# Patient Record
Sex: Female | Born: 1955
Health system: Southern US, Community
[De-identification: ages and names within clinical notes are randomized; demographics above are authoritative.]

## PROBLEM LIST (undated history)

## (undated) DIAGNOSIS — F411 Generalized anxiety disorder: Secondary | ICD-10-CM

## (undated) DIAGNOSIS — I671 Cerebral aneurysm, nonruptured: Secondary | ICD-10-CM

## (undated) DIAGNOSIS — F17200 Nicotine dependence, unspecified, uncomplicated: Secondary | ICD-10-CM

## (undated) DIAGNOSIS — K743 Primary biliary cirrhosis: Secondary | ICD-10-CM

## (undated) DIAGNOSIS — G935 Compression of brain: Secondary | ICD-10-CM

## (undated) DIAGNOSIS — G43909 Migraine, unspecified, not intractable, without status migrainosus: Secondary | ICD-10-CM

## (undated) DIAGNOSIS — F329 Major depressive disorder, single episode, unspecified: Secondary | ICD-10-CM

## (undated) DIAGNOSIS — D649 Anemia, unspecified: Secondary | ICD-10-CM

## (undated) DIAGNOSIS — T8859XA Other complications of anesthesia, initial encounter: Secondary | ICD-10-CM

## (undated) DIAGNOSIS — F419 Anxiety disorder, unspecified: Secondary | ICD-10-CM

## (undated) DIAGNOSIS — Z86010 Personal history of colonic polyps: Secondary | ICD-10-CM

## (undated) HISTORY — DX: Major depressive disorder, single episode, unspecified: F32.9

## (undated) HISTORY — DX: Cerebral aneurysm, nonruptured: I67.1

## (undated) HISTORY — DX: Personal history of colonic polyps: Z86.010

## (undated) HISTORY — DX: Primary biliary cirrhosis: K74.3

## (undated) HISTORY — DX: Generalized anxiety disorder: F41.1

## (undated) HISTORY — DX: Migraine, unspecified, not intractable, without status migrainosus: G43.909

## (undated) HISTORY — DX: Nicotine dependence, unspecified, uncomplicated: F17.200

## (undated) HISTORY — PX: APPENDECTOMY: SHX54

## (undated) HISTORY — PX: BREAST EXCISIONAL BIOPSY: SUR124

## (undated) HISTORY — DX: Anxiety disorder, unspecified: F41.9

## (undated) HISTORY — DX: Compression of brain: G93.5

## (undated) HISTORY — PX: BRAIN SURGERY: SHX531

## (undated) HISTORY — PX: TONSILLECTOMY AND ADENOIDECTOMY: SUR1326

## (undated) HISTORY — PX: KNEE ARTHROSCOPY: SHX127

---

## 1998-08-08 ENCOUNTER — Other Ambulatory Visit: Admission: RE | Admit: 1998-08-08 | Discharge: 1998-08-08 | Payer: Self-pay | Admitting: Obstetrics and Gynecology

## 2000-07-02 ENCOUNTER — Other Ambulatory Visit: Admission: RE | Admit: 2000-07-02 | Discharge: 2000-07-02 | Payer: Self-pay | Admitting: *Deleted

## 2000-11-18 ENCOUNTER — Ambulatory Visit (HOSPITAL_COMMUNITY): Admission: RE | Admit: 2000-11-18 | Discharge: 2000-11-18 | Payer: Self-pay | Admitting: *Deleted

## 2000-11-18 ENCOUNTER — Encounter: Payer: Self-pay | Admitting: *Deleted

## 2000-11-21 ENCOUNTER — Encounter: Admission: RE | Admit: 2000-11-21 | Discharge: 2000-11-21 | Payer: Self-pay | Admitting: *Deleted

## 2000-11-21 ENCOUNTER — Encounter: Payer: Self-pay | Admitting: *Deleted

## 2001-03-27 ENCOUNTER — Inpatient Hospital Stay (HOSPITAL_COMMUNITY): Admission: EM | Admit: 2001-03-27 | Discharge: 2001-03-31 | Payer: Self-pay | Admitting: Emergency Medicine

## 2001-03-28 ENCOUNTER — Encounter: Payer: Self-pay | Admitting: Neurosurgery

## 2001-03-30 ENCOUNTER — Encounter: Payer: Self-pay | Admitting: Neurosurgery

## 2001-04-15 ENCOUNTER — Inpatient Hospital Stay (HOSPITAL_COMMUNITY): Admission: RE | Admit: 2001-04-15 | Discharge: 2001-04-24 | Payer: Self-pay | Admitting: Neurosurgery

## 2001-04-15 ENCOUNTER — Encounter: Payer: Self-pay | Admitting: Neurosurgery

## 2001-04-16 ENCOUNTER — Encounter: Payer: Self-pay | Admitting: Neurosurgery

## 2001-04-20 ENCOUNTER — Encounter: Payer: Self-pay | Admitting: Neurosurgery

## 2001-04-24 ENCOUNTER — Encounter: Payer: Self-pay | Admitting: Neurosurgery

## 2001-07-07 ENCOUNTER — Encounter: Payer: Self-pay | Admitting: Neurosurgery

## 2001-07-07 ENCOUNTER — Encounter: Admission: RE | Admit: 2001-07-07 | Discharge: 2001-07-07 | Payer: Self-pay | Admitting: Neurosurgery

## 2001-11-18 ENCOUNTER — Encounter: Admission: RE | Admit: 2001-11-18 | Discharge: 2001-11-18 | Payer: Self-pay | Admitting: *Deleted

## 2001-11-18 ENCOUNTER — Encounter: Payer: Self-pay | Admitting: *Deleted

## 2001-12-10 ENCOUNTER — Encounter: Admission: RE | Admit: 2001-12-10 | Discharge: 2001-12-10 | Payer: Self-pay | Admitting: Surgery

## 2001-12-10 ENCOUNTER — Encounter (INDEPENDENT_AMBULATORY_CARE_PROVIDER_SITE_OTHER): Payer: Self-pay | Admitting: *Deleted

## 2001-12-10 ENCOUNTER — Encounter: Payer: Self-pay | Admitting: Surgery

## 2001-12-10 ENCOUNTER — Ambulatory Visit (HOSPITAL_BASED_OUTPATIENT_CLINIC_OR_DEPARTMENT_OTHER): Admission: RE | Admit: 2001-12-10 | Discharge: 2001-12-10 | Payer: Self-pay | Admitting: Surgery

## 2002-07-08 ENCOUNTER — Encounter: Payer: Self-pay | Admitting: Emergency Medicine

## 2002-07-08 ENCOUNTER — Emergency Department (HOSPITAL_COMMUNITY): Admission: EM | Admit: 2002-07-08 | Discharge: 2002-07-08 | Payer: Self-pay | Admitting: Emergency Medicine

## 2002-09-10 ENCOUNTER — Other Ambulatory Visit: Admission: RE | Admit: 2002-09-10 | Discharge: 2002-09-10 | Payer: Self-pay | Admitting: *Deleted

## 2003-07-21 ENCOUNTER — Encounter: Admission: RE | Admit: 2003-07-21 | Discharge: 2003-07-21 | Payer: Self-pay | Admitting: Obstetrics and Gynecology

## 2004-01-11 ENCOUNTER — Ambulatory Visit (HOSPITAL_COMMUNITY): Admission: RE | Admit: 2004-01-11 | Discharge: 2004-01-11 | Payer: Self-pay | Admitting: Obstetrics and Gynecology

## 2004-01-23 ENCOUNTER — Encounter (INDEPENDENT_AMBULATORY_CARE_PROVIDER_SITE_OTHER): Payer: Self-pay | Admitting: Specialist

## 2004-01-23 ENCOUNTER — Encounter (INDEPENDENT_AMBULATORY_CARE_PROVIDER_SITE_OTHER): Payer: Self-pay | Admitting: *Deleted

## 2004-01-23 ENCOUNTER — Ambulatory Visit (HOSPITAL_COMMUNITY): Admission: RE | Admit: 2004-01-23 | Discharge: 2004-01-23 | Payer: Self-pay | Admitting: Obstetrics and Gynecology

## 2004-01-25 ENCOUNTER — Observation Stay (HOSPITAL_COMMUNITY): Admission: AD | Admit: 2004-01-25 | Discharge: 2004-01-26 | Payer: Self-pay | Admitting: Obstetrics and Gynecology

## 2004-06-13 ENCOUNTER — Other Ambulatory Visit: Admission: RE | Admit: 2004-06-13 | Discharge: 2004-06-13 | Payer: Self-pay | Admitting: Obstetrics and Gynecology

## 2005-06-24 HISTORY — PX: COLONOSCOPY: SHX174

## 2006-03-12 ENCOUNTER — Other Ambulatory Visit: Admission: RE | Admit: 2006-03-12 | Discharge: 2006-03-12 | Payer: Self-pay | Admitting: Obstetrics and Gynecology

## 2006-05-09 ENCOUNTER — Encounter (INDEPENDENT_AMBULATORY_CARE_PROVIDER_SITE_OTHER): Payer: Self-pay | Admitting: *Deleted

## 2006-05-09 ENCOUNTER — Ambulatory Visit (HOSPITAL_COMMUNITY): Admission: RE | Admit: 2006-05-09 | Discharge: 2006-05-09 | Payer: Self-pay | Admitting: Gastroenterology

## 2006-05-30 ENCOUNTER — Ambulatory Visit: Payer: Self-pay | Admitting: Family Medicine

## 2006-05-30 ENCOUNTER — Encounter: Admission: RE | Admit: 2006-05-30 | Discharge: 2006-05-30 | Payer: Self-pay | Admitting: Family Medicine

## 2006-12-02 ENCOUNTER — Ambulatory Visit: Payer: Self-pay | Admitting: Family Medicine

## 2007-08-11 ENCOUNTER — Ambulatory Visit: Payer: Self-pay | Admitting: Family Medicine

## 2010-07-15 ENCOUNTER — Encounter: Payer: Self-pay | Admitting: Obstetrics and Gynecology

## 2010-11-09 NOTE — H&P (Signed)
NAME:  Elizabeth Phelps, Elizabeth Phelps                          ACCOUNT NO.:  [id number]   MEDICAL RECORD NO.:  [id number]                   PATIENT TYPE:   LOCATION:                                       FACILITY:   PHYSICIAN:  Naima A. Dillard, M.D.              DATE OF BIRTH:  [birth year]   DATE OF ADMISSION:  DATE OF DISCHARGE:                                HISTORY & PHYSICAL   DIAGNOSIS:  Right complex ovarian cyst, persistent.   HISTORY OF PRESENT ILLNESS:  The patient is a 55 year old white female,  gravida 2, para 2, who presented to me back in January 2005 with  metrorrhagia.  The patient denied the issue of bleeding disorders, history  of fibroids.  She was not on any hormone medication or any medication.  She  had tried Provera for this.  After review, the patient decided to try the  __________ IUD which was later placed. Endometrial biopsy was done which was  found to be negative.  TSH prolactin was normal.  GC and Chlamydia were  found to be within normal limits.  The patient then had an ultrasound during  her IUD placement which showed a right complex ovarian cyst was found to be  1.6 cm.  She did have another ultrasound in March 2005 which showed that the  IUD was still in place and the right ovarian cyst was still present, but 1.4  cm.  Then she had an ultrasound at [facility] on January 11, 2004 which  showed a 1.3 x 1.6 x 1.2 follicle on the right ovary.  Normal uterus and  left ovary.  After review of that ultrasound, it still looked like it might  have some complexity.  I explained to the patient.  The patient also had a  CA125 which was normal measuring 3.7.  I explained to the patient this is  probably just a normal follicle and IUD does not stop you from ovulating.  She still wanted to have a laparoscopy to look at this to rule out any  evidence of borderline ovarian cancer.   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   PAST SURGICAL HISTORY:  1. Attempted operation on the aneurysm.  2. Tonsillectomy and adenoidectomy.  3. Appendectomy.   PAST OB HISTORY:  Significant for vaginal delivery x2 without any  complications.   MEDICATIONS:  Paxil 25 mg every day.   ALLERGIES:  The patient states she has no known drug allergies; however, a  past report says she may be allergic to CODEINE.   FAMILY HISTORY:  Unremarkable for diabetes, hypertension, or breast or  ovarian cancer.   REVIEW OF SYSTEMS:  NEUROLOGIC:  History of fusiform tumor with no sequelae.  ENDOCRINE:  Unremarkable.  GI:  Unremarkable.  CARDIAC:  Unremarkable.  GU:  As above.  PSYCHIATRIC:  The patient has a little bit of  anxiety and is on  Paxil.   SOCIAL HISTORY:  She does smoke one pack a day of tobacco.  Denies any  alcohol or illicit drug use.   PHYSICAL EXAMINATION:  GENERAL:  She weighs 136 pounds.  VITAL SIGNS:  Blood pressure 110/60.  HEENT:  Pupils are equal.  Hearing is normal.  Throat is clear.  Thyroid is  not enlarged.  HEART:  Regular rate and rhythm.  LUNGS:  Clear to auscultation bilaterally.  BREASTS:  No masses, discharge, skin changes, or nipple retraction  bilaterally.  BACK:  No CVA tenderness bilaterally.  ABDOMEN:  Nontender without any masses or organomegaly, palpated.  EXTREMITIES:  No cyanosis, clubbing or edema.  NEUROLOGIC:  Within normal limits.  PELVIC:  Vaginal within normal limits.  Cervical is nontender without any  lesions.  Uterus is normal size and consistency.  Adnexa nontender  bilaterally.   LABORATORY DATA:  On ultrasound her IUD is in the canal.  The measures 8.5 x  3.6 x 4.8 cm. Left ovary is measured 1.9 x 2.1 x 2 cm with a small  physiologic cyst.  Small physiologic follicle __________ in the right ovary  measures 2.5 x 1.8 x 2.2 with follicle versus a complex cyst.   ASSESSMENT:  Right ovarian follicle versus complex cyst.   PLAN:  Diagnostic laparoscopy __________  operative laparoscopy, cystectomy,  and possible oophorectomy.  The patient understands the risks are but not  limited to bleeding, infection, damage to internal organs such as bowel,  bladder, major blood vessels.  I spoke with Dr. Newell Coral in terms of the  risk of the patient having surgery with this inoperable brain aneurysm and  he said that we do not know what the course of this inoperable brain  aneurysm would be.  Surgery probably has little risk as long as her blood  pressure is well controlled by anesthesia.  The patient was given a  preoperative anesthesia consult.  She understands that risk as well.  She  understands that her other options are observation and just monitor the cyst  with ultrasound and surgery.  The patient decides to proceed with surgery  and she understands the risks at hand.                                               Naima A. Normand Sloop, M.D.    NAD/MEDQ  D:  01/22/2004  T:  01/22/2004  Job:  [id number]

## 2010-11-09 NOTE — Discharge Summary (Signed)
[place]. [facility]  Patient:    Elizabeth Phelps, Elizabeth Phelps Visit Number: [id number] MRN: [id number]          Service Type: SUR Location: 3000 3019 01 Attending Physician:  Barton Fanny Dictated by:   Hewitt Shorts, M.D. Admit Date:  03/27/2001 Discharge Date: 03/31/2001   CC:         Milon Dikes, M.D.   Discharge Summary  HISTORY OF PRESENT ILLNESS:  The patient is a 55 year old woman who presented with a two-year history of intermittent headaches which have increased over the past couple of months.  They have typically been located temporally either on one side or the other, or in fact, on both sides at times.  She underwent workup last week with MRI and subsequently MRA which revealed a right middle cerebral artery aneurysm, and neurosurgical consultation was requested.  PAST MEDICAL HISTORY:  Notable for a remote history of peptic ulcer disease but is otherwise unremarkable.  FAMILY HISTORY:  Notable for father having had a subarachnoid hemorrhage documented by LP more than 30 years ago.  However, cerebral arteriography did not demonstrate a vascular lesion.  PHYSICAL EXAMINATION:  Subtle neck.  Stable vital signs.  General examination was unremarkable.  Neurological examination showed a patient who was neurologically intact.  HOSPITAL COURSE:  The patient was admitted, and a four-vessel cerebral arteriogram was performed which revealed a large 14 x 11 mm right M1 aneurysm with a fusiform dilation of the subsequent distal vessel.  The decision was made to continue the patients hydration and to subsequently obtain a CT arteriogram.  This was done yesterday and demonstrated that the __________ aneurysm is not associated with a vascular branch point, and the dome of it overlies the ________ of the sphenoid.  It appears from the CT that the major perforators off the proximal M1 are in fact proximal to the aneurysm.  The findings of the  studies have been discussed with the patient and her family including her husband and parents.  Further, I have had the opportunity to review her studies with my partners as well as with Dr. ______ ______, interventional neural radiologist.  Everyone involved favors surgical repair with clipping of the aneurysm and essentially reconstruction of the he distal M1 segment.  However, no one favors reconstruction of the distal fusiform arterial dilation.  We spoke at length with the patient and her husband about the nature of surgery and its associated risks including risks of infection, bleeding, possibility of a transfusion, and risk of neurologic dysfunction including memory and/or personality changes, paralysis, coma, and death, the risk of requiring further surgery, postoperative hematoma, hydrocephalus, and so on, and anesthetic risk of myocardial infarction, stroke, pneumonia, and death.  We discussed the likelihood of both major and minor complications and also discussed the natural history of cerebral aneurysms, and their associated risks.  After discussing all of this, she does wish to proceed with surgery. We will plan on readmitting her for surgery on April 16, 2001.  The patient was given a prescription for Vicodin one tablet q.i.d. p.r.n. bad headache #40 tablets, no refills.  She is going to be allowed to return to work.  However, she is to avoid stressful situations.  If she has any difficulty, she is to let us know.  She understands that there is a small risk or hemorrhage from this aneurysm over the next couple of weeks but does understand the plans for treatment.  Their questions were answered.  No  office follow-up appointment will be scheduled prior to surgery unless any problems arise.  She does understand that we will be treating her perioperatively and postoperatively with prophylactic anticonvulsants.  DISCHARGE DIAGNOSES: 1. Un-ruptured but probably symptomatic  (headache) right middle cerebral    artery aneurysm. 2. Headache. 3. Remote history of peptic ulcer disease. Dictated by:   Hewitt Shorts, M.D. Attending Physician:  Barton Fanny DD:  03/31/01 TD:  03/31/01 Job: [id number]

## 2010-11-09 NOTE — Discharge Summary (Signed)
[place]. [facility]  Patient:    Elizabeth Phelps, Elizabeth Phelps Visit Number: [id number] MRN: [id number]          Service Type: SUR Location: 3000 3009 01 Attending Physician:  Barton Fanny Dictated by:   Hewitt Shorts, M.D. Admit Date:  04/15/2001 Discharge Date: 04/24/2001                             Discharge Summary  ADMISSION HISTORY AND PHYSICAL EXAMINATION:  The patient is a 55 year old woman who early on the month of admission was found to have a right middle cerebral artery aneurysm.  She had had headaches x 2 years which had been worsening and the lesion was found on MRI and MRA.  She underwent cerebral arteriography and CT arteriography and was admitted for craniotomy and clipping of the aneurysm.  General examination was unremarkable and neurologic examination was intact.  HOSPITAL COURSE:  Patient was admitted.  We had planned to perform an intraoperative arteriogram and, therefore, the patient first went to the angiogram suite and a long neurovascular sheath was placed.  The patient was then brought to the operating room and underwent right ______ craniotomy. Unfortunately, the aneurysm was unclippable.  The lesion was found to, in fact, be a complex fusiform aneurysm of the M1 segment.  The dilation of the M1 segment folded back on itself 180 degrees twice, such that it had an S-shape and could not be reconstructed.  All attempted at clipping obliterated distal flow and this was documented by intraoperative arteriography by Dr. Corliss Skains, further in the mid section of the fusiform aneurysm were three perforating vessels which were left intact.  Postoperatively, the patient was returned to the intensive care unit and subsequently extubated.  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  For the first several days, she did not speak.  She was slow to open her eyes, but gradually she has  improved and has become increasingly awake, alert, and conversant.  She is oriented in following commands and moving all four extremities wall.  She is up and ambulating in the halls.  Her wound has healed nicely.  A followup CT scan has shown typical evolution of her lenticular strike CVA.  It is planned to obtained further consultation as an outpatient to determine whether vascular reconstruction or endovascular stenting may be reasonable approaches for this unclippable aneurysm.  The patient is to follow up in my office in two to three weeks.  At this point, she is using very little for discomfort, typically Tylenol. Advil, or Aleve.  She was on Decadron and it was tapering during the hospitalization. She is being discharged to home with instructions regarding wound care and activities.  Her staples were removed on the day of discharge.  DISCHARGE DIAGNOSES:  Unclippable complex fusiform right M1 aneurysm. Dictated by:   Hewitt Shorts, M.D. Attending Physician:  Barton Fanny DD:  04/24/01 TD:  04/25/01 Job: [id number]

## 2010-11-09 NOTE — H&P (Signed)
[place]. [facility]  Patient:    Elizabeth Phelps, Elizabeth Phelps Visit Number: [id number] MRN: [id number]          Service Type: SUR Location: 3100 3114 01 Attending Physician:  Barton Fanny Dictated by:   Hewitt Shorts, M.D. Admit Date:  03/27/2001   CC:         Ronnald Nian, M.D.   History and Physical  HISTORY OF PRESENT ILLNESS:  The patient is a 55 year old right-handed white female who has had a two-year history of intermittent headaches.  They have been more frequent over the past couple of months, occurring at this point four to five times per week.  They are typically temporal in location, occurring on either temporal region or bitemporally.  She has had no recent sudden severe explosive headaches, but she has occasionally in the past had explosive headaches that she describes as if her head was about to come off.  Because of the increased headaches she consulted with her primary physician, Dr. Susann Givens.  He treated her with amoxicillin for presumed sinus disease. When that did not help, she was treated with amitriptyline, and it was decided to go ahead with an MRI scan.  This was performed two days ago.  She had not heard back yet earlier today about the results, so she contacted Dr. Levonne Spiller office, and she was then recommended to undergo MRA.  That was performed earlier this evening.  The results were called to Dr. Talmadge Coventry, who was covering for Dr. Susann Givens.  She was told that a right middle cerebral artery aneurysm was demonstrated, and neurosurgical consultation was requested, and the patient was referred to the [facility] Emergency Room for neurosurgical consultation.  The patient says that at time she feels with her headache she can feel her heart beating in her temple.  She said occasionally with the headache she will have some nausea, although she currently has no nausea, and she has not had any vomiting.  She  denies any diplopia or blurred vision, any language difficulties, paralysis, weakness, or seizures.  FAMILY HISTORY:  Notable for her father having had a subarachnoid hemorrhage documented by lumbar puncture 30 or more years ago.  He apparently underwent cerebral arteriography, both of the brachial artery and direct carotid artery puncture, and no aneurysm was demonstrated.  He was treated conservatively and has had no further difficulties.  Her mother has a history of hypertension. Her father has a history of prostate cancer as well as the history of subarachnoid hemorrhage.  Notably, when I spoke with the patients father, he explained that he presented with severe back pain and nuchal pain and had the lumbar puncture that demonstrated the subarachnoid hemorrhage but, again, his arteriography, apparently, did not demonstrate an aneurysm.  PAST MEDICAL HISTORY: 1. History of peptic ulcer disease that has previously been evaluated by    Dr. Lina Sar.  She uses Pepcid on a p.r.n. basis and primarily treats it    with diet control. 2. No history of hypertension, heart disease, myocardial infarction, cancer,    diabetes, lung disease, or previous diagnosis of stroke.  PAST SURGICAL HISTORY: 1. Tonsillectomy. 2. Appendectomy. 3. Right knee arthroscopy.  ALLERGIES:  None to medications.  CURRENT MEDICATIONS:  Her only current medication is amitriptyline, which she has only been taking for the past week.  SOCIAL HISTORY:  The patient is married.  She works two jobs.  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,  a Patent attorney.  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  She has children, specifically two sons, ages 23 and 63.  She smokes less than a pack a day.  She has been smoking for 30 years.  She does not drink alcoholic beverages.  She denies history of substance abuse.  REVIEW OF SYSTEMS:  Notable for that described in her history of  present illness and past medical history.  It is otherwise unremarkable.  PHYSICAL EXAMINATION:  GENERAL:  Well-developed, well-nourished white female in no acute distress.  VITAL SIGNS:  Temperature 99.3, pulse 100, blood pressure 133/85, respiratory rate 22.  NECK:  Supple.  LUNGS:  Clear to auscultation.  Symmetrical respiratory excursion.  HEART:  Regular rate and rhythm.  No S1 or S2.  There is no murmur.  ABDOMEN:  Soft, nondistended.  Bowel sounds are present.  EXTREMITIES:  No clubbing, cyanosis, or edema.  NEUROLOGIC:  Mental status:  Patient awake, alert.  She is fully oriented. Her speech is full.  She has good comprehension.  Cranial nerves show her pupils to be equal, round, and reactive to light and about 3 mm bilaterally. Extraocular movements are intact.  Facial sensation is intact.  Facial movement is symmetrical.  Hearing is intact bilaterally.  ______ movement is symmetrical.  Shoulder shrug is symmetrical.  Tongue protrudes in midline ______ to either side.  Motor examination shows 5/5 strength.  She has no drift to the upper extremities.  Sensation is intact to pinprick throughout the extremities.  Reflexes are 2+ and symmetrical in the upper and lower extremities.  Toes are downgoing bilaterally.  LABORATORY DATA:  MRI and MRA from [facility] performed this week were reviewed.  The studies are indicative of right middle cerebral artery aneurysm.  No other obvious lesions are seen.  IMPRESSION:  Unruptured right middle cerebral artery aneurysm in a patient with a two-year history of intermittent headaches which have been worse over the past couple of months; notably, the headaches are temporal in location, and she describes as if she can feel her heartbeat through the temples. However, there is clinically no evidence of subarachnoid hemorrhage.  PLAN:  The patient will be admitted to the neurosurgical intensive care unit for care.  Cerebral  arteriography will be obtained and, most likely, the  patient will require craniotomy for clipping of her aneurysm.  I had the opportunity to speak with the patients husband, the patient herself, her children, and her parents about her condition and our recommendations for evaluation and treatment.  Their questions were answered. I will be able to provide further recommendations once the arteriogram is completed. Dictated by:   Hewitt Shorts, M.D. Attending Physician:  Barton Fanny DD:  04/27/01 TD:  03/28/01 Job: [id number]

## 2010-11-09 NOTE — Op Note (Signed)
NAME:  Elizabeth Phelps, Elizabeth Phelps                        ACCOUNT NO.:  [id number]   MEDICAL RECORD NO.:  [id number]                   PATIENT TYPE:  AMB   LOCATION:  SDC                                  FACILITY:  [facility]   PHYSICIAN:  Naima Phelps. Dillard, M.D.              DATE OF BIRTH:  04/11/1956   DATE OF PROCEDURE:  01/23/2004  DATE OF DISCHARGE:                                 OPERATIVE REPORT   PREOPERATIVE DIAGNOSIS:  Right ovarian follicle versus complex ovarian cyst.   POSTOPERATIVE DIAGNOSIS:  Right ovarian cyst, consistent with Phelps follicular  cyst and some endometriosis on frozen section.   PROCEDURE:  Operative laparoscopy, right ovarian cystectomy with frozen  section and left tube biopsy.   ANESTHESIA:  General endotracheal tube.   ESTIMATED BLOOD LOSS:  Minimal.   INTRAVENOUS FLUIDS:  1200 mL.   URINE OUTPUT:  100 clear urine at the end of procedure.   SURGEON:  Naima Phelps. Normand Sloop, M.D.   FIRST ASSISTANT:  Henreitta Leber, P.Phelps.-C.   FINDINGS:  Normal sized uterus with right ovarian cyst with evidence of  endometriosis on frozen section.  Normal-appearing left ovary, left tube,  with multiple hydatid of Morgagni, clubbed right tube in pelvic cul-de-sac  with stigmata of endometriosis.  It had some white kind of scarring on the  right uterosacral ligament and one small adhesion in the cul-de-sac.   COMPLICATIONS:  None.   PROCEDURE IN DETAIL:  The patient was taken to operating room where she was  given general anesthesia, placed in the dorsal lithotomy position, prepped  and draped in the normal sterile fashion.  Phelps sponge on Phelps stick was placed  into the vagina.  Phelps 10 mm infra-umbilical incision was then made with the  scalpel.  Phelps Veress was placed __________ abdominal wall at Phelps 45 degree angle  until driver placement was confirmed with fluid-filled syringe and decreased  in CO2 pressure.  The abdomen was insufflated with 4 L of CO2 gas.  Phelps 10 mm  trocar was placed.   Intra-abdominal placement was confirmed with  laparoscope.  The findings noted above were seen.  The patient also had  normal-appearing gallbladder.  There was some scarring consistent with maybe  __________ along the liver but the liver overall looked normal.  There was  some scar tissue in the right lower quadrant which would be consistent with  appendectomy, 5 mL of 0.25% Marcaine was placed in 2 cm above the symphysis  pubis and Phelps 2 mm suprapubic incision was then made with the scalpel and Phelps 5  mm trocar was placed under direct visualization of the laparoscope.  The  patient's right utero-ovarian ligament was grasped.  The cyst was then  lanced and removed with forceps and scissors.  The ovarian bed was then  irrigated and made hemostatic with Bovie cautery.  Hemostasis was assured.  One of the cysts on her left fallopian tube  was biopsied.  It had some  bleeding which was made hemostatic with Bovie cautery.  Hemostasis was  assured.  All areas of any gross endometrium in place were removed without  difficulty.  The cyst was sent for frozen section and noted to be some  endometriosis and right ovarian functional cyst.  Pelvic washings were also  obtained.  All instruments were removed from the abdomen under direct  visualization of the laparoscope.  The infraumbilical fascia was closed with  0 Vicryl.  The skin was closed with 3-0 Monocryl in Phelps subcuticular fashion.  The sponge stick was removed from the vagina.  Sponge, lap, and needle  counts were correct x2.  The patient sent to recovery room in stable  condition.                                               Naima Phelps. Normand Sloop, M.D.    NAD/MEDQ  D:  01/23/2004  T:  01/23/2004  Job:  [id number]

## 2010-11-09 NOTE — H&P (Signed)
[place]  Patient:    Elizabeth Phelps, Elizabeth Phelps Visit Number: [id number] MRN: [id number]          Service Type: SUR Location: 3100 3110 01 Attending Physician:  Barton Fanny Dictated by:   Hewitt Shorts, M.D. Admit Date:  04/15/2001   CC:         Hewitt Shorts, M.D.  Ronnald Nian, M.D.   History and Physical  HISTORY OF PRESENT ILLNESS:  This is a readmission of a 55 year old right-handed white female who was admitted earlier this month for a right middle cerebral artery aneurysm.  She had headaches for two years, which have been worsening, and MRI and MRA had been obtained which revealed a right middle cerebral artery aneurysm.  The patient underwent cerebral arteriogram and CT arteriogram and plans were made for readmission and definitive treatment via craniotomy and clipping of the aneurysm with planned postclipping intraoperative cerebral arteriography.  PAST MEDICAL HISTORY:  In March 27, 2001 admission note.  PAST SURGICAL HISTORY:  In March 27, 2001 admission note.  ALLERGIES:  In March 27, 2001 admission note.  MEDICATIONS:  In March 27, 2001 admission note.  SOCIAL HISTORY:  In March 27, 2001 admission note.  REVIEW OF SYSTEMS:   In March 27, 2001 admission note.  PHYSICAL EXAMINATION:  GENERAL:  Well-developed, well-nourished white female in no acute distress.  VITAL SIGNS:  Temperature 98, pulse 78, blood pressure 111/64, respiratory rate 16.  Height 5 foot 3-1/2 inches, weight 135 pounds.  LUNGS:  Clear to auscultation.  She has symmetrical breath sounds.  HEART:  Regular rate and rhythm.  S1 and S2.  There is no murmur.  ABDOMEN:  Soft.  Bowel sounds are present.  EXTREMITIES:  No clubbing, cyanosis, or edema.  NEUROLOGICAL:  The patient is awake, alert, and fully oriented.  Speech is fluent.  She has good comprehension.  Cranial nerves are intact.  Motor examination shows 5/5 strength.   There is no drift.  Sensation is intact. Reflexes are 2+ and symmetrical.  IMPRESSION:  Unruptured right middle cerebral artery aneurysm in a patient with a two-year history of intermittent headaches, which have worsened over the proceeding couple of months.  Arteriogram on CAT demonstrated a 14 by 11 mm right N1 aneurysm with a fusiform dilation of the subsequent distal vessel.  PLAN:  The patient will be admitted for craniotomy and clipping of the aneurysm which will essentially involve reconstruction of the distal N1 segment.  We discussed the nature of the surgical procedure and its associated risk including risk of infection, bleeding, possible need for transfusion, risk of neurologic dysfunction including memory and/or personality changes, paralysis, coma, and death.  The risk of possibly requiring further surgery with postoperative hematoma, hydrocephalus, and anesthetic risk with myocardial infarction, stroke, pneumonia and death.  Having discussed the likelihood of both major and minor complications and having discussed the natural history of cerebral aneurysm and their associated risks, the patient does wish to proceed with surgery and is admitted for such. ictated by:   Hewitt Shorts, M.D. Attending Physician:  [place] Fanny DD:  04/15/01 TD:  04/16/01 Job: [id number]

## 2010-11-09 NOTE — Op Note (Signed)
[place]  Patient:    Elizabeth Phelps, Elizabeth Phelps Visit Number: [id number] MRN: [id number]          Service Type: SUR Location: 3100 3110 01 Attending Physician:  Barton Fanny Dictated by:   Hewitt Shorts, M.D. Proc. Date: 04/15/01 Admit Date:  04/15/2001                             Operative Report  PREOPERATIVE DIAGNOSIS:  Right middle cerebral artery aneurysm.  POSTOPERATIVE DIAGNOSIS:  Right middle cerebral artery aneurysm.  OPERATION PERFORMED:  Right pterional craniotomy and attempted clipping of aneurysm with intraoperative angiography by Julieanne Cotton, M.D.  SURGEON:  Hewitt Shorts, M.D.  ASSISTANT: 1. Mena Goes. Franky Macho, M.D. 2. Payton Doughty, M.D.  ANESTHESIA:  General endotracheal.  INDICATIONS FOR PROCEDURE:  The patient is a 55 year old woman who was found by MRI and MRA to have a right middle cerebral artery aneurysm.  She had presented with increasing headaches over the past two years.  She underwent cerebral arteriography and CT angiography.  She was felt to not be a candidate for endovascular coiling and therefore decision was made to proceed with craniotomy and clipping of the aneurysm.  INTRAOPERATIVE FINDINGS:  The aneurysm and the entering and exiting vessels were identified.  The aneurysm itself was not saccular in nature but rather fusiform taking two nearly 180 degree turns through its fusiform dilation.  In the midportion of the fusiform dilation three perforators exited  from its wall.  A number of attempts were made to clip the aneurysm; however, with each attempt, flow into the distal exiting vessel was blocked.  This was determined by intraoperative Doppler and confirmed by intraoperative arteriography.  In the end after intraoperative review by myself, Dr. Franky Macho and Dr. Channing Mutters it was felt that this aneurysm was not clippable and therefore the procedure was concluded.  DESCRIPTION OF  PROCEDURE:  Prior to arriving in the preoperative area, the patient was seen in the arteriography suite in radiology and a long sheath was placed for planned intraoperative arteriography. This was flushed with the solution specified by Dr. Corliss Skains and the patient was brought to the neurosurgical preoperative area, prepared for surgery and then brought to the operating room and placed under general endotracheal anesthesia.  The head was then placed in a three-pin [place] radiolucent headholder, a roll was placed beneath the right shoulder, the head was gently turned to the left and secured with a radiolucent frame.  The right pterional scalp was shaved and then prepped with Betadine soap and solution and draped in a sterile fashion.  A curvilinear right pterional incision was made and the line of incision was infiltrated with local anesthetic without epinephrine prior to skin incision. The scalp flap was reflected and then an anterior fascial muscular flap was done so as to be able to reflect the temporalis muscle inferoposteriorly yet preserve the frontal branch of the facial nerve.  We then proceeded with the craniotomy using the [facility] Max drill to make two bur holes.  The dura was dissected from the overlying skull and then the craniotome attachment was used to turn the bone flap, the ____________ of the sphenoid was drilled away and the bone flap was then mobilized under loupe magnification.  We further removed the ____________ of the sphenoid so as to improve our intradural exposure and then the dura was opened in a curvilinear fashion hinged towards the  pterion.  The microscope was draped and brought into the field and allowed [place] to have excellent magnification and illumination and the remainder of the procedure was performed using microdissection and microsurgical technique. The Buddy halo self-retaining retractor system was assembled and we began the intradural exposure with  opening of the sylvian fissure.  Dissection was carried out proximally.  The arachnoid adhesions were opened.  We identified the distal vessel that was exiting from the aneurysm.  We dissected further proximally and then we retracted the frontal lobe further, identified the optic nerve, the right internal carotid artery and opened up the carotid cisterns.  We then were able to expose what appeared to be the dome of the aneurysm and we were able to further define the proximal M1 segment, the aneurysm itself and the distal M1 segment.  As we dissected and exposed, it became more apparent that the aneurysm itself was a fusiform dilation of the M1 segments.  The proximal M1 segment which appeared normal in shape and size as it entered into the fusiform aneurysm the vessel made a nearly 180 degree turn and then subsequently made a second 180 degree turn back and gradually narrowed to a mildly fusiform dilation and then more distally to a more normal-appearing vessel.  From the fusiform dilation arose three perforating vessels.  A variety of Sugita titanium aneurysm clips were used for attempted clipping of the aneurysm. These were placed in a variety of fashions.  We used clips including gently curved clips, a right angle clip and a right angle clip with a curved blade as well as a 45 degree angled clip.  After each attempted clipping in which care was taken to leave the perforating vessels patent and to leave the lumen of the middle cerebral artery patent, we found that although there was Doppler flow proximally, there was no distal Doppler flow in the distal M1 segments.  We did further confirm this with intraoperative cerebral arteriography  which showed while one of the aneurysm clips was on and although under direct observation it appeared that the vessel lumen should still be patent, that in fact there was no flow distally and this was confirmed both by Doppler and angiography.  The clip  was removed. The patient was re-angioed and good flow through the middle cerebral artery was noted with filling of the fusiform aneurysm.   Notably the patient was premedicated prior to the aneurysm clipping with 40 gm of mannitol and a large dose of pentothal for cerebral protection.  In addition to trying a variety of aneurysm clip shapes and sizes, we tried a variety of different trajectories of placing the clip so as to maintain the middle cerebral arterys lumen.  However, each attempt was unsuccessful.  In the end it was felt that this aneurysm represented a complex fusiform aneurysm and not a saccular aneurysm.  Further with each attempted aneurysm clipping although there appeared to be sufficient vessel remaining, clearly there was no flow distal to the point of aneurysm clipping and it was felt that the walls of the vessel collapsed closed with the aneurysm clipping.  We therefore feel that this aneurysm is not clippable.  We did discuss with Dr. Corliss Skains the possibility of endovascular stenting and he is going to look into this possibility.  We therefore proceeded with completion of the surgery and closure.  The wound was irrigated extensively with saline solution. Hemostasis intradurally was established and then the dura was closed with interrupted 4-0 Nurolon sutures.  The dura was tacked up around the margins of the craniotomy with multiple 4-0 Nurolon sutures and the bone flap was secured in place with four medium-sized Osteomed plates using 5 mm screws.  The temporalis fascia was closed with interrupted 2-0 undyed Vicryl sutures.  The galea was closed with interrupted inverted 2-0 undyed Vicryl sutures and the skin edges were closed with surgical staples.  The wound was dressed with Adaptic and sterile gauze and the head was wrapped with Kling and Kerlix.  The patient tolerated the procedure well.  Estimated blood loss was 200 cc. Sponge, needle and instrument counts were  correct.  Following surgery, the patient was not reversed from the anesthetic due to an inability to reverse her neuromuscular blockade and a relatively cold temperature of 33 degrees centigrade.  The patient was therefore transferred to the neurosurgical recovery room intubated to be placed on a ventilator for further care. Dictated by:   Hewitt Shorts, M.D. Attending Physician:  [place] Fanny DD:  04/15/01 TD:  04/16/01 Job: [id number]

## 2010-11-09 NOTE — Op Note (Signed)
[name], Elizabeth Phelps              ACCOUNT NO.:  [id number]   MEDICAL RECORD NO.:  [id number]          PATIENT TYPE:  AMB   LOCATION:  ENDO                         FACILITY:  [facility]   PHYSICIAN:  Anselmo Rod, M.D.  DATE OF BIRTH:  1955/10/22   DATE OF PROCEDURE:  05/09/2006  DATE OF DISCHARGE:  05/09/2006                                 OPERATIVE REPORT   PROCEDURE PERFORMED:  Colonoscopy with snare polypectomy x2.   ENDOSCOPIST:  Anselmo Rod, MD   INSTRUMENT USED:  Olympus video colonoscope.   INDICATIONS FOR PROCEDURE:  55 year old female undergoing screening  colonoscopy to rule out colonic polyps, masses, etc..   PROCEDURE:  Preparation informed consent was procured from the patient.  The  patient was fasted for 8 hours prior to the procedure and prepped with a  gallon of Trilyte the night prior to procedure.  Risks and benefits of the  procedure including a 10% miss rate of cancer and polyp were discussed with  the patient as well.   PREPROCEDURE PHYSICAL:  Patient stable vital signs.  Neck supple.  Chest  clear to auscultation.  S1, S2 regular.  Abdomen soft with normal bowel  sounds.   DESCRIPTION OF PROCEDURE:  The patient was placed in the left lateral  decubitus position and sedated with 100 mcg of fentanyl and 10 mg of Versed  in slow incremental doses.  Once the patient was adequately sedated and  maintained on low-flow oxygen and continuous cardiac monitoring the Olympus  video colonoscope was advanced from the rectum to the cecum.  Multiple  washes were done.  The appendiceal orifice and ileocecal valve were clearly  visualized and photographed.  A small sessile polyp was snared from the  rectum (hot snare times one) another 5 to 6 mm polyp was snared from 40 cm.  Another small sessile polyp was biopsied from the mid right colon (cold  biopsies times one) the rest of exam was unremarkable.  There was no  evidence of diverticulosis.  Retroflexion in the  rectum revealed no  abnormalities.  The patient tolerated the procedure well without immediate  complications.   IMPRESSION:  1. Two polyps snared, one from the rectum and one from 40 cm (hot snare      x2).  2. Small sessile polyp biopsied from the mid right colon (cold biopsies).  3. No evidence of diverticulosis.  4. No abnormalities noted on retroflexion in the rectum.   RECOMMENDATIONS:  1. Await pathology results.  2. Repeat colonoscopy depending on pathology results.  3. Avoid all nonsteroidals including aspirin for the next two weeks.  4. Outpatient follow-up as need arises in the future.      Anselmo Rod, M.D.  Electronically Signed     JNM/MEDQ  D:  05/13/2006  T:  05/13/2006  Job:  [id number]   cc:   Naima A. Normand Sloop, M.D.  Sharlot Gowda, M.D.

## 2010-11-09 NOTE — Op Note (Signed)
[place]. [facility]  Patient:    Elizabeth Phelps, Elizabeth Phelps Visit Number: [id number] MRN: [id number]          Service Type: DSU Location: [facility] Attending Physician:  Bonnetta Barry Dictated by:   Velora Heckler, M.D. Proc. Date: 12/10/01 Admit Date:  12/10/2001 Discharge Date: 12/10/2001   CC:         Ronnald Nian, M.D.  Hewitt Shorts, M.D.  [facility]   Operative Report  PREOPERATIVE DIAGNOSIS:  Abnormal mammogram.  POSTOPERATIVE DIAGNOSIS:  Abnormal mammogram.  PROCEDURE:  Right breast excisional biopsy with wire localization.  SURGEON:  Velora Heckler, M.D.  ANESTHESIA:  Intravenous sedation with local.  ESTIMATED BLOOD LOSS:  Less than 25 cc.  PREPARATION:  Betadine.  COMPLICATIONS:  None.  INDICATIONS:  The patient is a 55 year old white female, found to have abnormality on routine screening mammogram.  Six month follow up shows a sclerosing lesion in the upper mid right breast.  Biopsy was recommended.  The patient now comes to surgery for excision with wire localization.  DESCRIPTION OF PROCEDURE:  The procedure was done in OR #4 at the [facility].  The patient was brought to the operating room and placed in the supine position on the operating room table.  Following the administration of intravenous sedation, the patient was prepped and draped in the usual strict aseptic fashion.  The skin around the site of guidewire insertion is anesthetized with local anesthetic.  A curvilinear incision was made with a #15 blade.  Dissection was carried down to the subcutaneous tissues and hemostasis obtained with the electrocautery.  Using the electrocautery and sharp dissection, a core of tissue was excised around the guidewire so that it is completely excised.  The mass is somewhat fibrotic and quite dense.  It is submitted for specimen mammography which confirms that the lesion in question is present within  the specimen.  Good hemostasis was obtained throughout the wound with the electrocautery.  The skin edges were reapproximated with interrupted 4-0 Vicryl subcuticular sutures.  Local field block was placed. The wound was washed and dried, and Benzoin and Steri-Strips were applied. Sterile gauze dressings are applied.  The patient is returned to the recovery room in stable condition.  The patient tolerated the procedure well. Dictated by:   Velora Heckler, M.D. Attending Physician:  Bonnetta Barry DD:  12/10/01 TD:  12/12/01 Job: [id number]

## 2010-12-01 ENCOUNTER — Emergency Department (HOSPITAL_COMMUNITY): Payer: Self-pay

## 2010-12-01 ENCOUNTER — Emergency Department (HOSPITAL_COMMUNITY)
Admission: EM | Admit: 2010-12-01 | Discharge: 2010-12-02 | Disposition: A | Discharge status: Home / Self Care | Payer: Self-pay | Attending: Emergency Medicine | Admitting: Emergency Medicine

## 2010-12-01 DIAGNOSIS — R0602 Shortness of breath: Secondary | ICD-10-CM | POA: Insufficient documentation

## 2010-12-01 DIAGNOSIS — J40 Bronchitis, not specified as acute or chronic: Secondary | ICD-10-CM | POA: Insufficient documentation

## 2010-12-01 DIAGNOSIS — R079 Chest pain, unspecified: Secondary | ICD-10-CM | POA: Insufficient documentation

## 2010-12-01 DIAGNOSIS — R000 Tachycardia, unspecified: Secondary | ICD-10-CM | POA: Insufficient documentation

## 2010-12-01 DIAGNOSIS — R062 Wheezing: Secondary | ICD-10-CM | POA: Insufficient documentation

## 2010-12-01 LAB — GLUCOSE, CAPILLARY: Glucose-Capillary: 102 mg/dL — ABNORMAL HIGH (ref 70–99)

## 2011-01-29 ENCOUNTER — Encounter: Payer: Self-pay | Admitting: Family Medicine

## 2011-01-29 ENCOUNTER — Ambulatory Visit (INDEPENDENT_AMBULATORY_CARE_PROVIDER_SITE_OTHER): Payer: Self-pay | Admitting: Family Medicine

## 2011-01-29 VITALS — BP 126/80 | HR 85 | Wt 147.0 lb

## 2011-01-29 DIAGNOSIS — F32A Depression, unspecified: Secondary | ICD-10-CM

## 2011-01-29 DIAGNOSIS — F329 Major depressive disorder, single episode, unspecified: Secondary | ICD-10-CM

## 2011-01-29 DIAGNOSIS — F3289 Other specified depressive episodes: Secondary | ICD-10-CM

## 2011-01-29 MED ORDER — PAROXETINE HCL 20 MG PO TABS: 20.0000 mg | ORAL_TABLET | ORAL | Status: DC

## 2011-01-29 NOTE — Progress Notes (Signed)
  Subjective:    Patient ID: Elizabeth Phelps, female    DOB: [birth year], 55 y.o.   MRN: [id number]  HPI She is here after a several year hiatus. She was in [place] for several years and is now back in town. At this time she does not have insurance. She continues on Paxil and has been out for 2 days. She is to have some withdrawal symptoms.   Review of Systems     Objective:   Physical Exam Alert and in no distress with appropriate affect       Assessment & Plan:  Depression Continue on Paxil. Discussed setting up for complete exam.

## 2011-08-15 ENCOUNTER — Telehealth: Payer: Self-pay | Admitting: Internal Medicine

## 2011-08-15 MED ORDER — PAROXETINE HCL 20 MG PO TABS: 20.0000 mg | ORAL_TABLET | ORAL | Status: DC

## 2011-08-15 NOTE — Telephone Encounter (Signed)
Paxil renewed 

## 2011-09-02 ENCOUNTER — Telehealth: Payer: Self-pay | Admitting: Internal Medicine

## 2011-09-02 NOTE — Telephone Encounter (Signed)
She apparently has had several syncopal episodes. I will have her make an appointment for followup.

## 2011-09-03 ENCOUNTER — Ambulatory Visit (INDEPENDENT_AMBULATORY_CARE_PROVIDER_SITE_OTHER): Payer: Self-pay | Admitting: Family Medicine

## 2011-09-03 ENCOUNTER — Encounter: Payer: Self-pay | Admitting: Family Medicine

## 2011-09-03 DIAGNOSIS — R55 Syncope and collapse: Secondary | ICD-10-CM

## 2011-09-03 DIAGNOSIS — R51 Headache: Secondary | ICD-10-CM

## 2011-09-03 DIAGNOSIS — R292 Abnormal reflex: Secondary | ICD-10-CM

## 2011-09-03 LAB — COMPREHENSIVE METABOLIC PANEL
ALT: 21 U/L (ref 0–35)
AST: 26 U/L (ref 0–37)
Alkaline Phosphatase: 77 U/L (ref 39–117)
Sodium: 137 mEq/L (ref 135–145)
Total Bilirubin: 0.5 mg/dL (ref 0.3–1.2)
Total Protein: 7.2 g/dL (ref 6.0–8.3)

## 2011-09-03 LAB — CBC WITH DIFFERENTIAL/PLATELET
Basophils Absolute: 0.1 10*3/uL (ref 0.0–0.1)
Basophils Relative: 1 % (ref 0–1)
Hemoglobin: 14.5 g/dL (ref 12.0–15.0)
MCHC: 33 g/dL (ref 30.0–36.0)
Neutro Abs: 3.6 10*3/uL (ref 1.7–7.7)
Neutrophils Relative %: 52 % (ref 43–77)
RDW: 13.5 % (ref 11.5–15.5)
WBC: 6.9 10*3/uL (ref 4.0–10.5)

## 2011-09-03 LAB — LIPID PANEL
Cholesterol: 294 mg/dL — ABNORMAL HIGH (ref 0–200)
Total CHOL/HDL Ratio: 6.8 Ratio
Triglycerides: 413 mg/dL — ABNORMAL HIGH (ref <150)

## 2011-09-03 LAB — T3 UPTAKE: T3 Uptake: 33.3 % (ref 22.5–37.0)

## 2011-09-03 LAB — FREE THYROXINE INDEX: Free Thyroxine Index: 2.4 (ref 1.0–3.9)

## 2011-09-03 NOTE — Progress Notes (Signed)
  Subjective:    Patient ID: Elizabeth Phelps, female    DOB: [birth year], 56 y.o.   MRN: [id number]  HPI She is here for consultation concerning a 4 month history of constant right parietal headache. Since February she has also noted several episodes of blurred vision followed by syncopal episode none of which have been witnessed. She has had 3 of these in the last 2 weeks. She has no weakness, numbness, tingling, bowel or bladder issues. She has noted difficulty with short-term memory stating she will go somewhere and forget why she is going. No history of injury.   Review of Systems     Objective:   Physical Exam alert and in no distress. EOMI. Fundi benign. Reflexes are 3+ Tympanic membranes and canals are normal. Throat is clear. Tonsils are normal. Neck is supple without adenopathy or thyromegaly. Cardiac exam shows a regular sinus rhythm without murmurs or gallops. Lungs are clear to auscultation.        Assessment & Plan:   1. Headache    2. Hyperreflexia  FREE THYROXINE INDEX, CBC with Differential, RPR, Comprehensive metabolic panel, TSH, T3 uptake, PSA, T4, free, T4, Lipid panel, CT Head W Contrast    1. Headache    2. Hyperreflexia  FREE THYROXINE INDEX, CBC with Differential, RPR, Comprehensive metabolic panel, TSH, T3 uptake, PSA, T4, free, T4, Lipid panel, CT Head W Contrast   the PSA will be canceled of course

## 2011-09-04 LAB — RPR

## 2011-09-09 ENCOUNTER — Ambulatory Visit (INDEPENDENT_AMBULATORY_CARE_PROVIDER_SITE_OTHER): Payer: Self-pay | Admitting: Family Medicine

## 2011-09-09 ENCOUNTER — Encounter: Payer: Self-pay | Admitting: Family Medicine

## 2011-09-09 VITALS — BP 120/70 | HR 85 | Wt 149.0 lb

## 2011-09-09 DIAGNOSIS — G43909 Migraine, unspecified, not intractable, without status migrainosus: Secondary | ICD-10-CM

## 2011-09-09 NOTE — Progress Notes (Signed)
  Subjective:    Patient ID: Elizabeth Phelps, female    DOB: 1956/01/31, 56 y.o.   MRN: [id number]  HPI She is here for recheck. Her CT scan was essentially negative showing no change since her last scan. Further history with her indicates that she does have a previous history of migraine elbow she describes the typical migraine pattern of having any visual or followed by right temporal throbbing headache. She states she's had 3 of these type headaches in the last week. She also continues to complain of a throbbing sensation in the right parietal area. She states that last week symptoms were different.   Review of Systems     Objective:   Physical Exam Alert and in no distress otherwise not examined      Assessment & Plan:   1. Migraine headache    she now describes a migraine headache pattern with 2 different clinical scenarios. I explained this to her. Recommend she take Advil 800 mg 3 times a day. Also gave her a sample of Frova she will call me next week and let me know how she is doing. May need to work on preventative medication

## 2011-09-09 NOTE — Patient Instructions (Signed)
Take 4 Advil 3 times a day for the next one or 2 weeks and then let me know how this works. Try the migraine medicine, Frova and let me know how it works

## 2012-02-18 ENCOUNTER — Telehealth: Payer: Self-pay | Admitting: Family Medicine

## 2012-02-18 MED ORDER — PAROXETINE HCL 20 MG PO TABS: 20.0000 mg | ORAL_TABLET | ORAL | Status: DC

## 2012-02-18 NOTE — Telephone Encounter (Signed)
A prescription was called in 

## 2012-02-18 NOTE — Telephone Encounter (Signed)
Pt called and stated that she has a new job and will not have ins until Oct. 1st. She also states that she will be out of paxil by then. She made an appointment for Oct. 11 for a med check. She will need 2 months worth of meds called into A LOCAL PHARM. So this maybe a NEW PHARM.  Please call into [facility] on [address].

## 2012-03-25 ENCOUNTER — Encounter: Payer: Self-pay | Admitting: Internal Medicine

## 2012-04-03 ENCOUNTER — Ambulatory Visit (INDEPENDENT_AMBULATORY_CARE_PROVIDER_SITE_OTHER): Payer: BC Managed Care – PPO | Admitting: Family Medicine

## 2012-04-03 ENCOUNTER — Encounter: Payer: Self-pay | Admitting: Family Medicine

## 2012-04-03 VITALS — BP 122/70 | HR 59 | Ht 63.5 in | Wt 145.0 lb

## 2012-04-03 DIAGNOSIS — F341 Dysthymic disorder: Secondary | ICD-10-CM

## 2012-04-03 HISTORY — DX: Dysthymic disorder: F34.1

## 2012-04-03 MED ORDER — PAROXETINE HCL 20 MG PO TABS: 20.0000 mg | ORAL_TABLET | ORAL | Status: DC

## 2012-04-03 NOTE — Progress Notes (Signed)
  Subjective:    Patient ID: Elizabeth Phelps, female    DOB: 1955-10-25, 56 y.o.   MRN: [id number]  HPI She is here for a recheck. She continues to do quite nicely on the Paxil. Helps keep her quite stable. She has been under a lot of stress having 2 of her sons, their girlfriends and grandchildren living in a 2 bedroom house with her. Further discussion indicates he did have again plan in terms of selling house and moving on. She is looking forward to this. She has no other concerns or complaints.   Review of Systems     Objective:   Physical Exam Alert and in no distress with appropriate affect and appropriately dressed     Assessment & Plan:   1. Dysthymia  PARoxetine (PAXIL) 20 MG tablet   encouraged her to continue with this game plan and if it falls apart, continue to work on getting out on her own.

## 2012-04-22 ENCOUNTER — Telehealth: Payer: Self-pay | Admitting: Family Medicine

## 2012-04-22 NOTE — Telephone Encounter (Signed)
Pt called and stated that she has a migraine and is requesting that a med be called in for her. She also states that a sample would be ok too. Pt was offered an appointment but it was refused. Pt states due to her history you usually call something in for her. Pt was told that you are not in the office today so it maybe tomorrow before it is addressed. Pt uses [facility] on [address]

## 2012-04-23 MED ORDER — ZOLMITRIPTAN 5 MG PO TABS: 5.0000 mg | ORAL_TABLET | ORAL | Status: DC | PRN

## 2012-04-23 NOTE — Telephone Encounter (Signed)
Zomig given for migraines. She has had difficulty with this in the past and this worked well

## 2012-05-18 ENCOUNTER — Emergency Department (HOSPITAL_COMMUNITY)
Admission: EM | Admit: 2012-05-18 | Discharge: 2012-05-18 | Disposition: A | Discharge status: Home / Self Care | Payer: BC Managed Care – PPO | Attending: Emergency Medicine | Admitting: Emergency Medicine

## 2012-05-18 DIAGNOSIS — G935 Compression of brain: Secondary | ICD-10-CM | POA: Insufficient documentation

## 2012-05-18 DIAGNOSIS — H10029 Other mucopurulent conjunctivitis, unspecified eye: Secondary | ICD-10-CM

## 2012-05-18 DIAGNOSIS — Z860100 Personal history of colon polyps, unspecified: Secondary | ICD-10-CM | POA: Insufficient documentation

## 2012-05-18 DIAGNOSIS — F17200 Nicotine dependence, unspecified, uncomplicated: Secondary | ICD-10-CM | POA: Insufficient documentation

## 2012-05-18 MED ORDER — TETRACAINE HCL 0.5 % OP SOLN: 1.0000 [drp] | Freq: Once | OPHTHALMIC | Status: DC

## 2012-05-18 MED ORDER — ERYTHROMYCIN 5 MG/GM OP OINT: TOPICAL_OINTMENT | OPHTHALMIC | Status: DC

## 2012-05-18 NOTE — ED Provider Notes (Signed)
History     CSN: [id number]  Arrival date & time 05/18/12  1039   First MD Initiated Contact with Patient 05/18/12 1104      Chief Complaint  Patient presents with  . Conjunctivitis    (Consider location/radiation/quality/duration/timing/severity/associated sxs/prior treatment) HPI  Other to the emergency department for itching, drainage and redness to bilateral eyes.  Her son who is also here to be seen also has the same.  She says that her right eye turned red on Friday and that she had a difficult time opening  eye the morning because it was "glued shut". Yesterday she noticed her left eye was also having the same symptoms in both eyes that they were hard to open this morning.  She denies any pain in her eye or difficulty seeing.  She states that it is itchy. nad vss  Past Medical History  Diagnosis Date  . Migraine headache   . Arnold-Chiari malformation, type I   . Smoker   . Personal history of colonic polyps 11/07    Past Surgical History  Procedure Date  . Colonoscopy 2007    Dr. Loreta Ave    No family history on file.  History  Substance Use Topics  . Smoking status: Current Every Day Smoker  . Smokeless tobacco: Never Used  . Alcohol Use: Not on file    OB History    Grav Para Term Preterm Abortions TAB SAB Ect Mult Living                  Review of Systems  Review of Systems  Gen: no weight loss, fevers, chills, night sweats  Eyes: + discharge , no occular pain or visual changes  Neck: no neck pain  Lungs:No wheezing, coughing or hemoptysis CV: no chest pain, palpitations, dependent edema or orthopnea  Abd: no abdominal pain, nausea, vomiting  Skin: no abnormalities Psyche: negative.   Allergies  Codeine  Home Medications   Current Outpatient Rx  Name  Route  Sig  Dispense  Refill  . ADULT MULTIVITAMIN W/MINERALS CH   Oral   Take 1 tablet by mouth daily.         Marland Kitchen PAROXETINE HCL 20 MG PO TABS   Oral   Take 1 tablet (20 mg total)  by mouth every morning.   30 tablet   11   . ZOLMITRIPTAN 5 MG PO TABS   Oral   Take 1 tablet (5 mg total) by mouth as needed for migraine.   10 tablet   5   . ERYTHROMYCIN 5 MG/GM OP OINT      Place a 1/2 inch ribbon of ointment into the lower eyelid q 6 hours for 10 days.   3.5 g   0     BP 128/60  Pulse 93  Temp 98.7 F (37.1 C) (Oral)  SpO2 99%  Physical Exam  Nursing note and vitals reviewed. Constitutional: She appears well-developed and well-nourished. No distress.  HENT:  Head: Normocephalic and atraumatic.  Eyes: EOM are normal. Pupils are equal, round, and reactive to light. No foreign bodies found. Right eye exhibits discharge. No foreign body present in the right eye. Left eye exhibits discharge. No foreign body present in the left eye. Right conjunctiva is injected. Right conjunctiva has no hemorrhage. Left conjunctiva is injected. Left conjunctiva has no hemorrhage.  Neck: Normal range of motion. Neck supple.  Cardiovascular: Normal rate and regular rhythm.   Pulmonary/Chest: Effort normal.  Neurological: She is alert.  Skin: Skin is warm and dry.    ED Course  Procedures (including critical care time)  Labs Reviewed - No data to display No results found.   1. Pink eye       MDM  Rx: Erythromycin ointment.   Work note given and information on how not to spread the disease.  Referral to Dr. Delaney Meigs given if needed  Pt has been advised of the symptoms that warrant their return to the ED. Patient has voiced understanding and has agreed to follow-up with the PCP or specialist.         Dorthula Matas, PA 05/18/12 1203

## 2012-05-18 NOTE — ED Notes (Signed)
Pt began with eye red on Friday became increasingly worse and spread to both eyes. Family members have same symptoms

## 2012-05-18 NOTE — ED Provider Notes (Signed)
Medical screening examination/treatment/procedure(s) were performed by non-physician practitioner and as supervising physician I was immediately available for consultation/collaboration.  Paydon Carll T Khady Vandenberg, MD 05/18/12 1518 

## 2012-12-10 ENCOUNTER — Other Ambulatory Visit: Payer: Self-pay

## 2012-12-10 DIAGNOSIS — Z1231 Encounter for screening mammogram for malignant neoplasm of breast: Secondary | ICD-10-CM

## 2012-12-28 ENCOUNTER — Ambulatory Visit
Admission: RE | Admit: 2012-12-28 | Discharge: 2012-12-28 | Disposition: A | Discharge status: Home / Self Care | Payer: PRIVATE HEALTH INSURANCE | Source: Ambulatory Visit

## 2012-12-28 DIAGNOSIS — Z1231 Encounter for screening mammogram for malignant neoplasm of breast: Secondary | ICD-10-CM

## 2013-01-08 ENCOUNTER — Telehealth: Payer: Self-pay | Admitting: Internal Medicine

## 2013-01-08 ENCOUNTER — Encounter: Payer: Self-pay | Admitting: Family Medicine

## 2013-01-08 ENCOUNTER — Ambulatory Visit (INDEPENDENT_AMBULATORY_CARE_PROVIDER_SITE_OTHER): Payer: PRIVATE HEALTH INSURANCE | Admitting: Family Medicine

## 2013-01-08 VITALS — BP 130/80 | HR 70 | Temp 98.2°F | Wt 144.0 lb

## 2013-01-08 DIAGNOSIS — J45901 Unspecified asthma with (acute) exacerbation: Secondary | ICD-10-CM

## 2013-01-08 DIAGNOSIS — G43909 Migraine, unspecified, not intractable, without status migrainosus: Secondary | ICD-10-CM | POA: Insufficient documentation

## 2013-01-08 DIAGNOSIS — J4521 Mild intermittent asthma with (acute) exacerbation: Secondary | ICD-10-CM

## 2013-01-08 MED ORDER — AMOXICILLIN 875 MG PO TABS: 875.0000 mg | ORAL_TABLET | Freq: Two times a day (BID) | ORAL | Status: DC

## 2013-01-08 MED ORDER — ALBUTEROL SULFATE HFA 108 (90 BASE) MCG/ACT IN AERS: 2.0000 | INHALATION_SPRAY | Freq: Four times a day (QID) | RESPIRATORY_TRACT | Status: DC | PRN

## 2013-01-08 NOTE — Patient Instructions (Signed)
Take all the antibiotic and if not  back to normal when you finish give me a call. Use the inhaler as needed for the coughing and wheezing

## 2013-01-08 NOTE — Telephone Encounter (Signed)
Call in Albuterol inhaler which ever one her insurance will allow her to have

## 2013-01-08 NOTE — Progress Notes (Signed)
  Subjective:    Patient ID: Elizabeth Phelps, female    DOB: [birth year], 57 y.o.   MRN: [id number]  HPI Has a two-week history of a nonproductive cough but no fever, chills, sore throat or earache. She has been exposed to children with rhonchi this and pneumonia. She continues to smoke. She is not having any difficulty with migraines. She continues on Paxil which is helping with her anxiety and mood.  Review of Systems     Objective:   Physical Exam alert and in no distress. Tympanic membranes and canals are normal. Throat is clear. Tonsils are normal. Neck is supple without adenopathy or thyromegaly. Cardiac exam shows a regular sinus rhythm without murmurs or gallops. Lungs show wheezing with expiration        Assessment & Plan:  Asthmatic bronchitis, mild intermittent, with acute exacerbation - Plan: amoxicillin (AMOXIL) 875 MG tablet, albuterol (PROVENTIL HFA;VENTOLIN HFA) 108 (90 BASE) MCG/ACT inhaler  Migraine headache

## 2013-01-08 NOTE — Telephone Encounter (Signed)
Pt states she went to the pharmacy and the albuterol was $70 and she can not afford that. She would like something else sent to [facility]

## 2013-01-08 NOTE — Telephone Encounter (Signed)
CALLED IN [facility] FOR PT 30.00 INSTEAD OF 60

## 2013-04-30 ENCOUNTER — Other Ambulatory Visit: Payer: Self-pay | Admitting: Family Medicine

## 2013-04-30 NOTE — Telephone Encounter (Signed)
Pt needs a refill on paroxentine 20mg , zomig 5mg  to [facility] . Pt is completely out of meds and she just needs a month supply and she will come in and see Dr. Susann Givens next month

## 2013-05-25 ENCOUNTER — Ambulatory Visit (INDEPENDENT_AMBULATORY_CARE_PROVIDER_SITE_OTHER): Payer: PRIVATE HEALTH INSURANCE | Admitting: Family Medicine

## 2013-05-25 ENCOUNTER — Encounter: Payer: Self-pay | Admitting: Family Medicine

## 2013-05-25 VITALS — BP 130/80 | HR 82 | Wt 147.0 lb

## 2013-05-25 DIAGNOSIS — F17200 Nicotine dependence, unspecified, uncomplicated: Secondary | ICD-10-CM

## 2013-05-25 DIAGNOSIS — F341 Dysthymic disorder: Secondary | ICD-10-CM

## 2013-05-25 DIAGNOSIS — G43909 Migraine, unspecified, not intractable, without status migrainosus: Secondary | ICD-10-CM

## 2013-05-25 DIAGNOSIS — G4700 Insomnia, unspecified: Secondary | ICD-10-CM

## 2013-05-25 DIAGNOSIS — Z79899 Other long term (current) drug therapy: Secondary | ICD-10-CM

## 2013-05-25 MED ORDER — ZOLMITRIPTAN 5 MG PO TABS: 5.0000 mg | ORAL_TABLET | ORAL | Status: DC | PRN

## 2013-05-25 MED ORDER — PAROXETINE HCL 20 MG PO TABS: ORAL_TABLET | ORAL | Status: DC

## 2013-05-25 NOTE — Progress Notes (Signed)
   Subjective:    Patient ID: Elizabeth Phelps, female    DOB: Jun 13, 1956, 57 y.o.   MRN: [id number]  HPI She is here for medication check. She continues on Paxil and Zomig. She is getting made to move in with her son and daughter-in-law. Apparently the daughter-in-law is about to give birth and has stage IV cervical cancer. She continues to do well on her present medication regimen but is having some difficulty with sleep and did ask for Xanax. Her headaches have not been giving her too much trouble.   Review of Systems     Objective:   Physical Exam Alert and in no distress otherwise not examined       Assessment & Plan:  Migraine headache - Plan: zolmitriptan (ZOMIG) 5 MG tablet  Dysthymia - Plan: PARoxetine (PAXIL) 20 MG tablet  Encounter for long-term (current) use of other medications  Insomnia  Current smoker  I will renew her Zomig. She is also to continue on Paxil. Discussed sleep and sleep hygiene with her. Gave her instructions concerning this. I will potentially give her sleep meds but not at the present time. Discussed smoking cessation and at this point she is not interested nor will I push this issue.

## 2013-05-25 NOTE — Patient Instructions (Signed)
Insomnia Insomnia is frequent trouble falling and/or staying asleep. Insomnia can be a long term problem or a short term problem. Both are common. Insomnia can be a short term problem when the wakefulness is related to a certain stress or worry. Long term insomnia is often related to ongoing stress during waking hours and/or poor sleeping habits. Overtime, sleep deprivation itself can make the problem worse. Every little thing feels more severe because you are overtired and your ability to cope is decreased. CAUSES   Stress, anxiety, and depression.  Poor sleeping habits.  Distractions such as TV in the bedroom.  Naps close to bedtime.  Engaging in emotionally charged conversations before bed.  Technical reading before sleep.  Alcohol and other sedatives. They may make the problem worse. They can hurt normal sleep patterns and normal dream activity.  Stimulants such as caffeine for several hours prior to bedtime.  Pain syndromes and shortness of breath can cause insomnia.  Exercise late at night.  Changing time zones may cause sleeping problems (jet lag). It is sometimes helpful to have someone observe your sleeping patterns. They should look for periods of not breathing during the night (sleep apnea). They should also look to see how long those periods last. If you live alone or observers are uncertain, you can also be observed at a sleep clinic where your sleep patterns will be professionally monitored. Sleep apnea requires a checkup and treatment. Give your caregivers your medical history. Give your caregivers observations your family has made about your sleep.  SYMPTOMS   Not feeling rested in the morning.  Anxiety and restlessness at bedtime.  Difficulty falling and staying asleep. TREATMENT   Your caregiver may prescribe treatment for an underlying medical disorders. Your caregiver can give advice or help if you are using alcohol or other drugs for self-medication. Treatment  of underlying problems will usually eliminate insomnia problems.  Medications can be prescribed for short time use. They are generally not recommended for lengthy use.  Over-the-counter sleep medicines are not recommended for lengthy use. They can be habit forming.  You can promote easier sleeping by making lifestyle changes such as:  Using relaxation techniques that help with breathing and reduce muscle tension.  Exercising earlier in the day.  Changing your diet and the time of your last meal. No night time snacks.  Establish a regular time to go to bed.  Counseling can help with stressful problems and worry.  Soothing music and white noise may be helpful if there are background noises you cannot remove.  Stop tedious detailed work at least one hour before bedtime. HOME CARE INSTRUCTIONS   Keep a diary. Inform your caregiver about your progress. This includes any medication side effects. See your caregiver regularly. Take note of:  Times when you are asleep.  Times when you are awake during the night.  The quality of your sleep.  How you feel the next day. This information will help your caregiver care for you.  Get out of bed if you are still awake after 15 minutes. Read or do some quiet activity. Keep the lights down. Wait until you feel sleepy and go back to bed.  Keep regular sleeping and waking hours. Avoid naps.  Exercise regularly.  Avoid distractions at bedtime. Distractions include watching television or engaging in any intense or detailed activity like attempting to balance the household checkbook.  Develop a bedtime ritual. Keep a familiar routine of bathing, brushing your teeth, climbing into bed at the same   time each night, listening to soothing music. Routines increase the success of falling to sleep faster.  Use relaxation techniques. This can be using breathing and muscle tension release routines. It can also include visualizing peaceful scenes. You can  also help control troubling or intruding thoughts by keeping your mind occupied with boring or repetitive thoughts like the old concept of counting sheep. You can make it more creative like imagining planting one beautiful flower after another in your backyard garden.  During your day, work to eliminate stress. When this is not possible use some of the previous suggestions to help reduce the anxiety that accompanies stressful situations. MAKE SURE YOU:   Understand these instructions.  Will watch your condition.  Will get help right away if you are not doing well or get worse. Document Released: 06/07/2000 Document Revised: 09/02/2011 Document Reviewed: 07/08/2007 [facility] Patient Information 2014 [place], [place]. Use the serenity prayer. Make a list of things you need to do the next day before you go to bed

## 2013-10-06 ENCOUNTER — Telehealth: Payer: Self-pay | Admitting: Family Medicine

## 2013-10-06 DIAGNOSIS — G43909 Migraine, unspecified, not intractable, without status migrainosus: Secondary | ICD-10-CM

## 2013-10-06 MED ORDER — ZOLMITRIPTAN 5 MG PO TABS: 5.0000 mg | ORAL_TABLET | ORAL | Status: DC | PRN

## 2013-10-06 NOTE — Telephone Encounter (Signed)
Pt needs refill on Zomig sent to [facility] in [place]. Pt has a new phone number [phone]

## 2013-10-28 ENCOUNTER — Encounter: Payer: Self-pay | Admitting: Family Medicine

## 2013-10-28 ENCOUNTER — Ambulatory Visit (INDEPENDENT_AMBULATORY_CARE_PROVIDER_SITE_OTHER): Payer: BC Managed Care – PPO | Admitting: Family Medicine

## 2013-10-28 VITALS — BP 100/72

## 2013-10-28 DIAGNOSIS — M533 Sacrococcygeal disorders, not elsewhere classified: Secondary | ICD-10-CM

## 2013-10-28 NOTE — Patient Instructions (Signed)
Patient should take 4 advil three times a day or 2 aleve twice a day. Use heat for 20 mins three times a day and do gentle stretching after application of the heat.

## 2013-10-28 NOTE — Progress Notes (Signed)
   Subjective:    Patient ID: Elizabeth Phelps, female    DOB: 1955-10-22, 58 y.o.   MRN: [id number]  Elizabeth Phelps is a very pleasant 58 y.o. yo female who  has a past medical history of Migraine headache; Arnold-Chiari malformation, type I; Smoker; and Personal history of colonic polyps (11/07). She presents today for back pain.  HPI  Woke up this morning in her normal state of health. The patient bent down to get dressed and as she was strightening up, she felt her lower back "pop." She states that she almoost lost her breath due to the sharp stabbing pain that radiated stright down into her leg and ankle at this time. The patient was able to take tylenol and continue to function but began to experience leg numbness 4 hours later. The numbness is not painful, the leg feels like its asleep and is associated with a prickling sensation.   The patient denies any problems with balance or motion of the leg, however she continues to have pain with movement of her back, especially when laying flat or moving from standing to sitting positions. The patient is unable to fully extend her back without significant pain. The patient has tried tylenol today which did not help her pain.  Of note, the patient states that this has happened several times before but to a much lesser degree. The paitent decribes numerous instances of catching in her back that felt similar to her initial "popping" sensation, however the pain from this instance is much more severe and is associated with the leg symptoms described above, which has never happened before.   Review of Systems is negative except per HPI.    Objective:   Physical Exam  Back: tender to palpation over left SI joint. Intact sensation bilaterally down to the ankles. Intact and symmetric patellar and achilles reflexes. Negative straight leg raise. Normal range of motion at the hip. He stork test was very positive. Difficult to do [facility] testing due to  pain.    Assessment & Plan:   Sacroiliac dysfunction  Will manage with conservative therapy for right now. Patient should use OTC pain relievers, heat for 20 mins three times a day with gentle stretching after. If no improvement, she will return here for further evaluation.

## 2013-11-19 ENCOUNTER — Encounter: Payer: BC Managed Care – PPO | Admitting: Family Medicine

## 2013-12-17 ENCOUNTER — Ambulatory Visit (INDEPENDENT_AMBULATORY_CARE_PROVIDER_SITE_OTHER): Payer: BC Managed Care – PPO | Admitting: Family Medicine

## 2013-12-17 ENCOUNTER — Encounter: Payer: BC Managed Care – PPO | Admitting: Family Medicine

## 2013-12-17 ENCOUNTER — Encounter: Payer: Self-pay | Admitting: Family Medicine

## 2013-12-17 VITALS — BP 108/74 | Wt 149.0 lb

## 2013-12-17 DIAGNOSIS — F341 Dysthymic disorder: Secondary | ICD-10-CM

## 2013-12-17 MED ORDER — PAROXETINE HCL 10 MG PO TABS: 10.0000 mg | ORAL_TABLET | Freq: Every day | ORAL | Status: DC

## 2013-12-17 NOTE — Progress Notes (Signed)
   Subjective:    Patient ID: Elizabeth Phelps, female    DOB: 1955/12/12, 58 y.o.   MRN: [id number]  HPI He is here for consult concerning continuing her Paxil. She has done quite nicely on this for the last several years. She was placed on it several years ago when she had CNS surgery. She is under a lot of anxiety at that time. Since then she has done quite well clinically she is in a good space at the present time.   Review of Systems     Objective:   Physical Exam Alert and in no distress with appropriate affect and appropriately dressed.      Assessment & Plan:  Dysthymia - Plan: PARoxetine (PAXIL) 10 MG tablet  I discussed options with her concerning this. She would like to see if she can do without the medicine. I will have her decrease to 10 mg and then stop after one month. Discussed the fact that she might note more mood swings but as long as she doesn't go downhill, we should be okay. She will keep me informed.

## 2014-01-14 ENCOUNTER — Other Ambulatory Visit: Payer: Self-pay | Admitting: Family Medicine

## 2014-02-22 ENCOUNTER — Telehealth: Payer: Self-pay | Admitting: Family Medicine

## 2014-02-22 DIAGNOSIS — F341 Dysthymic disorder: Secondary | ICD-10-CM

## 2014-02-22 NOTE — Telephone Encounter (Signed)
Pt states that it was sent in for paxil 20mg  and never got the 10mg  sent to pharmacy so instead of just stopping at the 20mg , she wants 10mg  sent in for 30 days then she will stop that med after that. Is it okay to send in paxil 10mg ?

## 2014-02-22 NOTE — Telephone Encounter (Signed)
My last note said that we are going to stop this medication. Get more information from her and I will probably refill it.

## 2014-02-23 MED ORDER — PAROXETINE HCL 10 MG PO TABS: 10.0000 mg | ORAL_TABLET | Freq: Every day | ORAL | Status: DC

## 2014-02-23 NOTE — Telephone Encounter (Signed)
Tell her that I called this in

## 2014-02-23 NOTE — Telephone Encounter (Signed)
PT WAS ALREADY INFORMED

## 2014-03-28 ENCOUNTER — Encounter: Payer: Self-pay | Admitting: Family Medicine

## 2014-03-28 ENCOUNTER — Ambulatory Visit (INDEPENDENT_AMBULATORY_CARE_PROVIDER_SITE_OTHER): Payer: Self-pay | Admitting: Family Medicine

## 2014-03-28 VITALS — BP 120/76 | HR 60 | Wt 147.0 lb

## 2014-03-28 DIAGNOSIS — L309 Dermatitis, unspecified: Secondary | ICD-10-CM

## 2014-03-28 DIAGNOSIS — F341 Dysthymic disorder: Secondary | ICD-10-CM

## 2014-03-28 MED ORDER — PAROXETINE HCL 10 MG PO TABS: 10.0000 mg | ORAL_TABLET | Freq: Every day | ORAL | Status: DC

## 2014-03-28 NOTE — Patient Instructions (Signed)
Use cortisone cream sparingly twice per day as needed next 10 days

## 2014-03-28 NOTE — Progress Notes (Signed)
   Subjective:    Patient ID: Elizabeth Phelps, female    DOB: June 02, 1956, 58 y.o.   MRN: [id number]  HPI She has a several week history of rash present on the left side of her neck is pruritic in nature. She has also noted several other erythematous pruritic lesions recently on her back. These have been noted in the last several days. She's had no new soaps, detergents,: Or exposure to other chemicals. She also has been stressed recently due to loss of a job. She did taper off of her Paxil to 10 mg. Around the same time as when she lost her job and she says that she is easily distracted at this time and cannot focus.   Review of Systems     Objective:   Physical Exam Alert and in no distress. Four erythematous lesions are noted on the left side of the neck with 3 on the low back. Exam of her abdomen and arms was negative.       Assessment & Plan:  Dysthymia - Plan: PARoxetine (PAXIL) 10 MG tablet  Dermatitis  explained that this is probably nonspecific and recommend treating with cortisone. She is to pay attention if any does make this worse. We'll also maintain her Paxil at the present dosing. I explained that her stress is probably causing more of an issue than the Paxil but we'll cover her with the present dosing for the present time. Specifically to help get through starting a new job.

## 2014-06-08 ENCOUNTER — Telehealth: Payer: Self-pay | Admitting: Family Medicine

## 2014-06-08 NOTE — Telephone Encounter (Signed)
Pt says Paxil 10MG  is not working for her. She can not focus, breaking out in hives since being on 10mg . For past 1 1/2 months, she has been taking 2 10mg 's and she is focusing better and performing better at work, hives are gone. Can she get 20mg  Paxil again?

## 2014-06-09 MED ORDER — PAROXETINE HCL 20 MG PO TABS: 20.0000 mg | ORAL_TABLET | Freq: Every day | ORAL | Status: DC

## 2014-09-09 ENCOUNTER — Telehealth: Payer: Self-pay | Admitting: Internal Medicine

## 2014-09-09 NOTE — Telephone Encounter (Signed)
Pt states she needs a refill on her paxil 20mg  #90 to [facility].

## 2014-09-10 MED ORDER — PAROXETINE HCL 20 MG PO TABS: 20.0000 mg | ORAL_TABLET | Freq: Every day | ORAL | Status: DC

## 2015-04-06 ENCOUNTER — Telehealth: Payer: Self-pay | Admitting: Family Medicine

## 2015-04-06 ENCOUNTER — Other Ambulatory Visit: Payer: Self-pay | Admitting: *Deleted

## 2015-04-06 MED ORDER — PAROXETINE HCL 20 MG PO TABS: 20.0000 mg | ORAL_TABLET | Freq: Every day | ORAL | Status: DC

## 2015-04-06 NOTE — Telephone Encounter (Signed)
Pt requesting refill on Paroxetine 20mg . She is completely out of med and need it for today.

## 2015-04-06 NOTE — Telephone Encounter (Signed)
Pt made appt and med sent in to pharmacy

## 2015-04-06 NOTE — Telephone Encounter (Signed)
Last visit over a year ago.   Needs appt.     Call out #[phone] to get her by

## 2015-04-13 ENCOUNTER — Encounter: Payer: Self-pay | Admitting: Family Medicine

## 2015-04-18 ENCOUNTER — Encounter: Payer: Self-pay | Admitting: Family Medicine

## 2015-06-25 ENCOUNTER — Emergency Department (HOSPITAL_COMMUNITY): Payer: PRIVATE HEALTH INSURANCE

## 2015-06-25 ENCOUNTER — Emergency Department (HOSPITAL_COMMUNITY)
Admission: EM | Admit: 2015-06-25 | Discharge: 2015-06-25 | Disposition: A | Discharge status: Home / Self Care | Payer: Self-pay | Attending: Emergency Medicine | Admitting: Emergency Medicine

## 2015-06-25 ENCOUNTER — Encounter (HOSPITAL_COMMUNITY): Payer: Self-pay | Admitting: *Deleted

## 2015-06-25 ENCOUNTER — Emergency Department (HOSPITAL_COMMUNITY): Payer: Self-pay

## 2015-06-25 DIAGNOSIS — H53462 Homonymous bilateral field defects, left side: Secondary | ICD-10-CM | POA: Insufficient documentation

## 2015-06-25 DIAGNOSIS — Z79899 Other long term (current) drug therapy: Secondary | ICD-10-CM | POA: Insufficient documentation

## 2015-06-25 DIAGNOSIS — Z86010 Personal history of colonic polyps: Secondary | ICD-10-CM | POA: Insufficient documentation

## 2015-06-25 DIAGNOSIS — G43909 Migraine, unspecified, not intractable, without status migrainosus: Secondary | ICD-10-CM | POA: Insufficient documentation

## 2015-06-25 DIAGNOSIS — G43009 Migraine without aura, not intractable, without status migrainosus: Secondary | ICD-10-CM

## 2015-06-25 DIAGNOSIS — F17200 Nicotine dependence, unspecified, uncomplicated: Secondary | ICD-10-CM | POA: Insufficient documentation

## 2015-06-25 LAB — COMPREHENSIVE METABOLIC PANEL
ALT: 45 U/L (ref 14–54)
AST: 44 U/L — AB (ref 15–41)
Albumin: 3.7 g/dL (ref 3.5–5.0)
Alkaline Phosphatase: 70 U/L (ref 38–126)
Anion gap: 11 (ref 5–15)
BUN: 12 mg/dL (ref 6–20)
CHLORIDE: 102 mmol/L (ref 101–111)
CO2: 26 mmol/L (ref 22–32)
CREATININE: 0.98 mg/dL (ref 0.44–1.00)
Calcium: 9.3 mg/dL (ref 8.9–10.3)
GFR calc Af Amer: >60 mL/min (ref >60)
GFR calc non Af Amer: >60 mL/min (ref >60)
Glucose, Bld: 97 mg/dL (ref 65–99)
POTASSIUM: 4.2 mmol/L (ref 3.5–5.1)
SODIUM: 139 mmol/L (ref 135–145)
Total Bilirubin: 0.8 mg/dL (ref 0.3–1.2)
Total Protein: 7.2 g/dL (ref 6.5–8.1)

## 2015-06-25 LAB — DIFFERENTIAL
BASOS ABS: 0.1 10*3/uL (ref 0.0–0.1)
BASOS PCT: 1 %
Eosinophils Absolute: 0.3 10*3/uL (ref 0.0–0.7)
Eosinophils Relative: 5 %
Lymphocytes Relative: 40 %
Lymphs Abs: 2.6 10*3/uL (ref 0.7–4.0)
Monocytes Absolute: 0.6 10*3/uL (ref 0.1–1.0)
Monocytes Relative: 10 %
NEUTROS ABS: 2.8 10*3/uL (ref 1.7–7.7)
NEUTROS PCT: 44 %

## 2015-06-25 LAB — CBC
HEMATOCRIT: 43.4 % (ref 36.0–46.0)
Hemoglobin: 14.3 g/dL (ref 12.0–15.0)
MCH: 28.3 pg (ref 26.0–34.0)
MCHC: 32.9 g/dL (ref 30.0–36.0)
MCV: 85.8 fL (ref 78.0–100.0)
PLATELETS: 245 10*3/uL (ref 150–400)
RBC: 5.06 MIL/uL (ref 3.87–5.11)
RDW: 13.5 % (ref 11.5–15.5)
WBC: 6.4 10*3/uL (ref 4.0–10.5)

## 2015-06-25 LAB — I-STAT TROPONIN, ED: Troponin i, poc: 0 ng/mL (ref 0.00–0.08)

## 2015-06-25 MED ORDER — FENTANYL CITRATE (PF) 100 MCG/2ML IJ SOLN
50.0000 ug | Freq: Once | INTRAMUSCULAR | Status: AC
Start: 2015-06-25 — End: 2015-06-25
  Administered 2015-06-25: 50 ug via INTRAVENOUS
  Filled 2015-06-25: qty 2

## 2015-06-25 MED ORDER — DIPHENHYDRAMINE HCL 50 MG/ML IJ SOLN
25.0000 mg | Freq: Once | INTRAMUSCULAR | Status: AC
  Administered 2015-06-25: 25 mg via INTRAVENOUS
  Filled 2015-06-25: qty 1

## 2015-06-25 MED ORDER — IBUPROFEN 600 MG PO TABS: 600.0000 mg | ORAL_TABLET | Freq: Four times a day (QID) | ORAL | Status: DC | PRN

## 2015-06-25 MED ORDER — METOCLOPRAMIDE HCL 5 MG/ML IJ SOLN
10.0000 mg | Freq: Once | INTRAMUSCULAR | Status: AC
  Administered 2015-06-25: 10 mg via INTRAVENOUS
  Filled 2015-06-25: qty 2

## 2015-06-25 NOTE — ED Notes (Addendum)
Pt reports hx of brain surgery 13 years ago, hx of occ migraines since. Onset of migraine on Friday but now having vision loss to right eye since she woke up this am. She reports that vision loss is affecting her balance and pt feels unsteady.

## 2015-06-25 NOTE — ED Provider Notes (Addendum)
CSN: [id number]     Arrival date & time 06/25/15  1345 History   First MD Initiated Contact with Patient 06/25/15 1601     Chief Complaint  Patient presents with  . Migraine  . Loss of Vision     (Consider location/radiation/quality/duration/timing/severity/associated sxs/prior Treatment) HPI Comments: Pt comes in with cc of headache and vision loss. She has hx of migraine, arnold-chiari malformation. Reports that she started having headaches on Friday, and her migraines have persisted. She has a severe headache. Headache is typical of her migraine, just more intense and lasting longer than usual. The headache is behind the L eye. Pt noted this AM that her right eye wasn't feeling well. When she covered her right eye, it seems like the inside 3rd of the eye she cant see clearly. Pt also reports that everytime she closes her eyes, she has dizziness - described as spinning of her head. No visual complains or dizziness before. No neck pain.   ROS 10 Systems reviewed and are negative for acute change except as noted in the HPI.      Patient is a 60 y.o. female presenting with migraines. The history is provided by the patient.  Migraine    Past Medical History  Diagnosis Date  . Migraine headache   . Arnold-Chiari malformation, type I ([place])   . Smoker   . Personal history of colonic polyps 11/07   Past Surgical History  Procedure Laterality Date  . Colonoscopy  2007    Dr. Collene Mares   Family History  Problem Relation Age of Onset  . Early death Sister    Social History  Substance Use Topics  . Smoking status: Current Every Day Smoker  . Smokeless tobacco: Never Used  . Alcohol Use: No   OB History    No data available     Review of Systems    Allergies  Codeine  Home Medications   Prior to Admission medications   Medication Sig Start Date End Date Taking? Authorizing Provider  ibuprofen (ADVIL,MOTRIN) 200 MG tablet Take 200 mg by mouth every 6 (six) hours as  needed for headache.   Yes Historical Provider, MD  Multiple Vitamin (MULTIVITAMIN WITH MINERALS) TABS Take 1 tablet by mouth daily.   Yes Historical Provider, MD  PARoxetine (PAXIL) 20 MG tablet Take 1 tablet (20 mg total) by mouth daily. 04/06/15  Yes Denita Lung, MD  zolmitriptan (ZOMIG) 5 MG tablet Take 1 tablet (5 mg total) by mouth as needed for migraine. Patient not taking: Reported on 06/25/2015 10/06/13   Denita Lung, MD   BP 92/60 mmHg  Pulse 78  Temp(Src) 98.2 F (36.8 C) (Oral)  Resp 18  SpO2 94% Physical Exam  ED Course  Procedures (including critical care time) Labs Review Labs Reviewed  COMPREHENSIVE METABOLIC PANEL - Abnormal; Notable for the following:    AST 44 (*)    All other components within normal limits  CBC  DIFFERENTIAL  I-STAT TROPOININ, ED    Imaging Review Ct Head Wo Contrast  06/25/2015  CLINICAL DATA:  Patient with headache.  History of known aneurysm. EXAM: CT HEAD WITHOUT CONTRAST TECHNIQUE: Contiguous axial images were obtained from the base of the skull through the vertex without intravenous contrast. COMPARISON:  Brain CT 05/30/2006 FINDINGS: Ventricles and sulci are appropriate for patient's age. No evidence for acute cortically based infarct, intracranial hemorrhage, mass lesion or mass effect. Postsurgical changes compatible with prior right frontotemporal craniotomy. Aneurysmal enlargement of the right  middle cerebral artery, re- demonstrated. Old lacunar infarct within the right external capsule and right anterior temporal lobe. The orbits are unremarkable. Paranasal sinuses are well aerated. Mastoid air cells are unremarkable. IMPRESSION: No acute intracranial process. 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 Right MCA artery aneurysm. This is poorly evaluated without intravenous contrast material. Electronically Signed   By: Lovey Newcomer M.D.   On: 06/25/2015 14:42   Mr Angiogram Head Wo Contrast  06/25/2015  CLINICAL DATA:   Brain surgery 13 years ago, occasional migraines since that time. Now with continued headaches and RIGHT eye visual loss. EXAM: MRI HEAD WITHOUT CONTRAST MRA HEAD WITHOUT CONTRAST TECHNIQUE: Multiplanar, multiecho pulse sequences of the brain and surrounding structures were obtained without intravenous contrast. Angiographic images of the head were obtained using MRA technique without contrast. COMPARISON:  CT head earlier today. Multiple prior CT head scans. Catheter angiogram 04/15/2001 and 03/28/2001. FINDINGS: MRI HEAD FINDINGS No restricted diffusion to suggest acute stroke. Encephalomalacia of the RIGHT temporal lobe probably represents a combination of ischemic and postsurgical encephalomalacia. Associated RIGHT lateral lenticulostriate territory chronic infarct affects the external capsule, lateral contain min, and periventricular white matter. Patulous RIGHT temporal horn. Pterional craniotomy uncomplicated. No acute hemorrhage, but gradient sequence shows susceptibility around the RIGHT MCA aneurysm, described below. Study not performed as an orbital dedicated exam, but globes and optic nerves appear symmetric. No orbital masses are apparent. There is no proptosis or signs of papilledema. Flow voids are maintained in the BILATERAL ophthalmic arteries and superior ophthalmic veins. Cerebellar tonsils are abnormally low, up to 13 mm below the foramen magnum. No hydromyelia or hydrocephalus. There is also partial empty sella. Possible LEFT cataract extraction. No paranasal sinus disease of significance. Trace RIGHT mastoid effusion. Scalp soft tissues unremarkable. MRA HEAD FINDINGS Motion degraded exam. There is a combination fusiform and saccular aneurysm affecting the RIGHT middle cerebral artery throughout its M1 segment. The fusiform component has a maximum transverse diameter approximately 6 mm and smoothly tapers as the vessel approaches the trifurcation. No involvement of the M2 branches. The saccular  component has a wide neck, projects laterally from the M1 segment proximally, and measures approximately 9 x 9 x 7 mm (R-L x A-P x C-C). There is associated peripheral calcification. The aneurysm appears similar in size and shape when compared to the prior catheter angiogram of 03/28/2001. Both internal carotid arteries are widely patent. No ACA or LEFT MCA disease of significance. Normal basilar artery with codominant vertebral arteries. No posterior circulation or cerebellar branch abnormalities. IMPRESSION: No evidence for acute infarction or acute intracranial hemorrhage. Chronic changes of encephalomalacia and ischemia in the RIGHT temporal lobe and RIGHT lenticulostriate territory. Cerebellar tonsillar ectopia and partial empty sella. Correlate clinically for idiopathic intracranial hypertension. Status post craniotomy and endovascular attempts at treatment of a combination fusiform/saccular RIGHT MCA aneurysm originating from the RIGHT M1 segment. Fusiform segment measures up to 6 mm transverse diameter. The aneurysm measures 9 x 9 x 7 mm and appears similar to catheter angiogram of 2002. See discussion above. No features to suggest acute aneurysmal enlargement or vascular occlusion on today's study, but formal catheter angiogram is recommended for further evaluation. Electronically Signed   By: Staci Righter M.D.   On: 06/25/2015 19:23   Mr Brain Wo Contrast  06/25/2015  CLINICAL DATA:  Brain surgery 13 years ago, occasional migraines since that time. Now with continued headaches and RIGHT eye visual loss. EXAM: MRI HEAD WITHOUT CONTRAST MRA HEAD WITHOUT CONTRAST TECHNIQUE:  Multiplanar, multiecho pulse sequences of the brain and surrounding structures were obtained without intravenous contrast. Angiographic images of the head were obtained using MRA technique without contrast. COMPARISON:  CT head earlier today. Multiple prior CT head scans. Catheter angiogram 04/15/2001 and 03/28/2001. FINDINGS: MRI HEAD  FINDINGS No restricted diffusion to suggest acute stroke. Encephalomalacia of the RIGHT temporal lobe probably represents a combination of ischemic and postsurgical encephalomalacia. Associated RIGHT lateral lenticulostriate territory chronic infarct affects the external capsule, lateral contain min, and periventricular white matter. Patulous RIGHT temporal horn. Pterional craniotomy uncomplicated. No acute hemorrhage, but gradient sequence shows susceptibility around the RIGHT MCA aneurysm, described below. Study not performed as an orbital dedicated exam, but globes and optic nerves appear symmetric. No orbital masses are apparent. There is no proptosis or signs of papilledema. Flow voids are maintained in the BILATERAL ophthalmic arteries and superior ophthalmic veins. Cerebellar tonsils are abnormally low, up to 13 mm below the foramen magnum. No hydromyelia or hydrocephalus. There is also partial empty sella. Possible LEFT cataract extraction. No paranasal sinus disease of significance. Trace RIGHT mastoid effusion. Scalp soft tissues unremarkable. MRA HEAD FINDINGS Motion degraded exam. There is a combination fusiform and saccular aneurysm affecting the RIGHT middle cerebral artery throughout its M1 segment. The fusiform component has a maximum transverse diameter approximately 6 mm and smoothly tapers as the vessel approaches the trifurcation. No involvement of the M2 branches. The saccular component has a wide neck, projects laterally from the M1 segment proximally, and measures approximately 9 x 9 x 7 mm (R-L x A-P x C-C). There is associated peripheral calcification. The aneurysm appears similar in size and shape when compared to the prior catheter angiogram of 03/28/2001. Both internal carotid arteries are widely patent. No ACA or LEFT MCA disease of significance. Normal basilar artery with codominant vertebral arteries. No posterior circulation or cerebellar branch abnormalities. IMPRESSION: No evidence  for acute infarction or acute intracranial hemorrhage. Chronic changes of encephalomalacia and ischemia in the RIGHT temporal lobe and RIGHT lenticulostriate territory. Cerebellar tonsillar ectopia and partial empty sella. Correlate clinically for idiopathic intracranial hypertension. Status post craniotomy and endovascular attempts at treatment of a combination fusiform/saccular RIGHT MCA aneurysm originating from the RIGHT M1 segment. Fusiform segment measures up to 6 mm transverse diameter. The aneurysm measures 9 x 9 x 7 mm and appears similar to catheter angiogram of 2002. See discussion above. No features to suggest acute aneurysmal enlargement or vascular occlusion on today's study, but formal catheter angiogram is recommended for further evaluation. Electronically Signed   By: Staci Righter M.D.   On: 06/25/2015 19:23   I have personally reviewed and evaluated these images and lab results as part of my medical decision-making.   EKG Interpretation   Date/Time:  Sunday June 25 2015 15:12:58 EST Ventricular Rate:  65 PR Interval:  164 QRS Duration: 121 QT Interval:  438 QTC Calculation: 455 R Axis:   47 Text Interpretation:  Sinus rhythm IVCD, consider atypical RBBB No  significant change since last tracing normal axis   Confirmed by ,  MD,  ([id number]) on 06/25/2015 5:23:30 PM      20 02 IR Angio results IMPRESSION LARGE COMPLEX RIGHT MIDDLE CEREBRAL ARTERY ANEURYSM WITH A SACCULAR COMPONENT MEASURING 14 MM X 11 MM X 8 MM CONTAINED TO A FUSIFORM ANEURYSM EXTENDING JUST PROXIMAL TO THE RIGHT MIDDLE CEREBRAL ARTERY TRIFURCATION MDM   Final diagnoses:  Nonintractable migraine, unspecified migraine type  Hemianopia of left eye    Pt comes in  with cc of headaches, vision complains, loss of vision (L medial hemianopsia). Has hx of brain AN, and one area  That was not coiled on the R side. Otherwise the headaches are consistent with migraines - just more intense than usual.  Neuro exam - besides the eye findings, is non focal.  MRI, MRA ordered. Will reassess. Visual acuity ordered.     Varney Biles, MD 06/25/15 1725  @900  Pt's headache has resolved. Dizziness has resolved too. The vision complains persist.  Unlikely to be temporal arteritis. She is young, headaches is migraine type headache and is an acute headache.  Retinal detachment unlikely - there is just 1/3 nasal hemianopia. Eye pressures not checked - as there is no red eye or eye pain.  Will touch base with Optho as well. MRI findings are reassuring and discussed with the patient.   Varney Biles, MD 06/25/15 2121

## 2015-06-25 NOTE — Discharge Instructions (Signed)
Please take ibuprofen for the headache. We would recommend that you see the Dr. Alanda Slim, eye specialist in 2 days. Return to the ER if the vision is worsening or there is severe eye pain/headaches.  See the Neurologist for optimal care of the migraines.   Migraine Headache A migraine headache is an intense, throbbing pain on one or both sides of your head. A migraine can last for 30 minutes to several hours. CAUSES  The exact cause of a migraine headache is not always known. However, a migraine may be caused when nerves in the brain become irritated and release chemicals that cause inflammation. This causes pain. Certain things may also trigger migraines, such as:  Alcohol.  Smoking.  Stress.  Menstruation.  Aged cheeses.  Foods or drinks that contain nitrates, glutamate, aspartame, or tyramine.  Lack of sleep.  Chocolate.  Caffeine.  Hunger.  Physical exertion.  Fatigue.  Medicines used to treat chest pain (nitroglycerine), birth control pills, estrogen, and some blood pressure medicines. SIGNS AND SYMPTOMS  Pain on one or both sides of your head.  Pulsating or throbbing pain.  Severe pain that prevents daily activities.  Pain that is aggravated by any physical activity.  Nausea, vomiting, or both.  Dizziness.  Pain with exposure to bright lights, loud noises, or activity.  General sensitivity to bright lights, loud noises, or smells. Before you get a migraine, you may get warning signs that a migraine is coming (aura). An aura may include:  Seeing flashing lights.  Seeing bright spots, halos, or zigzag lines.  Having tunnel vision or blurred vision.  Having feelings of numbness or tingling.  Having trouble talking.  Having muscle weakness. DIAGNOSIS  A migraine headache is often diagnosed based on:  Symptoms.  Physical exam.  A CT scan or MRI of your head. These imaging tests cannot diagnose migraines, but they can help rule out other causes  of headaches. TREATMENT Medicines may be given for pain and nausea. Medicines can also be given to help prevent recurrent migraines.  HOME CARE INSTRUCTIONS  Only take over-the-counter or prescription medicines for pain or discomfort as directed by your health care provider. The use of long-term narcotics is not recommended.  Lie down in a dark, quiet room when you have a migraine.  Keep a journal to find out what may trigger your migraine headaches. For example, write down:  What you eat and drink.  How much sleep you get.  Any change to your diet or medicines.  Limit alcohol consumption.  Quit smoking if you smoke.  Get 7-9 hours of sleep, or as recommended by your health care provider.  Limit stress.  Keep lights dim if bright lights bother you and make your migraines worse. SEEK IMMEDIATE MEDICAL CARE IF:   Your migraine becomes severe.  You have a fever.  You have a stiff neck.  You have vision loss.  You have muscular weakness or loss of muscle control.  You start losing your balance or have trouble walking.  You feel faint or pass out.  You have severe symptoms that are different from your first symptoms. MAKE SURE YOU:   Understand these instructions.  Will watch your condition.  Will get help right away if you are not doing well or get worse.   This information is not intended to replace advice given to you by your health care provider. Make sure you discuss any questions you have with your health care provider.   Document Released: 06/10/2005 Document  Revised: 07/01/2014 Document Reviewed: 02/15/2013 Elsevier Interactive Patient Education 2016 [employer]  Recurrent Migraine Headache A migraine headache is an intense, throbbing pain on one or both sides of your head. Recurrent migraines keep coming back. A migraine can last for 30 minutes to several hours. CAUSES  The exact cause of a migraine headache is not always known. However, a migraine  may be caused when nerves in the brain become irritated and release chemicals that cause inflammation. This causes pain. Certain things may also trigger migraines, such as:   Alcohol.  Smoking.  Stress.  Menstruation.  Aged cheeses.  Foods or drinks that contain nitrates, glutamate, aspartame, or tyramine.  Lack of sleep.  Chocolate.  Caffeine.  Hunger.  Physical exertion.  Fatigue.  Medicines used to treat chest pain (nitroglycerine), birth control pills, estrogen, and some blood pressure medicines. SYMPTOMS   Pain on one or both sides of your head.  Pulsating or throbbing pain.  Severe pain that prevents daily activities.  Pain that is aggravated by any physical activity.  Nausea, vomiting, or both.  Dizziness.  Pain with exposure to bright lights, loud noises, or activity.  General sensitivity to bright lights, loud noises, or smells. Before you get a migraine, you may get warning signs that a migraine is coming (aura). An aura may include:  Seeing flashing lights.  Seeing bright spots, halos, or zigzag lines.  Having tunnel vision or blurred vision.  Having feelings of numbness or tingling.  Having trouble talking.  Having muscle weakness. DIAGNOSIS  A recurrent migraine headache is often diagnosed based on:  Symptoms.  Physical examination.  A CT scan or MRI of your head. These imaging tests cannot diagnose migraines but can help rule out other causes of headaches.  TREATMENT  Medicines may be given for pain and nausea. Medicines can also be given to help prevent recurrent migraines. HOME CARE INSTRUCTIONS  Only take over-the-counter or prescription medicines for pain or discomfort as directed by your health care provider. The use of long-term narcotics is not recommended.  Lie down in a dark, quiet room when you have a migraine.  Keep a journal to find out what may trigger your migraine headaches. For example, write down:  What you  eat and drink.  How much sleep you get.  Any change to your diet or medicines.  Limit alcohol consumption.  Quit smoking if you smoke.  Get 7-9 hours of sleep, or as recommended by your health care provider.  Limit stress.  Keep lights dim if bright lights bother you and make your migraines worse. SEEK MEDICAL CARE IF:   You do not get relief from the medicines given to you.  You have a recurrence of pain.  You have a fever. SEEK IMMEDIATE MEDICAL CARE IF:  Your migraine becomes severe.  You have a stiff neck.  You have loss of vision.  You have muscular weakness or loss of muscle control.  You start losing your balance or have trouble walking.  You feel faint or pass out.  You have severe symptoms that are different from your first symptoms. MAKE SURE YOU:   Understand these instructions.  Will watch your condition.  Will get help right away if you are not doing well or get worse.   This information is not intended to replace advice given to you by your health care provider. Make sure you discuss any questions you have with your health care provider.   Document Released: 03/05/2001 Document Revised: 07/01/2014  Document Reviewed: 02/15/2013 Elsevier Interactive Patient Education [employer].

## 2015-06-25 NOTE — ED Notes (Signed)
MD at bedside. 

## 2015-06-25 NOTE — ED Notes (Signed)
Patient left at this time with all belongings. 

## 2015-06-27 ENCOUNTER — Encounter: Payer: Self-pay | Admitting: Family Medicine

## 2015-06-27 NOTE — Progress Notes (Signed)
   Subjective:    Patient ID: Elizabeth Phelps, female    DOB: [birth year], 60 y.o.   MRN: [id number]  HPI    Review of Systems     Objective:   Physical Exam        Assessment & Plan:  He was seen in the emergency room for evaluation of a migraine headache. She did get an MRA and MRI. She is now scheduled to see ophthalmology because she is still having some eye symptoms. A sample of Zomig nasal spray was given. If she continues to have difficulty with migraines I recommend she return here for more thorough evaluation.

## 2015-07-27 ENCOUNTER — Encounter: Payer: Self-pay | Admitting: Family Medicine

## 2015-07-27 ENCOUNTER — Ambulatory Visit (INDEPENDENT_AMBULATORY_CARE_PROVIDER_SITE_OTHER): Payer: BLUE CROSS/BLUE SHIELD | Admitting: Family Medicine

## 2015-07-27 VITALS — BP 114/70 | HR 88 | Wt 154.0 lb

## 2015-07-27 DIAGNOSIS — Z1239 Encounter for other screening for malignant neoplasm of breast: Secondary | ICD-10-CM | POA: Diagnosis not present

## 2015-07-27 DIAGNOSIS — Z79899 Other long term (current) drug therapy: Secondary | ICD-10-CM | POA: Diagnosis not present

## 2015-07-27 DIAGNOSIS — F341 Dysthymic disorder: Secondary | ICD-10-CM

## 2015-07-27 DIAGNOSIS — H269 Unspecified cataract: Secondary | ICD-10-CM

## 2015-07-27 DIAGNOSIS — Z1159 Encounter for screening for other viral diseases: Secondary | ICD-10-CM | POA: Diagnosis not present

## 2015-07-27 DIAGNOSIS — Z23 Encounter for immunization: Secondary | ICD-10-CM | POA: Diagnosis not present

## 2015-07-27 DIAGNOSIS — Z720 Tobacco use: Secondary | ICD-10-CM | POA: Diagnosis not present

## 2015-07-27 DIAGNOSIS — F17200 Nicotine dependence, unspecified, uncomplicated: Secondary | ICD-10-CM

## 2015-07-27 DIAGNOSIS — G43109 Migraine with aura, not intractable, without status migrainosus: Secondary | ICD-10-CM | POA: Diagnosis not present

## 2015-07-27 LAB — LIPID PANEL
CHOL/HDL RATIO: 8.1 ratio — AB (ref <=5.0)
Cholesterol: 282 mg/dL — ABNORMAL HIGH (ref 125–200)
HDL: 35 mg/dL — AB (ref >=46)
TRIGLYCERIDES: 514 mg/dL — AB (ref <150)

## 2015-07-27 MED ORDER — ZOLMITRIPTAN 5 MG PO TABS: 5.0000 mg | ORAL_TABLET | ORAL | Status: DC | PRN

## 2015-07-27 MED ORDER — PAROXETINE HCL 20 MG PO TABS: 20.0000 mg | ORAL_TABLET | Freq: Every day | ORAL | Status: DC

## 2015-07-27 NOTE — Progress Notes (Signed)
   Subjective:    Patient ID: Elizabeth Phelps, female    DOB: 05-28-1956, 60 y.o.   MRN: [id number]  HPI She is here for medication management visit. She has been under a lot stress. She works for a [employer] and that job is now over. Also a very close friend of hers died recently. She has history of migraine headaches then the seem to be under fairly good control. She was seen recently in the emergency room. Usually 800 mg of ibuprofen at the first sign of symptoms helps. She also has a cataract and is contemplating having surgery on that. She continues to do well on her Paxil and has on several occasions tried stop but has been unsuccessful with this. Otherwise she seems to be doing fairly well. She continues to smoke and at this point is not interested in quitting.   Review of Systems     Objective:   Physical Exam Alert and in no distress otherwise not examined       Assessment & Plan:  Cataract  Migraine with aura and without status migrainosus, not intractable - Plan: zolmitriptan (ZOMIG) 5 MG tablet  Dysthymia - Plan: PARoxetine (PAXIL) 20 MG tablet  Current smoker  Need for Tdap vaccination - Plan: Tdap vaccine greater than or equal to 7yo IM  Need for hepatitis C screening test - Plan: Hepatitis C antibody  Encounter for long-term (current) use of medications - Plan: Lipid panel  Screening for breast cancer - Plan: MM DIGITAL SCREENING BILATERAL Encouraged her to go ahead and get the cataract done as she is having some difficulty dealing with this. She seems to have a good handle on taking care of the migraine as well as her underlying dysthymia. We will discuss smoking cessation at a later date. Her immunizations and health maintenance were updated. Over 25 minutes, greater than 50% spent in counseling and coordination of care.

## 2015-07-28 LAB — HEPATITIS C ANTIBODY: HCV Ab: NEGATIVE

## 2015-08-01 ENCOUNTER — Encounter: Payer: Self-pay | Admitting: Family Medicine

## 2015-08-01 ENCOUNTER — Ambulatory Visit (INDEPENDENT_AMBULATORY_CARE_PROVIDER_SITE_OTHER): Payer: BLUE CROSS/BLUE SHIELD | Admitting: Family Medicine

## 2015-08-01 VITALS — BP 116/72 | HR 98 | Temp 98.1°F | Wt 151.0 lb

## 2015-08-01 DIAGNOSIS — E785 Hyperlipidemia, unspecified: Secondary | ICD-10-CM | POA: Diagnosis not present

## 2015-08-01 DIAGNOSIS — J069 Acute upper respiratory infection, unspecified: Secondary | ICD-10-CM | POA: Diagnosis not present

## 2015-08-01 MED ORDER — ATORVASTATIN CALCIUM 20 MG PO TABS: 20.0000 mg | ORAL_TABLET | Freq: Every day | ORAL | Status: DC

## 2015-08-01 NOTE — Progress Notes (Signed)
   Subjective:    Patient ID: Elizabeth Phelps, female    DOB: [birth year], 60 y.o.   MRN: [id number]  HPI She is here for consult concerning abnormal lipid panel. She also has a one-day history that started with cough, sore throat, nasal congestion and left earache. She was around a young child that spend constantly sick.   Review of Systems     Objective:   Physical Exam Alert and in no distress. Tympanic membranes are filled with wax and the canals are quite narrow. Pharyngeal area is normal. Neck is supple without adenopathy or thyromegaly. Cardiac exam shows a regular sinus rhythm without murmurs or gallops. Lungs are clear to auscultation.        Assessment & Plan:  Acute URI  Hyperlipidemia LDL goal <100 - Plan: atorvastatin (LIPITOR) 20 MG tablet

## 2015-08-02 ENCOUNTER — Ambulatory Visit: Payer: BLUE CROSS/BLUE SHIELD | Admitting: Family Medicine

## 2015-08-08 ENCOUNTER — Encounter: Payer: Self-pay | Admitting: Family Medicine

## 2015-08-08 ENCOUNTER — Ambulatory Visit (INDEPENDENT_AMBULATORY_CARE_PROVIDER_SITE_OTHER): Payer: BLUE CROSS/BLUE SHIELD | Admitting: Family Medicine

## 2015-08-08 VITALS — BP 122/78 | HR 75 | Wt 151.0 lb

## 2015-08-08 DIAGNOSIS — H6122 Impacted cerumen, left ear: Secondary | ICD-10-CM | POA: Diagnosis not present

## 2015-08-08 NOTE — Progress Notes (Signed)
   Subjective:    Patient ID: Elizabeth Phelps, female    DOB: 03/16/1956, 60 y.o.   MRN: [id number]  HPI She complains of difficulty with hearing from the left ear. She has been trying to clean her years out but with not much success.   Review of Systems     Objective:   Physical Exam Left canal is filled with cerumen.       Assessment & Plan:  Cerumen impaction, left The canal was lavaged with the cerumen easily removed. The tympanic membrane and canal are normal. Return here as needed.

## 2015-11-10 ENCOUNTER — Encounter: Payer: Self-pay | Admitting: Family Medicine

## 2015-11-10 ENCOUNTER — Ambulatory Visit (INDEPENDENT_AMBULATORY_CARE_PROVIDER_SITE_OTHER): Payer: BLUE CROSS/BLUE SHIELD | Admitting: Family Medicine

## 2015-11-10 VITALS — BP 114/70 | HR 70 | Wt 149.8 lb

## 2015-11-10 DIAGNOSIS — E785 Hyperlipidemia, unspecified: Secondary | ICD-10-CM

## 2015-11-10 LAB — LIPID PANEL
CHOL/HDL RATIO: 3.8 ratio (ref <=5.0)
CHOLESTEROL: 169 mg/dL (ref 125–200)
HDL: 45 mg/dL — AB (ref >=46)
LDL Cholesterol: 81 mg/dL (ref <130)
Triglycerides: 215 mg/dL — ABNORMAL HIGH (ref <150)
VLDL: 43 mg/dL — AB (ref <30)

## 2015-11-10 NOTE — Progress Notes (Signed)
   Subjective:    Patient ID: Elizabeth Phelps, female    DOB: 02/02/1956, 60 y.o.   MRN: [id number]  HPI  she is here for recheck. She has been taking Lipitor without difficulty. No muscle aches or pains or arthralgias.   Review of Systems     Objective:   Physical Exam  alert and in no distress otherwise not examined       Assessment & Plan:  Hyperlipidemia LDL goal <100 - Plan: Lipid panel

## 2016-04-15 DIAGNOSIS — H25043 Posterior subcapsular polar age-related cataract, bilateral: Secondary | ICD-10-CM | POA: Diagnosis not present

## 2016-04-15 DIAGNOSIS — H25041 Posterior subcapsular polar age-related cataract, right eye: Secondary | ICD-10-CM | POA: Diagnosis not present

## 2016-04-15 DIAGNOSIS — H43313 Vitreous membranes and strands, bilateral: Secondary | ICD-10-CM | POA: Diagnosis not present

## 2016-04-19 DIAGNOSIS — H25041 Posterior subcapsular polar age-related cataract, right eye: Secondary | ICD-10-CM | POA: Diagnosis not present

## 2016-04-19 DIAGNOSIS — E785 Hyperlipidemia, unspecified: Secondary | ICD-10-CM | POA: Diagnosis not present

## 2016-04-19 DIAGNOSIS — H269 Unspecified cataract: Secondary | ICD-10-CM | POA: Diagnosis not present

## 2016-04-19 DIAGNOSIS — F17210 Nicotine dependence, cigarettes, uncomplicated: Secondary | ICD-10-CM | POA: Diagnosis not present

## 2016-04-19 DIAGNOSIS — F419 Anxiety disorder, unspecified: Secondary | ICD-10-CM | POA: Diagnosis not present

## 2016-05-30 DIAGNOSIS — H43313 Vitreous membranes and strands, bilateral: Secondary | ICD-10-CM | POA: Diagnosis not present

## 2016-06-24 HISTORY — PX: CATARACT EXTRACTION: SUR2

## 2016-08-12 ENCOUNTER — Encounter: Payer: Self-pay | Admitting: Family Medicine

## 2016-08-12 ENCOUNTER — Ambulatory Visit (INDEPENDENT_AMBULATORY_CARE_PROVIDER_SITE_OTHER): Payer: BLUE CROSS/BLUE SHIELD | Admitting: Family Medicine

## 2016-08-12 VITALS — BP 120/80 | HR 70 | Ht 64.0 in | Wt 143.0 lb

## 2016-08-12 DIAGNOSIS — Z860100 Personal history of colon polyps, unspecified: Secondary | ICD-10-CM

## 2016-08-12 DIAGNOSIS — F341 Dysthymic disorder: Secondary | ICD-10-CM | POA: Diagnosis not present

## 2016-08-12 DIAGNOSIS — E785 Hyperlipidemia, unspecified: Secondary | ICD-10-CM

## 2016-08-12 DIAGNOSIS — F17200 Nicotine dependence, unspecified, uncomplicated: Secondary | ICD-10-CM | POA: Diagnosis not present

## 2016-08-12 DIAGNOSIS — G935 Compression of brain: Secondary | ICD-10-CM

## 2016-08-12 HISTORY — DX: Hyperlipidemia, unspecified: E78.5

## 2016-08-12 HISTORY — DX: Personal history of colonic polyps: Z86.010

## 2016-08-12 HISTORY — DX: Personal history of colon polyps, unspecified: Z86.0100

## 2016-08-12 MED ORDER — ATORVASTATIN CALCIUM 20 MG PO TABS: 20.0000 mg | ORAL_TABLET | Freq: Every day | ORAL | 0 refills | Status: DC

## 2016-08-12 MED ORDER — PAROXETINE HCL 20 MG PO TABS: 20.0000 mg | ORAL_TABLET | Freq: Every day | ORAL | 3 refills | Status: DC

## 2016-08-12 NOTE — Progress Notes (Signed)
   Subjective:    Patient ID: Elizabeth Phelps, female    DOB: 02-04-1956, 61 y.o.   MRN: [id number]  HPI She is here for medication check. She has had recent right cataract surgery and is doing well with this. She continues on Lipitor and has no difficulty with this. She also would like to stay on Paxil. She states that this does help keep her in a good mental state. She has history of migraine headache but has not had one in last several months. She is still smoking. She also has a history of Arnold-Chiari malformation. There is also history of colonic polyps however she is scheduled for repeat colonoscopy in 10 years. She does have a history of fusiform aneurysm and surgery was attempted several years ago without much success. Otherwise things seem to going quite well for her.   Review of Systems     Objective:   Physical Exam Alert and in no distress. Tympanic membranes and canals are normal. Pharyngeal area is normal. Neck is supple without adenopathy or thyromegaly. Cardiac exam shows a regular sinus rhythm without murmurs or gallops. Lungs are clear to auscultation.        Assessment & Plan:  Current smoker  Dysthymia - Plan: PARoxetine (PAXIL) 20 MG tablet  Hyperlipidemia LDL goal <100 - Plan: atorvastatin (LIPITOR) 20 MG tablet  Arnold-Chiari malformation, type I ([place])  History of colonic polyps At this point she is not interested in stopping her Paxil. I will continue her on her Lipitor. Schedule her for blood work in several months as she has been off of it for approximately 1 week. Will also discuss with neuroradiology potential to look at the aneurysm again and see if any new technologies and therapies would be appropriate. Not interested in quitting smoking.

## 2016-08-13 ENCOUNTER — Telehealth: Payer: Self-pay

## 2016-08-13 NOTE — Telephone Encounter (Signed)
Called pt to informed her of the conversation Dr.Lalonde had with the Neuroradiologist he doesn't think it needs to chased after pt verbalized understanding

## 2016-10-12 DIAGNOSIS — R51 Headache: Secondary | ICD-10-CM | POA: Diagnosis not present

## 2016-10-12 DIAGNOSIS — F17210 Nicotine dependence, cigarettes, uncomplicated: Secondary | ICD-10-CM | POA: Diagnosis not present

## 2016-10-12 DIAGNOSIS — X58XXXA Exposure to other specified factors, initial encounter: Secondary | ICD-10-CM | POA: Diagnosis not present

## 2016-10-12 DIAGNOSIS — W2209XA Striking against other stationary object, initial encounter: Secondary | ICD-10-CM | POA: Diagnosis not present

## 2016-10-12 DIAGNOSIS — R1110 Vomiting, unspecified: Secondary | ICD-10-CM | POA: Diagnosis not present

## 2016-10-12 DIAGNOSIS — S0990XA Unspecified injury of head, initial encounter: Secondary | ICD-10-CM | POA: Diagnosis not present

## 2016-10-12 DIAGNOSIS — R42 Dizziness and giddiness: Secondary | ICD-10-CM | POA: Diagnosis not present

## 2016-11-05 ENCOUNTER — Telehealth: Payer: Self-pay | Admitting: Family Medicine

## 2016-11-05 ENCOUNTER — Encounter: Payer: Self-pay | Admitting: Family Medicine

## 2016-11-05 ENCOUNTER — Ambulatory Visit (INDEPENDENT_AMBULATORY_CARE_PROVIDER_SITE_OTHER): Payer: BLUE CROSS/BLUE SHIELD | Admitting: Family Medicine

## 2016-11-05 VITALS — BP 122/70 | HR 78 | Ht 64.0 in | Wt 135.0 lb

## 2016-11-05 DIAGNOSIS — F17200 Nicotine dependence, unspecified, uncomplicated: Secondary | ICD-10-CM | POA: Diagnosis not present

## 2016-11-05 DIAGNOSIS — E785 Hyperlipidemia, unspecified: Secondary | ICD-10-CM

## 2016-11-05 DIAGNOSIS — Z79899 Other long term (current) drug therapy: Secondary | ICD-10-CM

## 2016-11-05 DIAGNOSIS — F341 Dysthymic disorder: Secondary | ICD-10-CM

## 2016-11-05 LAB — COMPREHENSIVE METABOLIC PANEL
ALBUMIN: 3.8 g/dL (ref 3.6–5.1)
ALK PHOS: 79 U/L (ref 33–130)
ALT: 60 U/L — ABNORMAL HIGH (ref 6–29)
AST: 67 U/L — ABNORMAL HIGH (ref 10–35)
BUN: 10 mg/dL (ref 7–25)
CHLORIDE: 108 mmol/L (ref 98–110)
CO2: 26 mmol/L (ref 20–31)
CREATININE: 0.68 mg/dL (ref 0.50–0.99)
Calcium: 9.5 mg/dL (ref 8.6–10.4)
Glucose, Bld: 88 mg/dL (ref 65–99)
Potassium: 4.3 mmol/L (ref 3.5–5.3)
Sodium: 140 mmol/L (ref 135–146)
TOTAL PROTEIN: 7.3 g/dL (ref 6.1–8.1)
Total Bilirubin: 0.7 mg/dL (ref 0.2–1.2)

## 2016-11-05 LAB — LIPID PANEL
CHOLESTEROL: 166 mg/dL (ref <200)
HDL: 48 mg/dL — ABNORMAL LOW (ref >50)
LDL Cholesterol: 95 mg/dL (ref <100)
TRIGLYCERIDES: 117 mg/dL (ref <150)
Total CHOL/HDL Ratio: 3.5 Ratio (ref <5.0)
VLDL: 23 mg/dL (ref <30)

## 2016-11-05 MED ORDER — ATORVASTATIN CALCIUM 20 MG PO TABS: 20.0000 mg | ORAL_TABLET | Freq: Every day | ORAL | 3 refills | Status: DC

## 2016-11-05 MED ORDER — PAROXETINE HCL 20 MG PO TABS: 20.0000 mg | ORAL_TABLET | Freq: Every day | ORAL | 3 refills | Status: DC

## 2016-11-05 NOTE — Progress Notes (Signed)
   Subjective:    Patient ID: Elizabeth Phelps, female    DOB: [birth year], 61 y.o.   MRN: [id number]  HPI She is here for recheck. She is now on her statin and having no difficulty with it. She continues smoke and again is not interested in quitting.   Review of Systems     Objective:   Physical Exam Alert and in no distress otherwise not examined       Assessment & Plan:  Current smoker  Hyperlipidemia LDL goal <100 - Plan: Comprehensive metabolic panel, Lipid panel  Encounter for long-term (current) use of medications - Plan: Comprehensive metabolic panel, Lipid panel

## 2016-11-05 NOTE — Telephone Encounter (Signed)
Pt would like to get her refills today if possible since she is leaving to go out of town today

## 2017-07-17 DIAGNOSIS — M79601 Pain in right arm: Secondary | ICD-10-CM | POA: Diagnosis not present

## 2017-07-17 DIAGNOSIS — G629 Polyneuropathy, unspecified: Secondary | ICD-10-CM | POA: Diagnosis not present

## 2017-07-17 DIAGNOSIS — R29898 Other symptoms and signs involving the musculoskeletal system: Secondary | ICD-10-CM | POA: Diagnosis not present

## 2017-09-04 DIAGNOSIS — J101 Influenza due to other identified influenza virus with other respiratory manifestations: Secondary | ICD-10-CM | POA: Diagnosis not present

## 2017-09-08 ENCOUNTER — Telehealth: Payer: Self-pay | Admitting: Family Medicine

## 2017-09-08 NOTE — Telephone Encounter (Signed)
Pt states she went to Urgent Care in [place] and they were supposed to fax records,  Issues with BP being low & heart racing and then slows down and feels like she's gonna pass out, she has lost about 30 lbs, hasn't been dieting.  Advised she needs appt and I scheduled her for friday.  Also asked that she have Urgent Care send records.

## 2017-09-12 ENCOUNTER — Ambulatory Visit: Payer: BLUE CROSS/BLUE SHIELD | Admitting: Family Medicine

## 2017-09-22 ENCOUNTER — Encounter: Payer: Self-pay | Admitting: Family Medicine

## 2017-09-22 ENCOUNTER — Ambulatory Visit: Payer: BLUE CROSS/BLUE SHIELD | Admitting: Family Medicine

## 2017-09-22 VITALS — BP 102/62 | HR 90 | Wt 130.0 lb

## 2017-09-22 DIAGNOSIS — F17200 Nicotine dependence, unspecified, uncomplicated: Secondary | ICD-10-CM

## 2017-09-22 DIAGNOSIS — Z79899 Other long term (current) drug therapy: Secondary | ICD-10-CM | POA: Diagnosis not present

## 2017-09-22 DIAGNOSIS — G43109 Migraine with aura, not intractable, without status migrainosus: Secondary | ICD-10-CM | POA: Diagnosis not present

## 2017-09-22 DIAGNOSIS — I4510 Unspecified right bundle-branch block: Secondary | ICD-10-CM

## 2017-09-22 DIAGNOSIS — E785 Hyperlipidemia, unspecified: Secondary | ICD-10-CM

## 2017-09-22 DIAGNOSIS — R000 Tachycardia, unspecified: Secondary | ICD-10-CM

## 2017-09-22 DIAGNOSIS — F341 Dysthymic disorder: Secondary | ICD-10-CM | POA: Diagnosis not present

## 2017-09-22 DIAGNOSIS — Z1211 Encounter for screening for malignant neoplasm of colon: Secondary | ICD-10-CM | POA: Diagnosis not present

## 2017-09-22 HISTORY — DX: Unspecified right bundle-branch block: I45.10

## 2017-09-22 NOTE — Progress Notes (Signed)
   Subjective:    Patient ID: Elizabeth Phelps, female    DOB: [birth year], 62 y.o.   MRN: [id number]  HPI She is here mainly for concerns over a long history of increased heart rate.  She states that she can have roughly 3 of these per week in the last roughly a minute and are associated with dizziness but no chest pain, weakness, diaphoresis, nausea.  Also for the last year the has had a job that keeps her quite physically active and she has lost some weight concerning this.  She has not had a colonoscopy due to her lack of interest in she does have a previous history of colonic polyp with the exact kind is unclear.one..  She continues on medications listed in the chart.  Her migraines seem to be under good control.  The Paxil keeps her psychologically stable.  She continues on Lipitor and having no difficulty with that.  She does smoke and at this point is not interested in quitting.  Review of Systems     Objective:   Physical Exam Alert and in no distress.  DTRs are  2-3+.  No tremor is noted.  Tympanic membranes and canals are normal. Pharyngeal area is normal. Neck is supple without adenopathy or thyromegaly. Cardiac exam shows a regular sinus rhythm without murmurs or gallops. Lungs are clear to auscultation. EKG does show an incomplete right bundle otherwise normal        Assessment & Plan:  Tachycardia - Plan: EKG 12-Lead, CBC with Differential/Platelet, Comprehensive metabolic panel, Lipid panel, TSH  Dysthymia  Hyperlipidemia LDL goal <100 - Plan: Lipid panel  Current smoker  Encounter for long-term (current) use of medications  Screening for colon cancer - Plan: Ambulatory referral to Gastroenterology  Migraine with aura and without status migrainosus, not intractable  Incomplete right bundle branch block The cause of her rapid heart rate is unclear.  I did recommend she keep track of her pulse rate when it happens so we can have a better feel for exactly what it is.

## 2017-09-23 ENCOUNTER — Other Ambulatory Visit: Payer: Self-pay | Admitting: Family Medicine

## 2017-09-23 ENCOUNTER — Other Ambulatory Visit: Payer: Self-pay

## 2017-09-23 ENCOUNTER — Encounter: Payer: Self-pay | Admitting: Gastroenterology

## 2017-09-23 DIAGNOSIS — R748 Abnormal levels of other serum enzymes: Secondary | ICD-10-CM

## 2017-09-23 LAB — COMPREHENSIVE METABOLIC PANEL
ALT: 203 IU/L — AB (ref 0–32)
AST: 272 IU/L — AB (ref 0–40)
Albumin/Globulin Ratio: 1 — ABNORMAL LOW (ref 1.2–2.2)
Albumin: 3.7 g/dL (ref 3.6–4.8)
Alkaline Phosphatase: 163 IU/L — ABNORMAL HIGH (ref 39–117)
BUN/Creatinine Ratio: 11 — ABNORMAL LOW (ref 12–28)
BUN: 8 mg/dL (ref 8–27)
Bilirubin Total: 0.5 mg/dL (ref 0.0–1.2)
CALCIUM: 9.1 mg/dL (ref 8.7–10.3)
CO2: 21 mmol/L (ref 20–29)
CREATININE: 0.7 mg/dL (ref 0.57–1.00)
Chloride: 103 mmol/L (ref 96–106)
GFR, EST AFRICAN AMERICAN: 108 mL/min/{1.73_m2} (ref >59)
GFR, EST NON AFRICAN AMERICAN: 94 mL/min/{1.73_m2} (ref >59)
GLOBULIN, TOTAL: 3.6 g/dL (ref 1.5–4.5)
Glucose: 76 mg/dL (ref 65–99)
Potassium: 4.4 mmol/L (ref 3.5–5.2)
Sodium: 141 mmol/L (ref 134–144)
TOTAL PROTEIN: 7.3 g/dL (ref 6.0–8.5)

## 2017-09-23 LAB — TSH: TSH: 1.76 u[IU]/mL (ref 0.450–4.500)

## 2017-09-23 LAB — CBC WITH DIFFERENTIAL/PLATELET
BASOS: 1 %
Basophils Absolute: 0 10*3/uL (ref 0.0–0.2)
EOS (ABSOLUTE): 0.1 10*3/uL (ref 0.0–0.4)
Eos: 1 %
Hematocrit: 40.9 % (ref 34.0–46.6)
Hemoglobin: 13.6 g/dL (ref 11.1–15.9)
IMMATURE GRANS (ABS): 0 10*3/uL (ref 0.0–0.1)
IMMATURE GRANULOCYTES: 0 %
LYMPHS: 30 %
Lymphocytes Absolute: 2.1 10*3/uL (ref 0.7–3.1)
MCH: 29 pg (ref 26.6–33.0)
MCHC: 33.3 g/dL (ref 31.5–35.7)
MCV: 87 fL (ref 79–97)
MONOCYTES: 13 %
MONOS ABS: 0.9 10*3/uL (ref 0.1–0.9)
NEUTROS PCT: 55 %
Neutrophils Absolute: 3.7 10*3/uL (ref 1.4–7.0)
Platelets: 220 10*3/uL (ref 150–379)
RBC: 4.69 x10E6/uL (ref 3.77–5.28)
RDW: 14.1 % (ref 12.3–15.4)
WBC: 6.9 10*3/uL (ref 3.4–10.8)

## 2017-09-23 LAB — LIPID PANEL
CHOL/HDL RATIO: 3.6 ratio (ref 0.0–4.4)
Cholesterol, Total: 154 mg/dL (ref 100–199)
HDL: 43 mg/dL (ref >39)
LDL CALC: 70 mg/dL (ref 0–99)
Triglycerides: 206 mg/dL — ABNORMAL HIGH (ref 0–149)
VLDL Cholesterol Cal: 41 mg/dL — ABNORMAL HIGH (ref 5–40)

## 2017-09-25 LAB — HEPATITIS PANEL, ACUTE
HEP A IGM: NEGATIVE
Hep B C IgM: NEGATIVE
Hepatitis B Surface Ag: NEGATIVE

## 2017-09-25 LAB — IRON AND TIBC
IRON SATURATION: 42 % (ref 15–55)
Iron: 138 ug/dL (ref 27–139)
TIBC: 327 ug/dL (ref 250–450)
UIBC: 189 ug/dL (ref 118–369)

## 2017-09-25 LAB — SPECIMEN STATUS REPORT

## 2017-09-26 ENCOUNTER — Telehealth: Payer: Self-pay | Admitting: Family Medicine

## 2017-09-26 NOTE — Telephone Encounter (Signed)
Pt is scheduled to have ultrasound of her liver and had labs on liver enzymes and pt has some questions and concerns and would like to speak to Dr Redmond School today if possible before the ultrasound on Monday. Call pt at [phone]

## 2017-09-29 ENCOUNTER — Telehealth: Payer: Self-pay | Admitting: Family Medicine

## 2017-09-29 ENCOUNTER — Ambulatory Visit (HOSPITAL_COMMUNITY)
Admission: RE | Admit: 2017-09-29 | Discharge: 2017-09-29 | Disposition: A | Discharge status: Home / Self Care | Payer: PRIVATE HEALTH INSURANCE | Source: Ambulatory Visit | Attending: Family Medicine | Admitting: Family Medicine

## 2017-09-29 DIAGNOSIS — R748 Abnormal levels of other serum enzymes: Secondary | ICD-10-CM

## 2017-09-29 NOTE — Telephone Encounter (Signed)
Pt drunk 3 sips of coffee this morning before her ultrasound and they would not do her ultrasound. It has been resched to tomorrow at 8am

## 2017-09-30 ENCOUNTER — Ambulatory Visit (HOSPITAL_COMMUNITY)
Admission: RE | Admit: 2017-09-30 | Discharge: 2017-09-30 | Disposition: A | Discharge status: Home / Self Care | Payer: BLUE CROSS/BLUE SHIELD | Source: Ambulatory Visit | Attending: Family Medicine | Admitting: Family Medicine

## 2017-09-30 ENCOUNTER — Telehealth: Payer: Self-pay

## 2017-09-30 ENCOUNTER — Other Ambulatory Visit: Payer: Self-pay

## 2017-09-30 DIAGNOSIS — R748 Abnormal levels of other serum enzymes: Secondary | ICD-10-CM | POA: Insufficient documentation

## 2017-09-30 DIAGNOSIS — R945 Abnormal results of liver function studies: Secondary | ICD-10-CM

## 2017-09-30 NOTE — Telephone Encounter (Signed)
Pt called to let her know that she has a CT scan for tomrrow. Pt is also aware to go by today and pick up contrast from [place]. Thanks [employer]

## 2017-10-01 ENCOUNTER — Ambulatory Visit (HOSPITAL_COMMUNITY)
Admission: RE | Admit: 2017-10-01 | Discharge: 2017-10-01 | Disposition: A | Discharge status: Home / Self Care | Payer: BLUE CROSS/BLUE SHIELD | Source: Ambulatory Visit | Attending: Family Medicine | Admitting: Family Medicine

## 2017-10-01 DIAGNOSIS — R945 Abnormal results of liver function studies: Secondary | ICD-10-CM | POA: Insufficient documentation

## 2017-10-01 DIAGNOSIS — R590 Localized enlarged lymph nodes: Secondary | ICD-10-CM | POA: Insufficient documentation

## 2017-10-01 DIAGNOSIS — K7460 Unspecified cirrhosis of liver: Secondary | ICD-10-CM | POA: Diagnosis not present

## 2017-10-01 DIAGNOSIS — I700 Atherosclerosis of aorta: Secondary | ICD-10-CM | POA: Diagnosis not present

## 2017-10-01 MED ORDER — IOPAMIDOL (ISOVUE-300) INJECTION 61%
100.0000 mL | Freq: Once | INTRAVENOUS | Status: AC | PRN
  Administered 2017-10-01: 100 mL via INTRAVENOUS

## 2017-10-01 MED ORDER — IOPAMIDOL (ISOVUE-300) INJECTION 61%
INTRAVENOUS | Status: AC
  Filled 2017-10-01: qty 100

## 2017-10-02 ENCOUNTER — Other Ambulatory Visit: Payer: Self-pay

## 2017-10-02 ENCOUNTER — Encounter: Payer: Self-pay | Admitting: Physician Assistant

## 2017-10-02 DIAGNOSIS — K7460 Unspecified cirrhosis of liver: Secondary | ICD-10-CM

## 2017-10-02 DIAGNOSIS — I700 Atherosclerosis of aorta: Secondary | ICD-10-CM | POA: Insufficient documentation

## 2017-10-02 HISTORY — DX: Atherosclerosis of aorta: I70.0

## 2017-10-20 ENCOUNTER — Ambulatory Visit: Payer: BLUE CROSS/BLUE SHIELD | Admitting: Physician Assistant

## 2017-10-20 ENCOUNTER — Other Ambulatory Visit (INDEPENDENT_AMBULATORY_CARE_PROVIDER_SITE_OTHER): Payer: BLUE CROSS/BLUE SHIELD

## 2017-10-20 ENCOUNTER — Encounter: Payer: Self-pay | Admitting: Physician Assistant

## 2017-10-20 VITALS — BP 100/74 | HR 80 | Ht 63.0 in | Wt 126.1 lb

## 2017-10-20 DIAGNOSIS — R1011 Right upper quadrant pain: Secondary | ICD-10-CM

## 2017-10-20 DIAGNOSIS — R634 Abnormal weight loss: Secondary | ICD-10-CM | POA: Diagnosis not present

## 2017-10-20 DIAGNOSIS — Z1211 Encounter for screening for malignant neoplasm of colon: Secondary | ICD-10-CM | POA: Diagnosis not present

## 2017-10-20 DIAGNOSIS — R1013 Epigastric pain: Secondary | ICD-10-CM

## 2017-10-20 DIAGNOSIS — K7469 Other cirrhosis of liver: Secondary | ICD-10-CM

## 2017-10-20 LAB — HEPATIC FUNCTION PANEL
ALBUMIN: 4.1 g/dL (ref 3.5–5.2)
ALK PHOS: 104 U/L (ref 39–117)
ALT: 56 U/L — ABNORMAL HIGH (ref 0–35)
AST: 78 U/L — AB (ref 0–37)
Bilirubin, Direct: 0.1 mg/dL (ref 0.0–0.3)
TOTAL PROTEIN: 8.4 g/dL — AB (ref 6.0–8.3)
Total Bilirubin: 0.7 mg/dL (ref 0.2–1.2)

## 2017-10-20 LAB — SEDIMENTATION RATE: Sed Rate: 25 mm/hr (ref 0–30)

## 2017-10-20 LAB — PROTIME-INR
INR: 1 ratio (ref 0.8–1.0)
Prothrombin Time: 12.1 s (ref 9.6–13.1)

## 2017-10-20 LAB — FERRITIN: Ferritin: 157.4 ng/mL (ref 10.0–291.0)

## 2017-10-20 MED ORDER — NA SULFATE-K SULFATE-MG SULF 17.5-3.13-1.6 GM/177ML PO SOLN: 1.0000 | Freq: Once | ORAL | 0 refills | Status: AC

## 2017-10-20 MED ORDER — PANTOPRAZOLE SODIUM 40 MG PO TBEC: DELAYED_RELEASE_TABLET | ORAL | 6 refills | Status: DC

## 2017-10-20 MED ORDER — PEG 3350-KCL-NA BICARB-NACL 420 G PO SOLR: ORAL | 0 refills | Status: DC

## 2017-10-20 MED ORDER — NA SULFATE-K SULFATE-MG SULF 17.5-3.13-1.6 GM/177ML PO SOLN: ORAL | 0 refills | Status: DC

## 2017-10-20 NOTE — Progress Notes (Signed)
Thank you for sending this case to me. I have reviewed the entire note, and the outlined plan seems appropriate.  To complete documentation, HAV IgM neg, HBV Sag neg, HCV neg.  Some of today's labs have returned, normal iron saturation and PT/INR.  Also, curiously, the AST and ALT have decreased from earlier this month.  She will need [employer] at office follow up.  Wilfrid Lund, MD

## 2017-10-20 NOTE — Progress Notes (Signed)
Subjective:    Patient ID: Elizabeth Phelps, female    DOB: July 28, 1955, 62 y.o.   MRN: [id number]  HPI Elizabeth Phelps is a pleasant 62 year old white female, new to GI today referred by Dr. Jill Alexanders with new diagnosis of cirrhosis and also for colon cancer surveillance. Patient has history of migraines, remote history of polyps and has an Arnold Chiari malformation. Patient states that she had colonoscopy done at age 62 somewhere in [place] but does not recall who did that procedure.  She may have been told to follow-up in 5 years but says she did not. Patient had a recent physical done and labs revealed elevated LFTs.  She subsequently had abdominal ultrasound on 09/30/2017 showing no gallstones or gallbladder wall thickening, CBD of 2 mm and mildly nodular liver, portal vein patent findings suspicious for cirrhosis. She then had CT of the abdomen and pelvis done on 10/01/2017 showing a nodular liver, no hepatic mass.  There was some mild perihepatic lymphadenopathy likely reactive moderate colonic diverticulosis. Review of her labs shows a 2 years ago she had an AST of 44.  1 year ago AST 67 and ALT of 60. On 09/30/2017 T bili 0.5, alk phos 163, AST 272, ALT 203.  CBC within normal limits, iron studies were normal.  Ferritin was not done.  Triglycerides 206 and cholesterol 154.  Hepatitis A, B, and C serologies are negative [facility] Patient has no family history of liver disease that she is aware of.  She says she has never been a regular drinker. Generally she has been feeling fine but says that she has had weight loss of about 25 pounds since around Christmas time.  She says she is currently very active in her job initially was trying to lose some weight, but but since wintertime and had some continued weight loss without being intentional.  She says her appetite had been decreased and she would just forget to eat.  She also complains of a feeling of fullness and bloating after eating any significant amount.   She has not had any vomiting.  Bowel movements have been normal, no melena or hematochezia. She just started drinking some protein shakes on a regular basis and says she feels a little bit better. She has been taking Advil about 3 tablets/day over-the-counter for tendinitis type symptoms in her right shoulder and has been doing this for several months.  Review of Systems Pertinent positive and negative review of systems were noted in the above HPI section.  All other review of systems was otherwise negative.  Outpatient Encounter Medications as of 10/20/2017  Medication Sig  . atorvastatin (LIPITOR) 20 MG tablet Take 1 tablet (20 mg total) by mouth daily.  Marland Kitchen ibuprofen (ADVIL,MOTRIN) 600 MG tablet Take 1 tablet (600 mg total) by mouth every 6 (six) hours as needed.  . Multiple Vitamin (MULTIVITAMIN WITH MINERALS) TABS Take 1 tablet by mouth daily.  Marland Kitchen PARoxetine (PAXIL) 20 MG tablet Take 1 tablet (20 mg total) by mouth daily.  Marland Kitchen zolmitriptan (ZOMIG) 5 MG tablet Take 1 tablet (5 mg total) by mouth as needed for migraine.  . Na Sulfate-K Sulfate-Mg Sulf 17.5-3.13-1.6 GM/177ML SOLN Take as directed for colonoscopy.  . Na Sulfate-K Sulfate-Mg Sulf 17.5-3.13-1.6 GM/177ML SOLN Take 1 kit by mouth once for 1 dose.  . pantoprazole (PROTONIX) 40 MG tablet Take 1 tab before breakfast.  . [DISCONTINUED] polyethylene glycol-electrolytes (NULYTELY/GOLYTELY) 420 g solution Take as directed for colonoscopy prep.   No facility-administered encounter medications on  file as of 10/20/2017.    Allergies  Allergen Reactions  . Codeine Nausea And Vomiting   Patient Active Problem List   Diagnosis Date Noted  . Atherosclerosis of aorta ([place]) 10/02/2017  . Incomplete right bundle branch block 09/22/2017  . Arnold-Chiari malformation, type I ([place]) 08/12/2016  . Hyperlipidemia LDL goal <100 08/12/2016  . History of colonic polyps 08/12/2016  . Current smoker 05/25/2013  . Migraine headache 01/08/2013  .  Dysthymia 04/03/2012   Social History   Socioeconomic History  . Marital status: Divorced    Spouse name: Not on file  . Number of children: 2  . Years of education: Not on file  . Highest education level: Not on file  Occupational History  . Occupation: Therapist, art  Social Needs  . Financial resource strain: Not on file  . Food insecurity:    Worry: Not on file    Inability: Not on file  . Transportation needs:    Medical: Not on file    Non-medical: Not on file  Tobacco Use  . Smoking status: Current Every Day Smoker  . Smokeless tobacco: Never Used  Substance and Sexual Activity  . Alcohol use: No  . Drug use: No  . Sexual activity: Not Currently  Lifestyle  . Physical activity:    Days per week: Not on file    Minutes per session: Not on file  . Stress: Not on file  Relationships  . Social connections:    Talks on phone: Not on file    Gets together: Not on file    Attends religious service: Not on file    Active member of club or organization: Not on file    Attends meetings of clubs or organizations: Not on file    Relationship status: Not on file  . Intimate partner violence:    Fear of current or ex partner: Not on file    Emotionally abused: Not on file    Physically abused: Not on file    Forced sexual activity: Not on file  Other Topics Concern  . Not on file  Social History Narrative  . Not on file    Elizabeth Phelps's family history includes Breast cancer in her maternal grandmother; Hypertension in her mother; Prostate cancer in her father; Stroke in her mother.      Objective:    Vitals:   10/20/17 1006  BP: 100/74  Pulse: 80    Physical Exam; well-developed white female in no acute distress, pleasant blood pressure 100/74 pulse 80, height 5 foot 3, weight 126, BMI 22.3.  HEENT; nontraumatic normocephalic EOMI PERRLA sclera anicteric, Cardiovascular; regular rate and rhythm with S1-S2 no murmur rub or gallop, Pulmonary; clear  bilaterally, Abdomen ;soft, bowel sounds are present there is no palpable mass or hepatosplenomegaly she is tender in the right upper quadrant.  Rectal ;exam not done, Extremities; no clubbing cyanosis or edema skin warm and dry,  Neuro psych; mood and affect appropriate       Assessment & Plan:   #58 62 year old white female with new diagnosis of cirrhosis which appears to be compensated by imaging and labs.  She does have elevated LFTs Etiology of cirrhosis is not clear, most likely this is related to nonalcoholic fatty liver disease/[place].  Will need to rule out autoimmune and inheritable forms of liver disease.  #2 weight loss, right upper quadrant discomfort ,and epigastric fullness and bloating postprandially.  Rule out NSAID induced gastropathy/peptic ulcer disease, or occult upper GI  neoplasm #3 colon cancer surveillance-last colonoscopy greater than 10 years ago.  Patient believes she was told to follow-up in 5 years but did not pursue. #4 diverticulosis #5 history of Arnold-Chiari malformation  Plan; Patient will be scheduled for upper endoscopy and colonoscopy with Dr. Loletha Carrow.  Both procedures were discussed in detail with patient including indications risks and benefits and she is agreeable to proceed Start Protonix 40 mg p.o. every morning Patient advised to decrease use of NSAIDs as much as possible Repeat hepatic panel today, check alpha-fetoprotein level, pro time/INR, ferritin, ceruloplasmin, alpha-1 antitrypsin level, AMA, anti-smooth muscle antibody, mitochondrial antibody  If endoscopic evaluation is unrevealing, may need to consider MRI of the abdomen/liver given weight loss right upper quadrant discomfort and mild perihepatic lymphadenopathy noted on CT.  Amy Genia Harold PA-C 10/20/2017   Cc: Denita Lung, MD

## 2017-10-20 NOTE — Patient Instructions (Addendum)
Your provider has requested that you go to the basement level for lab work before leaving today. Press "B" on the elevator. The lab is located at the first door on the left as you exit the elevator.  We have sent the following medications to your pharmacy for you to pick up at your convenience: 1. Suprep 2. Pantoprazole sodium 40 mg ( Protonix)   If you are age 62 or younger, your body mass index should be between 19-25. Your Body mass index is 22.34 kg/m. If this is out of the aformentioned range listed, please consider follow up with your Primary Care Provider.  You have been scheduled for an endoscopy and colonoscopy. Please follow the written instructions given to you at your visit today. Please pick up your prep supplies at the pharmacy within the next 1-3 days. If you use inhalers (even only as needed), please bring them with you on the day of your procedure.

## 2017-10-21 LAB — AFP TUMOR MARKER: AFP-Tumor Marker: 4.3 ng/mL

## 2017-10-21 LAB — ALPHA-1-ANTITRYPSIN: A1 ANTITRYPSIN SER: 178 mg/dL (ref 83–199)

## 2017-10-21 LAB — CERULOPLASMIN: Ceruloplasmin: 33 mg/dL (ref 18–53)

## 2017-10-22 LAB — ANTI-NUCLEAR AB-TITER (ANA TITER)

## 2017-10-22 LAB — ANA: ANA: POSITIVE — AB

## 2017-10-22 LAB — MITOCHONDRIAL ANTIBODIES

## 2017-10-23 LAB — ANTI-SMOOTH MUSCLE ANTIBODY, IGG: Actin (Smooth Muscle) Antibody (IGG): 27 U — ABNORMAL HIGH (ref <20)

## 2017-10-24 ENCOUNTER — Encounter: Payer: BLUE CROSS/BLUE SHIELD | Admitting: Gastroenterology

## 2017-10-24 ENCOUNTER — Encounter: Payer: Self-pay | Admitting: Gastroenterology

## 2017-10-27 ENCOUNTER — Telehealth: Payer: Self-pay | Admitting: Physician Assistant

## 2017-10-27 NOTE — Telephone Encounter (Signed)
Patient states she is returning Beth's call concerning lab results from 4.29.19.

## 2017-10-27 NOTE — Telephone Encounter (Signed)
Spoke with the patient

## 2017-11-05 ENCOUNTER — Encounter: Payer: Self-pay | Admitting: Gastroenterology

## 2017-11-05 ENCOUNTER — Ambulatory Visit (AMBULATORY_SURGERY_CENTER): Payer: BLUE CROSS/BLUE SHIELD | Admitting: Gastroenterology

## 2017-11-05 ENCOUNTER — Encounter

## 2017-11-05 ENCOUNTER — Other Ambulatory Visit: Payer: Self-pay

## 2017-11-05 VITALS — BP 110/74 | HR 68 | Temp 98.0°F | Resp 32 | Ht 63.0 in | Wt 126.0 lb

## 2017-11-05 DIAGNOSIS — K2980 Duodenitis without bleeding: Secondary | ICD-10-CM | POA: Diagnosis not present

## 2017-11-05 DIAGNOSIS — Z1211 Encounter for screening for malignant neoplasm of colon: Secondary | ICD-10-CM | POA: Diagnosis not present

## 2017-11-05 DIAGNOSIS — K7469 Other cirrhosis of liver: Secondary | ICD-10-CM

## 2017-11-05 MED ORDER — SODIUM CHLORIDE 0.9 % IV SOLN: 500.0000 mL | Freq: Once | INTRAVENOUS | Status: DC

## 2017-11-05 NOTE — Progress Notes (Signed)
Called to room to assist during endoscopic procedure.  Patient ID and intended procedure confirmed with present staff. Received instructions for my participation in the procedure from the performing physician.  

## 2017-11-05 NOTE — Op Note (Signed)
[facility] Patient Name: Elizabeth Phelps Procedure Date: 11/05/2017 3:01 PM MRN: [id number] Endoscopist: Mallie Mussel L. Loletha Carrow , MD Age: 62 Referring MD:  Date of Birth: 04/01/1956 Gender: Female Account #: [id number] Procedure:                Colonoscopy Indications:              Screening for colorectal malignant neoplasm, This                            is the patient's first colonoscopy Medicines:                Monitored Anesthesia Care Procedure:                Pre-Anesthesia Assessment:                           - Prior to the procedure, a History and Physical                            was performed, and patient medications and                            allergies were reviewed. The patient's tolerance of                            previous anesthesia was also reviewed. The risks                            and benefits of the procedure and the sedation                            options and risks were discussed with the patient.                            All questions were answered, and informed consent                            was obtained. Prior Anticoagulants: The patient has                            taken no previous anticoagulant or antiplatelet                            agents. ASA Grade Assessment: III - A patient with                            severe systemic disease. After reviewing the risks                            and benefits, the patient was deemed in                            satisfactory condition to undergo the procedure.  After obtaining informed consent, the colonoscope                            was passed under direct vision. Throughout the                            procedure, the patient's blood pressure, pulse, and                            oxygen saturations were monitored continuously. The                            Model PCF-[id number]) scope was introduced                            through the anus and  advanced to the the cecum,                            identified by appendiceal orifice and ileocecal                            valve. The colonoscopy was performed with                            difficulty due to multiple diverticula in the                            colon. Successful completion of the procedure was                            aided by withdrawing the scope and replacing with                            the pediatric colonoscope. The patient tolerated                            the procedure well. The quality of the bowel                            preparation was good. The ileocecal valve,                            appendiceal orifice, and rectum were photographed.                            The quality of the bowel preparation was evaluated                            using the BBPS [facility] Bowel Preparation Scale)                            with scores of: Right Colon = 2, Transverse Colon =  2 and Left Colon = 2. The total BBPS score equals                            6.,after lavage. The bowel preparation used was                            SUPREP. Scope In: 3:26:45 PM Scope Out: 3:38:40 PM Scope Withdrawal Time: 0 hours 8 minutes 54 seconds  Total Procedure Duration: 0 hours 11 minutes 55 seconds  Findings:                 The perianal and digital rectal examinations were                            normal.                           Multiple medium-mouthed diverticula were found in                            the left colon.                           The exam was otherwise without abnormality on                            direct and retroflexion views. Complications:            No immediate complications. Estimated Blood Loss:     Estimated blood loss: none. Impression:               - Diverticulosis in the left colon.                           - The examination was otherwise normal on direct                            and retroflexion  views.                           - No specimens collected. Recommendation:           - Patient has a contact number available for                            emergencies. The signs and symptoms of potential                            delayed complications were discussed with the                            patient. Return to normal activities tomorrow.                            Written discharge instructions were provided to the  patient.                           - Resume previous diet.                           - Continue present medications.                           - Repeat colonoscopy in 10 years for screening                            purposes. Henry L. Loletha Carrow, MD 11/05/2017 3:55:32 PM This report has been signed electronically.

## 2017-11-05 NOTE — Patient Instructions (Signed)
Read handouts on Diverticulosis   YOU HAD AN ENDOSCOPIC PROCEDURE TODAY: Refer to the procedure report and other information in the discharge instructions given to you for any specific questions about what was found during the examination. If this information does not answer your questions, please call [place] office at [phone] to clarify.   YOU SHOULD EXPECT: Some feelings of bloating in the abdomen. Passage of more gas than usual. Walking can help get rid of the air that was put into your GI tract during the procedure and reduce the bloating. If you had a lower endoscopy (such as a colonoscopy or flexible sigmoidoscopy) you may notice spotting of blood in your stool or on the toilet paper. Some abdominal soreness may be present for a day or two, also.  DIET: Your first meal following the procedure should be a light meal and then it is ok to progress to your normal diet. A half-sandwich or bowl of soup is an example of a good first meal. Heavy or fried foods are harder to digest and may make you feel nauseous or bloated. Drink plenty of fluids but you should avoid alcoholic beverages for 24 hours. If you had a esophageal dilation, please see attached instructions for diet.    ACTIVITY: Your care partner should take you home directly after the procedure. You should plan to take it easy, moving slowly for the rest of the day. You can resume normal activity the day after the procedure however YOU SHOULD NOT DRIVE, use power tools, machinery or perform tasks that involve climbing or major physical exertion for 24 hours (because of the sedation medicines used during the test).   SYMPTOMS TO REPORT IMMEDIATELY: A gastroenterologist can be reached at any hour. Please call [phone]  for any of the following symptoms:  Following lower endoscopy (colonoscopy, flexible sigmoidoscopy) Excessive amounts of blood in the stool  Significant tenderness, worsening of abdominal pains  Swelling of the abdomen  that is new, acute  Fever of 100 or higher  Following upper endoscopy (EGD, EUS, ERCP, esophageal dilation) Vomiting of blood or coffee ground material  New, significant abdominal pain  New, significant chest pain or pain under the shoulder blades  Painful or persistently difficult swallowing  New shortness of breath  Black, tarry-looking or red, bloody stools  FOLLOW UP:  If any biopsies were taken you will be contacted by phone or by letter within the next 1-3 weeks. Call [phone]  if you have not heard about the biopsies in 3 weeks.  Please also call with any specific questions about appointments or follow up tests.

## 2017-11-05 NOTE — Op Note (Signed)
[facility] Patient Name: Elizabeth Phelps Procedure Date: 11/05/2017 3:02 PM MRN: [id number] Endoscopist: Mallie Mussel L. Loletha Carrow , MD Age: 62 Referring MD:  Date of Birth: Nov 30, 1955 Gender: Female Account #: [id number] Procedure:                Upper GI endoscopy Indications:              newly-diagnosed cirrhosis,screen for esophageal                            varices Medicines:                Monitored Anesthesia Care Procedure:                Pre-Anesthesia Assessment:                           - Prior to the procedure, a History and Physical                            was performed, and patient medications and                            allergies were reviewed. The patient's tolerance of                            previous anesthesia was also reviewed. The risks                            and benefits of the procedure and the sedation                            options and risks were discussed with the patient.                            All questions were answered, and informed consent                            was obtained. Prior Anticoagulants: The patient has                            taken no previous anticoagulant or antiplatelet                            agents. ASA Grade Assessment: III - A patient with                            severe systemic disease. After reviewing the risks                            and benefits, the patient was deemed in                            satisfactory condition to undergo the procedure.  After obtaining informed consent, the endoscope was                            passed under direct vision. Throughout the                            procedure, the patient's blood pressure, pulse, and                            oxygen saturations were monitored continuously. The                            Endoscope was introduced through the mouth, and                            advanced to the second part of duodenum. The  upper                            GI endoscopy was accomplished without difficulty.                            The patient tolerated the procedure well. Scope In: Scope Out: Findings:                 The esophagus was normal.                           There is no endoscopic evidence of varices in the                            entire esophagus.                           Mild portal hypertensive gastropathy was found in                            the gastric body.                           The cardia and gastric fundus were normal on                            retroflexion.No gastric varices were seen.                           Patchy mildly congested mucosa without active                            bleeding and with no stigmata of bleeding was found                            in the second portion of the duodenum. Biopsies for                            histology were taken with a cold forceps  for                            evaluation of celiac disease.                           The exam of the duodenum was otherwise normal. Complications:            No immediate complications. Estimated Blood Loss:     Estimated blood loss was minimal. Impression:               - Normal esophagus.                           - Portal hypertensive gastropathy.                           - Congested duodenal mucosa. Biopsied. Non-specific                            finding of questionable clinical significance. Recommendation:           - Patient has a contact number available for                            emergencies. The signs and symptoms of potential                            delayed complications were discussed with the                            patient. Return to normal activities tomorrow.                            Written discharge instructions were provided to the                            patient.                           - Resume previous diet.                           - Continue present  medications.                           - Await pathology results. Office follow up will                            then be arranged.                           - See the other procedure note for documentation of                            additional recommendations.                           -  Repeat upper endoscopy in 3 years for screening                            purposes. Marijah Larranaga L. Loletha Carrow, MD 11/05/2017 3:53:35 PM This report has been signed electronically.

## 2017-11-05 NOTE — Progress Notes (Signed)
To PACU, VSS. Report to RN.tb 

## 2017-11-05 NOTE — Progress Notes (Signed)
Pt's states no medical or surgical changes since previsit or office visit. 

## 2017-11-06 ENCOUNTER — Telehealth: Payer: Self-pay

## 2017-11-06 NOTE — Telephone Encounter (Signed)
  Follow up Call-  Call Susannah Carbin number 11/05/2017  Post procedure Call Tremel Setters phone  # [phone]  Permission to leave phone message Yes  Some recent data might be hidden     Patient questions:  Do you have a fever, pain , or abdominal swelling? No. Pain Score  0 *  Have you tolerated food without any problems? Yes.    Have you been able to return to your normal activities? Yes.    Do you have any questions about your discharge instructions: Diet   No. Medications  No. Follow up visit  No.  Do you have questions or concerns about your Care? No.  Actions: * If pain score is 4 or above: No action needed, pain <4.

## 2017-11-06 NOTE — Telephone Encounter (Signed)
Left message

## 2017-11-22 ENCOUNTER — Other Ambulatory Visit: Payer: Self-pay | Admitting: Family Medicine

## 2017-11-22 DIAGNOSIS — E785 Hyperlipidemia, unspecified: Secondary | ICD-10-CM

## 2017-11-25 ENCOUNTER — Telehealth: Payer: Self-pay

## 2017-11-25 ENCOUNTER — Telehealth: Payer: Self-pay | Admitting: Family Medicine

## 2017-11-25 DIAGNOSIS — E785 Hyperlipidemia, unspecified: Secondary | ICD-10-CM

## 2017-11-25 DIAGNOSIS — F341 Dysthymic disorder: Secondary | ICD-10-CM

## 2017-11-25 MED ORDER — ATORVASTATIN CALCIUM 20 MG PO TABS: 20.0000 mg | ORAL_TABLET | Freq: Every day | ORAL | 0 refills | Status: DC

## 2017-11-25 MED ORDER — PAROXETINE HCL 20 MG PO TABS: 20.0000 mg | ORAL_TABLET | Freq: Every day | ORAL | 3 refills | Status: DC

## 2017-11-25 NOTE — Telephone Encounter (Signed)
Pt would like to have a refill on her paxil sent to [facility] in [place]. Please advise kH

## 2017-11-25 NOTE — Telephone Encounter (Signed)
Sent Dr Redmond School the paxil but liptor has been sent in for pt. Will call once he response to the message [facility]

## 2017-11-25 NOTE — Telephone Encounter (Signed)
Pt called and states that she would like her medicines resent to the [place], [place] - [address] she needs to Lipitor and the Paxil she can be reached at [phone]

## 2017-12-12 ENCOUNTER — Encounter: Payer: Self-pay | Admitting: Gastroenterology

## 2017-12-12 ENCOUNTER — Ambulatory Visit: Payer: BLUE CROSS/BLUE SHIELD | Admitting: Gastroenterology

## 2017-12-12 ENCOUNTER — Other Ambulatory Visit: Payer: BLUE CROSS/BLUE SHIELD

## 2017-12-12 VITALS — BP 90/62 | HR 72 | Ht 63.0 in | Wt 128.0 lb

## 2017-12-12 DIAGNOSIS — R748 Abnormal levels of other serum enzymes: Secondary | ICD-10-CM

## 2017-12-12 DIAGNOSIS — K7460 Unspecified cirrhosis of liver: Secondary | ICD-10-CM | POA: Diagnosis not present

## 2017-12-12 NOTE — Progress Notes (Signed)
[facility] Progress Note  Chief Complaint: Cirrhosis  Subjective  History:  Elizabeth Phelps is here to discuss her recent diagnosis of cirrhosis and test results. Upper endoscopy on May 15 showed no esophageal or gastric varices.  There was mild portal gastropathy as well as nonspecific duodenal mucosal edema, biopsy which was normal.  Colonoscopy revealed no polyps and only diverticulosis.  Recommendation was for repeat screening colonoscopy in 10 years, and repeat EGD in 3 years. She continues to feel well except for fatigue.  She also reports significant weight loss since earlier this year, but says she is very active getting between 18,000 and 20,000 steps per day.  Of note, the chart indicates her weight is been stable since at least early April.  ROS: Cardiovascular:  no chest pain Respiratory: no dyspnea  The patient's Past Medical, Family and Social History were reviewed and are on file in the EMR.  Objective:  Med list reviewed  Current Outpatient Medications:  .  atorvastatin (LIPITOR) 20 MG tablet, Take 1 tablet (20 mg total) by mouth daily., Disp: 150 tablet, Rfl: 0 .  ibuprofen (ADVIL,MOTRIN) 600 MG tablet, Take 1 tablet (600 mg total) by mouth every 6 (six) hours as needed., Disp: 30 tablet, Rfl: 0 .  Multiple Vitamin (MULTIVITAMIN WITH MINERALS) TABS, Take 1 tablet by mouth daily., Disp: , Rfl:  .  Na Sulfate-K Sulfate-Mg Sulf 17.5-3.13-1.6 GM/177ML SOLN, Take as directed for colonoscopy., Disp: 354 mL, Rfl: 0 .  pantoprazole (PROTONIX) 40 MG tablet, Take 1 tab before breakfast., Disp: 30 tablet, Rfl: 6 .  PARoxetine (PAXIL) 20 MG tablet, Take 1 tablet (20 mg total) by mouth daily., Disp: 90 tablet, Rfl: 3 .  zolmitriptan (ZOMIG) 5 MG tablet, Take 1 tablet (5 mg total) by mouth as needed for migraine., Disp: 10 tablet, Rfl: 5  Current Facility-Administered Medications:  .  0.9 %  sodium chloride infusion, 500 mL, Intravenous, Once, Danis, Estill Cotta III, MD   Vital  signs in last 24 hrs: Vitals:   12/12/17 1605  BP: 90/62  Pulse: 72    Physical Exam   HEENT: sclera anicteric, oral mucosa moist without lesions  Neck: supple, no thyromegaly, JVD or lymphadenopathy  Cardiac: RRR without murmurs, S1S2 heard, no peripheral edema  Pulm: clear to auscultation bilaterally, normal RR and effort noted  Abdomen: soft, no tenderness, with active bowel sounds.  Left lobe liver enlarged, 3 fingerbreadths on inspiration, no spleen tip palpable.  No bulging flanks or distention.  Skin; warm and dry, no jaundice or rash.  Few spider nevi upper chest wall  Recent Labs:  CMP Latest Ref Rng & Units 10/20/2017 09/22/2017 11/05/2016  Glucose 65 - 99 mg/dL - 76 88  BUN 8 - 27 mg/dL - 8 10  Creatinine 0.57 - 1.00 mg/dL - 0.70 0.68  Sodium 134 - 144 mmol/L - 141 140  Potassium 3.5 - 5.2 mmol/L - 4.4 4.3  Chloride 96 - 106 mmol/L - 103 108  CO2 20 - 29 mmol/L - 21 26  Calcium 8.7 - 10.3 mg/dL - 9.1 9.5  Total Protein 6.0 - 8.3 g/dL 8.4(H) 7.3 7.3  Total Bilirubin 0.2 - 1.2 mg/dL 0.7 0.5 0.7  Alkaline Phos 39 - 117 U/L 104 163(H) 79  AST 0 - 37 U/L 78(H) 272(H) 67(H)  ALT 0 - 35 U/L 56(H) 203(H) 60(H)   CBC Latest Ref Rng & Units 09/22/2017 06/25/2015 09/03/2011  WBC 3.4 - 10.8 x10E3/uL 6.9 6.4 6.9  Hemoglobin  11.1 - 15.9 g/dL 13.6 14.3 14.5  Hematocrit 34.0 - 46.6 % 40.9 43.4 44.0  Platelets 150 - 379 x10E3/uL 220 245 253   Pos ANA 1:1280 AMA 27 (elevated) AMA neg AFP 4.3 INR 1.0 10/20/17 HAV neg IgM,  HBV Sag neg, Core IgM neg Wt stable since early April   @ASSESSMENTPLANBEGIN @ Assessment: Encounter Diagnoses  Name Primary?  . Cirrhosis of liver without ascites, unspecified hepatic cirrhosis type ([place]) Yes  . 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 We discussed cirrhosis, its potential complications, and she currently is compensated without any complications.  It is not clear that  she is really lost as much weight as she perceives.  Recent CT scan showed no mass in the liver. I feel she needs a liver biopsy to be certain of her diagnosis and plan therapy.   Plan: Hepatitis B surface antibody and hepatitis A IgG antibody to see if she needs [facility] Liver biopsy will be scheduled.  I discussed the nature of this procedure, and she will have further discussion regarding risks and benefits with the interventional radiologist.  I will make plans to have her follow-up with me after I review those results.  Total time 30 minutes, over half spent face-to-face with patient in counseling and coordination of care.  Issues related to cirrhosis, suspected diagnosis and further work-up were discussed.  She will need to get a flu shot annually, and see primary care for a pneumococcal vaccine.  She should also avoid eating raw fish such as sushi because of risk of contracting a particular Vibrio infection.  Nelida Meuse III

## 2017-12-12 NOTE — Patient Instructions (Signed)
If you are age 62 or older, your body mass index should be between 23-30. Your Body mass index is 22.67 kg/m. If this is out of the aforementioned range listed, please consider follow up with your Primary Care Provider.  If you are age 52 or younger, your body mass index should be between 19-25. Your Body mass index is 22.67 kg/m. If this is out of the aformentioned range listed, please consider follow up with your Primary Care Provider.   We will contact you to scheduled a CT guided liver biopsy.   If you are age 47 or older, your body mass index should be between 23-30. Your Body mass index is 22.67 kg/m. If this is out of the aforementioned range listed, please consider follow up with your Primary Care Provider.  If you are age 28 or younger, your body mass index should be between 19-25. Your Body mass index is 22.67 kg/m. If this is out of the aformentioned range listed, please consider follow up with your Primary Care Provider.   It was a pleasure to see you today!  Dr. Loletha Carrow

## 2017-12-13 LAB — IGG: IgG (Immunoglobin G), Serum: 1763 mg/dL — ABNORMAL HIGH (ref 694–1618)

## 2017-12-13 LAB — HEPATITIS B SURFACE ANTIBODY,QUALITATIVE: Hep B S Ab: NONREACTIVE

## 2017-12-13 LAB — HEPATITIS A ANTIBODY, TOTAL: Hepatitis A AB,Total: REACTIVE — AB

## 2017-12-15 ENCOUNTER — Encounter: Payer: PRIVATE HEALTH INSURANCE | Admitting: Gastroenterology

## 2017-12-15 ENCOUNTER — Other Ambulatory Visit: Payer: Self-pay

## 2017-12-22 ENCOUNTER — Other Ambulatory Visit: Payer: Self-pay

## 2017-12-22 ENCOUNTER — Telehealth: Payer: Self-pay | Admitting: Gastroenterology

## 2017-12-22 NOTE — Telephone Encounter (Signed)
Patient states that she started having severe abdominal pain, vomited x 2 last night. Patient also has started having today BRB per rectum when she tries to have BM. Last normal BM yesterday a.m. Patient denies any SOB, chest pain, dizziness, no hematemesis. Advised patient, since she did not know, to find out where nearest hospital is located, let her know I would call her back. Patient lives over an hour away, please advise.

## 2017-12-22 NOTE — Telephone Encounter (Signed)
Spoke to patient, she is now able to take PO liquids and is feeling better, she states that the rectal bleeding is much lighter, if not altogether stopped. She is aware of instructions to go to ED for evaluation especially if symptoms do not stop completely or worsen. She did locate the nearest hospital and ED to where she lives. She is still waiting on appointment for the liver biopsy. Explained that Dr. Loletha Carrow just returned to the office today and Vivien Rota had some questions about hepatitis injection and liver biopsy appointment being on the same day. I let her know that I would send a message to [place].

## 2017-12-22 NOTE — Telephone Encounter (Signed)
Yes, I agree she should go to the nearest hospital emergency department. In case it is pertinent to wha tis going on, she should be sure to inform them that she has recently-diagnosed cirrhosis.

## 2017-12-23 ENCOUNTER — Other Ambulatory Visit: Payer: Self-pay

## 2017-12-23 DIAGNOSIS — R748 Abnormal levels of other serum enzymes: Secondary | ICD-10-CM

## 2017-12-23 DIAGNOSIS — K7469 Other cirrhosis of liver: Secondary | ICD-10-CM

## 2017-12-23 DIAGNOSIS — K7460 Unspecified cirrhosis of liver: Secondary | ICD-10-CM

## 2017-12-29 ENCOUNTER — Other Ambulatory Visit: Payer: Self-pay | Admitting: Radiology

## 2017-12-30 ENCOUNTER — Ambulatory Visit (HOSPITAL_COMMUNITY)
Admission: RE | Admit: 2017-12-30 | Discharge: 2017-12-30 | Disposition: A | Discharge status: Home / Self Care | Payer: BLUE CROSS/BLUE SHIELD | Source: Ambulatory Visit | Attending: Gastroenterology | Admitting: Gastroenterology

## 2017-12-30 ENCOUNTER — Encounter (HOSPITAL_COMMUNITY): Payer: Self-pay

## 2017-12-30 DIAGNOSIS — G43909 Migraine, unspecified, not intractable, without status migrainosus: Secondary | ICD-10-CM | POA: Insufficient documentation

## 2017-12-30 DIAGNOSIS — R7989 Other specified abnormal findings of blood chemistry: Secondary | ICD-10-CM | POA: Diagnosis not present

## 2017-12-30 DIAGNOSIS — Z79899 Other long term (current) drug therapy: Secondary | ICD-10-CM | POA: Insufficient documentation

## 2017-12-30 DIAGNOSIS — R748 Abnormal levels of other serum enzymes: Secondary | ICD-10-CM

## 2017-12-30 DIAGNOSIS — G935 Compression of brain: Secondary | ICD-10-CM | POA: Diagnosis not present

## 2017-12-30 DIAGNOSIS — F419 Anxiety disorder, unspecified: Secondary | ICD-10-CM | POA: Diagnosis not present

## 2017-12-30 DIAGNOSIS — K7460 Unspecified cirrhosis of liver: Secondary | ICD-10-CM

## 2017-12-30 DIAGNOSIS — K740 Hepatic fibrosis: Secondary | ICD-10-CM | POA: Diagnosis not present

## 2017-12-30 DIAGNOSIS — K7469 Other cirrhosis of liver: Secondary | ICD-10-CM | POA: Diagnosis not present

## 2017-12-30 DIAGNOSIS — F17200 Nicotine dependence, unspecified, uncomplicated: Secondary | ICD-10-CM | POA: Insufficient documentation

## 2017-12-30 LAB — APTT: aPTT: 33 seconds (ref 24–36)

## 2017-12-30 LAB — CBC
HEMATOCRIT: 43.3 % (ref 36.0–46.0)
HEMOGLOBIN: 14.3 g/dL (ref 12.0–15.0)
MCH: 28.9 pg (ref 26.0–34.0)
MCHC: 33 g/dL (ref 30.0–36.0)
MCV: 87.5 fL (ref 78.0–100.0)
Platelets: 196 10*3/uL (ref 150–400)
RBC: 4.95 MIL/uL (ref 3.87–5.11)
RDW: 13.8 % (ref 11.5–15.5)
WBC: 6.4 10*3/uL (ref 4.0–10.5)

## 2017-12-30 LAB — PROTIME-INR
INR: 1.17
Prothrombin Time: 14.8 seconds (ref 11.4–15.2)

## 2017-12-30 MED ORDER — FENTANYL CITRATE (PF) 100 MCG/2ML IJ SOLN
INTRAMUSCULAR | Status: AC | PRN
  Administered 2017-12-30 (×2): 50 ug via INTRAVENOUS

## 2017-12-30 MED ORDER — SODIUM CHLORIDE 0.9 % IV SOLN
INTRAVENOUS | Status: DC
  Administered 2017-12-30: 11:00:00 via INTRAVENOUS

## 2017-12-30 MED ORDER — GELATIN ABSORBABLE 12-7 MM EX MISC
CUTANEOUS | Status: AC
  Filled 2017-12-30: qty 1

## 2017-12-30 MED ORDER — LIDOCAINE-EPINEPHRINE (PF) 2 %-1:200000 IJ SOLN
INTRAMUSCULAR | Status: AC
  Filled 2017-12-30: qty 20

## 2017-12-30 MED ORDER — MIDAZOLAM HCL 2 MG/2ML IJ SOLN
INTRAMUSCULAR | Status: AC | PRN
  Administered 2017-12-30 (×2): 1 mg via INTRAVENOUS

## 2017-12-30 MED ORDER — NALOXONE HCL 0.4 MG/ML IJ SOLN
INTRAMUSCULAR | Status: AC
  Filled 2017-12-30: qty 1

## 2017-12-30 MED ORDER — LIDOCAINE HCL (PF) 1 % IJ SOLN
INTRAMUSCULAR | Status: AC | PRN
  Administered 2017-12-30: 10 mL

## 2017-12-30 MED ORDER — FLUMAZENIL 0.5 MG/5ML IV SOLN
INTRAVENOUS | Status: AC
  Filled 2017-12-30: qty 5

## 2017-12-30 MED ORDER — MIDAZOLAM HCL 2 MG/2ML IJ SOLN
INTRAMUSCULAR | Status: AC
  Filled 2017-12-30: qty 2

## 2017-12-30 MED ORDER — FENTANYL CITRATE (PF) 100 MCG/2ML IJ SOLN
INTRAMUSCULAR | Status: AC
  Filled 2017-12-30: qty 2

## 2017-12-30 NOTE — Procedures (Signed)
Pre Procedure Dx: Cirrhosis Post Procedural Dx: Same  Technically successful US guided biopsy of right lobe of the liver.  EBL: None  No immediate complications.   Jay Itsel Opfer, MD Pager #: [phone]    

## 2017-12-30 NOTE — H&P (Signed)
Referring Physician(s): Nelida Meuse III  Supervising Physician: Sandi Mariscal  Patient Status:  [facility] OP  Chief Complaint: "I'm having a liver biopsy"  Subjective: Patient familiar to IR service from prior cerebral arteriogram in 2002.  She has a history of cirrhosis by imaging along with elevated LFTs and presents today for ultrasound-guided random liver biopsy for further evaluation.  She currently denies fever, headache, chest pain, dyspnea, cough, abdominal/back pain, nausea, vomiting or bleeding.  She continues to smoke.  Past Medical History:  Diagnosis Date  . Anxiety   . Arnold-Chiari malformation, type I ([place])   . Brain aneurysm   . Migraine headache   . Personal history of colonic polyps 11/07  . Smoker    Past Surgical History:  Procedure Laterality Date  . APPENDECTOMY    . BRAIN SURGERY    . CATARACT EXTRACTION Right 2018  . COLONOSCOPY  2007   Dr. Collene Mares  . KNEE ARTHROSCOPY Right   . TONSILLECTOMY AND ADENOIDECTOMY     age 19      Allergies: Codeine  Medications: Prior to Admission medications   Medication Sig Start Date End Date Taking? Authorizing Provider  atorvastatin (LIPITOR) 20 MG tablet Take 1 tablet (20 mg total) by mouth daily. 11/25/17  Yes Denita Lung, MD  ibuprofen (ADVIL,MOTRIN) 600 MG tablet Take 1 tablet (600 mg total) by mouth every 6 (six) hours as needed. 06/25/15  Yes Varney Biles, MD  Multiple Vitamin (MULTIVITAMIN WITH MINERALS) TABS Take 1 tablet by mouth daily.   Yes [provider]  pantoprazole (PROTONIX) 40 MG tablet Take 1 tab before breakfast. 10/20/17  Yes Esterwood, Amy S, PA-C  PARoxetine (PAXIL) 20 MG tablet Take 1 tablet (20 mg total) by mouth daily. 11/25/17  Yes Denita Lung, MD  Na Sulfate-K Sulfate-Mg Sulf 17.5-3.13-1.6 GM/177ML SOLN Take as directed for colonoscopy. 10/20/17   Esterwood, Amy S, PA-C  zolmitriptan (ZOMIG) 5 MG tablet Take 1 tablet (5 mg total) by mouth as needed for migraine. 07/27/15    Denita Lung, MD     Vital Signs: Blood pressure 110/71, heart rate 61, temp 98.1, respirations 16, O2 sat 100% room air   Physical Exam awake, alert.  Chest clear to auscultation bilaterally.  Heart with regular rate and rhythm.  Abdomen soft, positive bowel sounds, nontender.  No significant lower extremity edema  Imaging: No results found.  Labs:  CBC: Recent Labs    09/22/17 1624 12/30/17 1121  WBC 6.9 6.4  HGB 13.6 14.3  HCT 40.9 43.3  PLT 220 196    COAGS: Recent Labs    10/20/17 1144 12/30/17 1121  INR 1.0 1.17  APTT  --  33    BMP: Recent Labs    09/22/17 1624  NA 141  K 4.4  CL 103  CO2 21  GLUCOSE 76  BUN 8  CALCIUM 9.1  CREATININE 0.70  GFRNONAA 94  GFRAA 108    LIVER FUNCTION TESTS: Recent Labs    09/22/17 1624 10/20/17 1144  BILITOT 0.5 0.7  AST 272* 78*  ALT 203* 56*  ALKPHOS 163* 104  PROT 7.3 8.4*  ALBUMIN 3.7 4.1    Assessment and Plan:  Pt with history of cirrhosis by imaging along with elevated LFTs; presents today for ultrasound-guided random liver biopsy for further evaluation. Risks and benefits discussed with the patient/daughter including, but not limited to bleeding, infection, damage to adjacent structures or low yield requiring additional tests.  All of the  patient's questions were answered, patient is agreeable to proceed. Consent signed and in chart.     Electronically Signed: D. Rowe Robert, PA-C 12/30/2017, 12:35 PM   I spent a total of 25 minutes at the the patient's bedside AND on the patient's hospital floor or unit, greater than 50% of which was counseling/coordinating care for image guided random core liver biopsy

## 2017-12-30 NOTE — Discharge Instructions (Signed)
Moderate Conscious Sedation, Adult, Care After These instructions provide you with information about caring for yourself after your procedure. Your health care provider may also give you more specific instructions. Your treatment has been planned according to current medical practices, but problems sometimes occur. Call your health care provider if you have any problems or questions after your procedure. What can I expect after the procedure? After your procedure, it is common:  To feel sleepy for several hours.  To feel clumsy and have poor balance for several hours.  To have poor judgment for several hours.  To vomit if you eat too soon.  Follow these instructions at home: For at least 24 hours after the procedure:   Do not: ? Participate in activities where you could fall or become injured. ? Drive. ? Use heavy machinery. ? Drink alcohol. ? Take sleeping pills or medicines that cause drowsiness. ? Make important decisions or sign legal documents. ? Take care of children on your own.  Rest. Eating and drinking  Follow the diet recommended by your health care provider.  If you vomit: ? Drink water, juice, or soup when you can drink without vomiting. ? Make sure you have little or no nausea before eating solid foods. General instructions  Have a responsible adult stay with you until you are awake and alert.  Take over-the-counter and prescription medicines only as told by your health care provider.  If you smoke, do not smoke without supervision.  Keep all follow-up visits as told by your health care provider. This is important. Contact a health care provider if:  You keep feeling nauseous or you keep vomiting.  You feel light-headed.  You develop a rash.  You have a fever. Get help right away if:  You have trouble breathing. This information is not intended to replace advice given to you by your health care provider. Make sure you discuss any questions you have  with your health care provider. Document Released: 03/31/2013 Document Revised: 11/13/2015 Document Reviewed: 09/30/2015 Elsevier Interactive Patient Education  2018 [employer].   Liver Biopsy, Care After These instructions give you information on caring for yourself after your procedure. Your doctor may also give you more specific instructions. Call your doctor if you have any problems or questions after your procedure. Follow these instructions at home:  Rest at home for 1-2 days or as told by your doctor.  Have someone stay with you for at least 24 hours.  Do not do these things in the first 24 hours: ? Drive. ? Use machinery. ? Take care of other people. ? Sign legal documents. ? Take a bath or shower.  You may shower tomorrow.  There are many different ways to close and cover a cut (incision). For example, a cut can be closed with stitches, skin glue, or adhesive strips. Follow your doctor's instructions on: ? Taking care of your cut. ? Changing and removing your bandage (dressing).  You may remove your dressing tomorrow. ? Removing whatever was used to close your cut.  Do not drink alcohol in the first week.  Do not lift more than 5 pounds or play contact sports for the first 2 weeks.  Take medicines only as told by your doctor. For 1 week, do not take medicine that has aspirin in it or medicines like ibuprofen.  Get your test results. Contact a doctor if:  A cut bleeds and leaves more than just a small spot of blood.  A cut is red, puffs  up (swells), or hurts more than before.  Fluid or something else comes from a cut.  A cut smells bad.  You have a fever or chills. Get help right away if:  You have swelling, bloating, or pain in your belly (abdomen).  You get dizzy or faint.  You have a rash.  You feel sick to your stomach (nauseous) or throw up (vomit).  You have trouble breathing, feel short of breath, or feel faint.  Your chest hurts.  You have  problems talking or seeing.  You have trouble balancing or moving your arms or legs. This information is not intended to replace advice given to you by your health care provider. Make sure you discuss any questions you have with your health care provider. Document Released: 03/19/2008 Document Revised: 11/16/2015 Document Reviewed: 08/06/2013 Elsevier Interactive Patient Education  [employer].

## 2018-01-05 ENCOUNTER — Telehealth: Payer: Self-pay | Admitting: Gastroenterology

## 2018-01-05 NOTE — Telephone Encounter (Signed)
The pt aware that Dr Loletha Carrow has not reviewed the pathology from liver biopsy and we will call as soon as resulted and reviewed

## 2018-01-05 NOTE — Telephone Encounter (Signed)
Patient states [facility]Danis on 7.9.19. Patient wanting to know if the results are back.

## 2018-01-13 ENCOUNTER — Telehealth: Payer: Self-pay | Admitting: Gastroenterology

## 2018-01-13 NOTE — Telephone Encounter (Signed)
Faxed referral request and information to [facility] ([place]) at [phone]. Will put referral information in [place].

## 2018-01-13 NOTE — Telephone Encounter (Signed)
Elizabeth Phelps,    Last evening I spoke with Elizabeth Phelps about her liver biopsy results. 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  Unusual condition, so I would like the opinion of a hepatologist and expert in this.  I spoke to Dr. Bjorn Pippin with [facility] in [place] who has a Radiation protection practitioner satellite clinic with Elizabeth Locks, NP.  Elizabeth Phelps is agreeable to seeing Dr. Precious Gilding office in consultation.  Please send the referral and records: [facility] and My office notes, LFTs since May 2018, all Lab results from [facility] and My office visits, the liver biopsy report and CT/US reports. After the hepatology clinic sees Elizabeth Phelps, they will request the liver biopsy slides from our pathology group to review themselves.

## 2018-01-22 ENCOUNTER — Other Ambulatory Visit: Payer: Self-pay | Admitting: Family Medicine

## 2018-01-22 DIAGNOSIS — F341 Dysthymic disorder: Secondary | ICD-10-CM

## 2018-01-22 NOTE — Telephone Encounter (Signed)
[facility] is requesting to fill pt paxil. Please advise. [place]

## 2018-01-27 DIAGNOSIS — K743 Primary biliary cirrhosis: Secondary | ICD-10-CM | POA: Diagnosis not present

## 2018-01-27 DIAGNOSIS — K7469 Other cirrhosis of liver: Secondary | ICD-10-CM | POA: Diagnosis not present

## 2018-02-03 DIAGNOSIS — K759 Inflammatory liver disease, unspecified: Secondary | ICD-10-CM | POA: Diagnosis not present

## 2018-02-24 ENCOUNTER — Telehealth: Payer: Self-pay | Admitting: Gastroenterology

## 2018-02-24 ENCOUNTER — Other Ambulatory Visit: Payer: Self-pay

## 2018-02-24 NOTE — Telephone Encounter (Signed)
I do not know, as I had not seen any office note from that clinic as of 8/29 when I was last in office.  Perhaps there is a hard copy of a faxed office note in my office.  I am not in the office this week while covering hospital consult service.  Please check my office and, if no consult note, please obtain it and let me know.

## 2018-02-24 NOTE — Telephone Encounter (Signed)
Patient said she had met with Roosevelt Locks, NP and results were sent here along with recommendations. She was not sure what medication it is that we were to send in. Please advise, thank you.

## 2018-02-25 MED ORDER — URSODIOL 500 MG PO TABS: 750.0000 mg | ORAL_TABLET | Freq: Every day | ORAL | 3 refills | Status: DC

## 2018-02-25 NOTE — Addendum Note (Signed)
Addended by: Nelida Meuse on: 02/25/2018 05:25 PM   Modules accepted: Orders

## 2018-02-25 NOTE — Telephone Encounter (Signed)
There is a copy of the consult note scanned into Epic under Media tab from 01/27/18, I have printed off a hard copy and put it on your desk.

## 2018-02-25 NOTE — Telephone Encounter (Signed)
After reviewing the August 6 clinic note, I spoke with Roosevelt Locks, NP today, who evaluated this agent along with Dr. Genice Rouge.  Further lab results and the formal review of her biopsy slides by the [facility] pathology department will be forwarded to me.  Please keep an eye out for those.  (Please try to determine from our front desk staff and/or office manager how this patient's consultation note from a month ago was scanned into the chart without coming across my desk for review.)  They feel this patient has PBC doubt autoimmune hepatitis, and the recommended medication is ursodiol.  Based on Donatella's most recent weight in the usual dosing, the prescription is as follows:  Ursodiol (Urso Forte) 500 mg tablet (char scored and can be broken in half) Sig: one tablet by mouth every morning and one half tablet every evening. Take with food.  I have sent the prescription.  Please arrange a lab draw for hepatic function panel in a month and an office visit with me in about 6 weeks.

## 2018-02-26 ENCOUNTER — Other Ambulatory Visit: Payer: Self-pay

## 2018-02-26 DIAGNOSIS — K7460 Unspecified cirrhosis of liver: Secondary | ICD-10-CM

## 2018-03-04 ENCOUNTER — Telehealth: Payer: Self-pay | Admitting: Gastroenterology

## 2018-03-04 NOTE — Telephone Encounter (Signed)
Sent patient a message with Dr. Loletha Carrow' recommendations.

## 2018-03-04 NOTE — Telephone Encounter (Signed)
Patient tried the ursodiol, it is causing her a lot of diarrhea side effects. She is unable to take this especially while she is at work. Instructed to not take it until she hears back from our office.  Please advise, thank you.

## 2018-03-04 NOTE — Telephone Encounter (Signed)
I am afraid that is a common side effect, so we will try a lower dose, see if that is tolerated, and maybe be able to increase dose slowly later on .  Try one half of the the 500 mg tablet once each morning, and another half tablet in the evening (with meals).  The I would like to hear from her next week with how that is going.

## 2018-04-02 ENCOUNTER — Other Ambulatory Visit (INDEPENDENT_AMBULATORY_CARE_PROVIDER_SITE_OTHER): Payer: BLUE CROSS/BLUE SHIELD

## 2018-04-02 DIAGNOSIS — K7460 Unspecified cirrhosis of liver: Secondary | ICD-10-CM | POA: Diagnosis not present

## 2018-04-02 LAB — HEPATIC FUNCTION PANEL
ALT: 86 U/L — AB (ref 0–35)
AST: 128 U/L — AB (ref 0–37)
Albumin: 3.4 g/dL — ABNORMAL LOW (ref 3.5–5.2)
Alkaline Phosphatase: 121 U/L — ABNORMAL HIGH (ref 39–117)
BILIRUBIN DIRECT: 0.2 mg/dL (ref 0.0–0.3)
BILIRUBIN TOTAL: 0.7 mg/dL (ref 0.2–1.2)
Total Protein: 7.6 g/dL (ref 6.0–8.3)

## 2018-04-07 ENCOUNTER — Ambulatory Visit: Payer: BLUE CROSS/BLUE SHIELD | Admitting: Gastroenterology

## 2018-04-10 ENCOUNTER — Other Ambulatory Visit: Payer: Self-pay | Admitting: Family Medicine

## 2018-04-10 DIAGNOSIS — E785 Hyperlipidemia, unspecified: Secondary | ICD-10-CM

## 2018-05-01 ENCOUNTER — Ambulatory Visit: Payer: BLUE CROSS/BLUE SHIELD | Admitting: Gastroenterology

## 2018-06-11 ENCOUNTER — Encounter: Payer: Self-pay | Admitting: Gastroenterology

## 2018-06-11 ENCOUNTER — Ambulatory Visit: Payer: BLUE CROSS/BLUE SHIELD | Admitting: Gastroenterology

## 2018-06-11 ENCOUNTER — Other Ambulatory Visit (INDEPENDENT_AMBULATORY_CARE_PROVIDER_SITE_OTHER): Payer: BLUE CROSS/BLUE SHIELD

## 2018-06-11 VITALS — BP 110/74 | HR 64 | Ht 63.0 in | Wt 119.0 lb

## 2018-06-11 DIAGNOSIS — R1013 Epigastric pain: Secondary | ICD-10-CM

## 2018-06-11 DIAGNOSIS — R748 Abnormal levels of other serum enzymes: Secondary | ICD-10-CM

## 2018-06-11 DIAGNOSIS — K743 Primary biliary cirrhosis: Secondary | ICD-10-CM

## 2018-06-11 DIAGNOSIS — K7460 Unspecified cirrhosis of liver: Secondary | ICD-10-CM

## 2018-06-11 DIAGNOSIS — Z23 Encounter for immunization: Secondary | ICD-10-CM | POA: Diagnosis not present

## 2018-06-11 LAB — CBC WITH DIFFERENTIAL/PLATELET
Basophils Absolute: 0.1 10*3/uL (ref 0.0–0.1)
Basophils Relative: 1.2 % (ref 0.0–3.0)
Eosinophils Absolute: 0.1 10*3/uL (ref 0.0–0.7)
Eosinophils Relative: 1.4 % (ref 0.0–5.0)
HCT: 44.6 % (ref 36.0–46.0)
Hemoglobin: 14.9 g/dL (ref 12.0–15.0)
Lymphocytes Relative: 36.3 % (ref 12.0–46.0)
Lymphs Abs: 2.5 10*3/uL (ref 0.7–4.0)
MCHC: 33.3 g/dL (ref 30.0–36.0)
MCV: 85.5 fl (ref 78.0–100.0)
Monocytes Absolute: 0.7 10*3/uL (ref 0.1–1.0)
Monocytes Relative: 9.5 % (ref 3.0–12.0)
NEUTROS PCT: 51.6 % (ref 43.0–77.0)
Neutro Abs: 3.5 10*3/uL (ref 1.4–7.7)
Platelets: 204 10*3/uL (ref 150.0–400.0)
RBC: 5.21 Mil/uL — AB (ref 3.87–5.11)
RDW: 13.4 % (ref 11.5–15.5)
WBC: 6.9 10*3/uL (ref 4.0–10.5)

## 2018-06-11 LAB — COMPREHENSIVE METABOLIC PANEL
ALK PHOS: 96 U/L (ref 39–117)
ALT: 36 U/L — ABNORMAL HIGH (ref 0–35)
AST: 61 U/L — ABNORMAL HIGH (ref 0–37)
Albumin: 3.6 g/dL (ref 3.5–5.2)
BUN: 8 mg/dL (ref 6–23)
CO2: 26 mEq/L (ref 19–32)
Calcium: 9.4 mg/dL (ref 8.4–10.5)
Chloride: 107 mEq/L (ref 96–112)
Creatinine, Ser: 0.56 mg/dL (ref 0.40–1.20)
GFR: 116.38 mL/min (ref >60.00)
GLUCOSE: 94 mg/dL (ref 70–99)
Potassium: 4.3 mEq/L (ref 3.5–5.1)
Sodium: 140 mEq/L (ref 135–145)
Total Bilirubin: 0.6 mg/dL (ref 0.2–1.2)
Total Protein: 7.2 g/dL (ref 6.0–8.3)

## 2018-06-11 NOTE — Progress Notes (Signed)
[facility] Progress Note  Chief Complaint: Cirrhosis from [place]  Subjective  History:  [name] follows up for her [place]. Since I last saw Elizabeth Phelps 21, she had a liver biopsy that favored a diagnosis of PBC rather than autoimmune hepatitis.  I contacted her with those results and recommended evaluation by the [facility] hematology clinic.  They were kind enough to do a thorough consultation and felt that this was more PBC then AIH/overlap syndrome.  Ursodiol was recommended, which I prescribed at the usual dose of 13 to 15 mg/kg divided twice daily.  She was unable to tolerate this due to diarrhea and profound fatigue.  I last heard from her about 3 months ago, at which time I recommended cutting the dose by half and then contacting us in a couple weeks with an update.  We have not heard from her since then. Also spoke with the hepatology clinic afterwards to see if they might advocate changing treatment to obeticholic acid.  Tinisha continues to struggle with the Actigall after she last contacted [place], but she did not want to trouble me about it.  She kept taking it until about a month ago, when she had to stop due to the side effects.  It was affecting her appetite, giving her diarrhea, and making her lose weight.  She feels her symptoms are so bad it caused her to lose her job at that time.  She has since gotten other employment, and has been off the Actigall for a month.  Her weight has stabilized and appetite improved.  She has poor energy and sometimes insomnia.  ROS: Cardiovascular:  no chest pain Respiratory: no dyspnea Arthralgias Headaches Remainder of systems negative except as above The patient's Past Medical, Family and Social History were reviewed and are on file in the EMR.  Objective:  Med list reviewed  Current Outpatient Medications:  .  atorvastatin (LIPITOR) 20 MG tablet, TAKE 1 TABLET BY MOUTH ONCE DAILY., Disp: 90 tablet, Rfl: 1 .  ibuprofen (ADVIL,MOTRIN)  600 MG tablet, Take 1 tablet (600 mg total) by mouth every 6 (six) hours as needed., Disp: 30 tablet, Rfl: 0 .  Multiple Vitamin (MULTIVITAMIN WITH MINERALS) TABS, Take 1 tablet by mouth daily., Disp: , Rfl:  .  pantoprazole (PROTONIX) 40 MG tablet, Take 1 tab before breakfast., Disp: 30 tablet, Rfl: 6 .  PARoxetine (PAXIL) 20 MG tablet, TAKE 1 TABLET BY MOUTH ONCE DAILY., Disp: 90 tablet, Rfl: 1 .  zolmitriptan (ZOMIG) 5 MG tablet, Take 1 tablet (5 mg total) by mouth as needed for migraine., Disp: 10 tablet, Rfl: 5 .  ursodiol (ACTIGALL) 500 MG tablet, Take 1.5 tablets (750 mg total) by mouth daily. Take one tablet every morning and one half tablet every evening.  Take with food. (Patient not taking: Reported on 06/11/2018), Disp: 45 tablet, Rfl: 3  Current Facility-Administered Medications:  .  0.9 %  sodium chloride infusion, 500 mL, Intravenous, Once, Danis, Estill Cotta III, MD   Vital signs in last 24 hrs: Vitals:   06/11/18 1129  BP: 110/74  Pulse: 64  119 pounds  Physical Exam  She has noticeably lost weight since I last saw her.  HEENT: sclera anicteric, oral mucosa moist without lesions  Neck: supple, no thyromegaly, JVD or lymphadenopathy  Cardiac: RRR without murmurs, S1S2 heard, no peripheral edema  Pulm: clear to auscultation bilaterally, normal RR and effort noted  Abdomen: soft, epigastric and right lower chest wall tenderness as before,  with active bowel sounds. No guarding or palpable hepatosplenomegaly.  No distention or bulging flanks   skin; warm and dry, no jaundice or rash. + Palmar erythema   Labs: Hepatitis B surface antibody negative  On 04/02/2018, alkaline phosphatase 121, albumin 3.4, AST 128, ALT 86, total bilirubin 0.2  Liver biopsy showed bile duct loss and inflammation with lymphocyte and plasma cell infiltrate. Liver biopsy was then reviewed by the [facility] pathologist, similar conclusion.  They felt that it was insistent with  PBC.  @ASSESSMENTPLANBEGIN @ Assessment: Encounter Diagnoses  Name Primary?  . Primary biliary cholangitis ([place]) Yes  . Cirrhosis of liver without ascites, unspecified hepatic cirrhosis type ([place])   . Abnormal transaminases   . Abdominal pain, epigastric    Elizabeth Phelps could not tolerate Actigall side effects, and I am glad she stopped it.  I had hoped to hear from her sooner than this, but I am glad she is doing well at this point.  She still needs long-term management if there is a tolerable medicine.  She understands this will reduce her chance of liver disease decompensation, liver failure, need for transplant and liver cancer.  Advised her to get a flu shot, but she says the last time she did it several years ago she was sick in bed for a week and so she declines.  She does need hepatitis B vaccine series, was agreeable, so first injection given today.   Plan: Labs today for LFTs, CBC, PT/INR, alpha-fetoprotein Right upper quadrant ultrasound soon to screen for hepatoma  I have lab results, I will communicate with the [facility] hepatology clinic and ask them to manage the medicine for this PBC.  The only other approved medicine is obetocholic acid, a specialty medicine that I do not yet have experience using.  I would like there consultation and management of this, at least to start.  Further plan to follow.  Total time 35 minutes, over half spent face-to-face with patient in counseling and coordination of care.   Nelida Meuse III

## 2018-06-11 NOTE — Patient Instructions (Signed)
If you are age 62 or older, your body mass index should be between 23-30. Your Body mass index is 21.08 kg/m. If this is out of the aforementioned range listed, please consider follow up with your Primary Care Provider.  If you are age 32 or younger, your body mass index should be between 19-25. Your Body mass index is 21.08 kg/m. If this is out of the aformentioned range listed, please consider follow up with your Primary Care Provider.    You have been scheduled for an abdominal ultrasound at [facility] (1st floor of hospital) on 06-15-2018 at 930am. Please arrive 15 minutes prior to your appointment for registration. Make certain not to have anything to eat or drink 6 hours prior to your appointment. Should you need to reschedule your appointment, please contact radiology at [phone]. This test typically takes about 30 minutes to perform.  Your provider has requested that you go to the basement level for lab work before leaving today. Press "B" on the elevator. The lab is located at the first door on the left as you exit the elevator.  We have you the Hep B shot today. Please scheduled the 2nd one in a month and a day.   It was a pleasure to see you today!  Dr. Loletha Carrow

## 2018-06-12 LAB — AFP TUMOR MARKER: AFP-Tumor Marker: 4.2 ng/mL

## 2018-06-15 ENCOUNTER — Ambulatory Visit (HOSPITAL_COMMUNITY)
Admission: RE | Admit: 2018-06-15 | Discharge: 2018-06-15 | Disposition: A | Discharge status: Home / Self Care | Payer: BLUE CROSS/BLUE SHIELD | Source: Ambulatory Visit | Attending: Gastroenterology | Admitting: Gastroenterology

## 2018-06-15 DIAGNOSIS — R748 Abnormal levels of other serum enzymes: Secondary | ICD-10-CM | POA: Diagnosis not present

## 2018-06-15 DIAGNOSIS — K743 Primary biliary cirrhosis: Secondary | ICD-10-CM | POA: Insufficient documentation

## 2018-06-15 DIAGNOSIS — R1013 Epigastric pain: Secondary | ICD-10-CM | POA: Diagnosis not present

## 2018-06-15 DIAGNOSIS — K7460 Unspecified cirrhosis of liver: Secondary | ICD-10-CM | POA: Insufficient documentation

## 2018-07-14 ENCOUNTER — Ambulatory Visit (INDEPENDENT_AMBULATORY_CARE_PROVIDER_SITE_OTHER): Payer: BLUE CROSS/BLUE SHIELD | Admitting: Gastroenterology

## 2018-07-14 DIAGNOSIS — K7469 Other cirrhosis of liver: Secondary | ICD-10-CM

## 2018-07-14 DIAGNOSIS — Z23 Encounter for immunization: Secondary | ICD-10-CM | POA: Diagnosis not present

## 2018-07-24 DIAGNOSIS — R509 Fever, unspecified: Secondary | ICD-10-CM | POA: Diagnosis not present

## 2018-07-24 DIAGNOSIS — J111 Influenza due to unidentified influenza virus with other respiratory manifestations: Secondary | ICD-10-CM | POA: Diagnosis not present

## 2018-08-04 ENCOUNTER — Ambulatory Visit (INDEPENDENT_AMBULATORY_CARE_PROVIDER_SITE_OTHER): Payer: Medicaid Other | Admitting: Family Medicine

## 2018-08-04 ENCOUNTER — Encounter: Payer: Self-pay | Admitting: Family Medicine

## 2018-08-04 VITALS — BP 116/76 | HR 87 | Temp 98.3°F | Wt 115.8 lb

## 2018-08-04 DIAGNOSIS — F341 Dysthymic disorder: Secondary | ICD-10-CM | POA: Diagnosis not present

## 2018-08-04 DIAGNOSIS — K047 Periapical abscess without sinus: Secondary | ICD-10-CM | POA: Diagnosis not present

## 2018-08-04 DIAGNOSIS — R413 Other amnesia: Secondary | ICD-10-CM | POA: Diagnosis not present

## 2018-08-04 DIAGNOSIS — R292 Abnormal reflex: Secondary | ICD-10-CM

## 2018-08-04 DIAGNOSIS — R519 Headache, unspecified: Secondary | ICD-10-CM

## 2018-08-04 DIAGNOSIS — K743 Primary biliary cirrhosis: Secondary | ICD-10-CM

## 2018-08-04 DIAGNOSIS — K754 Autoimmune hepatitis: Secondary | ICD-10-CM | POA: Insufficient documentation

## 2018-08-04 DIAGNOSIS — G935 Compression of brain: Secondary | ICD-10-CM

## 2018-08-04 DIAGNOSIS — K7469 Other cirrhosis of liver: Secondary | ICD-10-CM | POA: Diagnosis not present

## 2018-08-04 DIAGNOSIS — R51 Headache: Secondary | ICD-10-CM

## 2018-08-04 HISTORY — DX: Autoimmune hepatitis: K75.4

## 2018-08-04 HISTORY — DX: Primary biliary cirrhosis: K74.3

## 2018-08-04 MED ORDER — AMOXICILLIN 875 MG PO TABS: 875.0000 mg | ORAL_TABLET | Freq: Two times a day (BID) | ORAL | 0 refills | Status: DC

## 2018-08-04 NOTE — Progress Notes (Signed)
   Subjective:    Patient ID: Elizabeth Phelps, female    DOB: December 12, 1955, 63 y.o.   MRN: [id number]  HPI She is here for consult concerning memory loss.  She states that since October she has noted difficulty with her memory.  She states that she can get in a car to get where she is going.  She states she has to use her GPS for that purpose.  She also complains of a dull headache as well as dizziness but no blurred vision, double vision,, weakness, falls..  She does state that she is tends to fall to the right.  She also complains of left leg pain.  She does have a previous history of Arnold-Chiari malformation and apparently a fusiform aneurysm.  She is being treated for primary biliary cholangitis.  She also has a history of dysthymia and presently is on Paxil for that.  She does have a previous history of migraines and does use Zomig but states this headache is different than a migraine.   Review of Systems     Objective:   Physical Exam Alert and in no distress.  Left second lower molar does show decay.  Tender over the left jawline.  Tympanic membranes and canals are normal. Pharyngeal area is normal. Neck is supple without adenopathy or thyromegaly. Cardiac exam shows a regular sinus rhythm without murmurs or gallops. Lungs are clear to auscultation.   EOMI.  Other cranial nerves grossly intact.  Cerebellar testing negative.  DTRs were 3-4+ bilaterally.  2 beat clonus noted. MMSE 28      Assessment & Plan:  Memory loss - Plan: RPR, Vitamin B12, Folate  Hyperreflexia - Plan: RPR, Vitamin B12, Folate, TSH  Tooth abscess - Plan: amoxicillin (AMOXIL) 875 MG tablet  Dysthymia  Arnold-Chiari malformation, type I (HCC)  Primary biliary cholangitis (HCC)  Nonintractable headache, unspecified chronicity pattern, unspecified headache type

## 2018-08-05 ENCOUNTER — Other Ambulatory Visit: Payer: Self-pay

## 2018-08-05 DIAGNOSIS — R413 Other amnesia: Secondary | ICD-10-CM

## 2018-08-05 DIAGNOSIS — R292 Abnormal reflex: Secondary | ICD-10-CM

## 2018-08-05 LAB — SYPHILIS: RPR W/REFLEX TO RPR TITER AND TREPONEMAL ANTIBODIES, TRADITIONAL SCREENING AND DIAGNOSIS ALGORITHM: RPR Ser Ql: NONREACTIVE

## 2018-08-05 LAB — VITAMIN B12: Vitamin B-12: 1007 pg/mL (ref 232–1245)

## 2018-08-05 LAB — TSH: TSH: 1.66 u[IU]/mL (ref 0.450–4.500)

## 2018-08-05 LAB — FOLATE: Folate: 17.3 ng/mL (ref >3.0)

## 2018-08-06 ENCOUNTER — Other Ambulatory Visit: Payer: Self-pay

## 2018-08-06 DIAGNOSIS — R413 Other amnesia: Secondary | ICD-10-CM

## 2018-08-06 DIAGNOSIS — R292 Abnormal reflex: Secondary | ICD-10-CM

## 2018-08-07 ENCOUNTER — Encounter: Payer: Self-pay | Admitting: Neurology

## 2018-08-10 ENCOUNTER — Telehealth: Payer: Self-pay | Admitting: Family Medicine

## 2018-08-10 NOTE — Telephone Encounter (Signed)
Pt called and would like to get in sooner to see neuro states she does not want to wait until may, she would like you to call and get her in sooner, pt can be reached at [phone] and would like a call back after the appt is made,

## 2018-08-10 NOTE — Telephone Encounter (Signed)
Left message on voicemail for patient to call back. 

## 2018-08-12 ENCOUNTER — Telehealth: Payer: Self-pay | Admitting: Neurology

## 2018-08-12 NOTE — Telephone Encounter (Signed)
Pt was advise of being put on a cancellation list and a message will be sent back to provider to see if she can get in sooner than 10-23-18. [place]

## 2018-08-12 NOTE — Telephone Encounter (Signed)
Ok to give her a 1pm work-in slot this or next month, thanks

## 2018-08-12 NOTE — Telephone Encounter (Signed)
Patient's PCP is wanting her seen sooner. Is there anywhere that we can work her in and have permission for? Please let us know up front and we will call if need to change schedule. Thanks Meg!

## 2018-08-12 NOTE — Telephone Encounter (Signed)
It looks like there is an open NP slot on March 17 @ 2PM.  Other than that, the only thing I can think of is if a 1PM slot doesn't get taken one day, we can offer her that time.  Only problem would be is that we hold those for urgent pt's, so we wouldn't know if available until the end-of-day, the day before.    PS: I looked in her chart, and it does not appear that this is urgent.  Pt called her PCP after May appointment was made and requested that PCP's office contact us.    Forwarding to Dr. Delice Lesch as Juluis Rainier

## 2018-08-18 ENCOUNTER — Other Ambulatory Visit: Payer: BLUE CROSS/BLUE SHIELD

## 2018-09-08 ENCOUNTER — Other Ambulatory Visit: Payer: Self-pay

## 2018-09-08 ENCOUNTER — Encounter

## 2018-09-08 ENCOUNTER — Encounter: Payer: Self-pay | Admitting: Neurology

## 2018-09-08 ENCOUNTER — Ambulatory Visit (INDEPENDENT_AMBULATORY_CARE_PROVIDER_SITE_OTHER): Payer: Medicaid Other | Admitting: Neurology

## 2018-09-08 VITALS — BP 108/70 | HR 94 | Temp 98.0°F | Ht 63.0 in | Wt 119.2 lb

## 2018-09-08 DIAGNOSIS — Z9889 Other specified postprocedural states: Secondary | ICD-10-CM

## 2018-09-08 DIAGNOSIS — R292 Abnormal reflex: Secondary | ICD-10-CM

## 2018-09-08 DIAGNOSIS — R413 Other amnesia: Secondary | ICD-10-CM

## 2018-09-08 DIAGNOSIS — M542 Cervicalgia: Secondary | ICD-10-CM | POA: Diagnosis not present

## 2018-09-08 DIAGNOSIS — Z8679 Personal history of other diseases of the circulatory system: Secondary | ICD-10-CM

## 2018-09-08 DIAGNOSIS — G3184 Mild cognitive impairment, so stated: Secondary | ICD-10-CM

## 2018-09-08 NOTE — Progress Notes (Signed)
NEUROLOGY CONSULTATION NOTE  Elizabeth Phelps MRN: [id number] DOB: 05/12/1956  Referring provider: Dr. Jill Alexanders Primary care provider: Dr. Jill Alexanders  Reason for consult:  Memory loss  Dear Dr Redmond School:  Thank you for your kind referral of Elizabeth Phelps for consultation of the above symptoms. Although her history is well known to you, please allow me to reiterate it for the purpose of our medical record. The patient was accompanied to the clinic by her daughter-in-law [name] who also provides collateral information. Records and images were personally reviewed where available.  HISTORY OF PRESENT ILLNESS: This is a pleasant 63 year old right-handed woman with a history of hyperlipidemia, tobacco use, anxiety, migraine with aura, right MCA aneurysm s/p craniotomy, presenting for evaluation of worsening memory. She started having memory issues after her brain surgery in 2002, however she continued to function quite well and states she has always been on top of things until around October 2019. At that time, she started noticing word-finding difficulties, [name] has noticed this as well, she would stop mid-sentence unable to remember a word. She would hear something but would not comprehend what people are saying, asking them to repeat it several times. She feels like her brain is "short-circuiting." She reports her long-term memory is "awesome" but she cannot remember what she ate yesterday. She has gotten lost driving and uses her GPS all the time even for familiar roads. 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 There were 3 days last week where she was driving to work then she suddenly realizes she is there but does not remember the trip. [name] was talking to her one time while patient was driving then realized she was at work, and did not notice any confusion. 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 She states numbers have always been her thing but now she cannot do math anymore. She lives with 2 roommates who were the first ones to notice how forgetful she has been. She has been told she is "off somewhere" or "zoning out" and would need to be called a couple of times to get her attention. She has noticed she gets frustrated and agitated more easily. She was anxious after her brain surgery in 2002 and was started on Paxil which helped, she does not feel it is helping much now.  She has also started having involuntary left hand shaking lasting a few minutes. Leg is unaffected. There is no associated speech difficulty, confusion, or weakness when she has the shaking. She has numbness and tingling in her finger tips. She has been having more headaches since October, with good response to Excedrin. She has dizziness when she gets up from bed in the morning or in the middle of the night, this is when she usually falls. Last fall was a week ago. She has noticed that if she looks at something sometimes, she sees something different, then looks again and sees what the object actually is. No olfactory/gustatory hallucinations. She has nausea when dizzy, but also has waves of nausea throughout the day. Sleep is fine most of the time. She has not woken up with tongue bite or incontinence. Her mother had memory issues after several strokes. Her father had a history of  cerebral aneurysm as well. She had a head injury in the 1980s. No alcohol use. There is no family history of seizures.   Her last brain imaging was done in January 2017. I personally reviewed images, MRI brain without contrast showed encephalomalacia of the right temporal lobe, right lateral lenticulostriate territory chronic infarct affecting the external capsule, periventricular white matter, patulous right temporal  horn. Cerebellar tonsils are 70mm below foramen magnum. MRA head showed combination fusiform and saccular aneurysm of the right MCA throughout M1 segment, appearing similar in size to angiogram in 2002.   PAST MEDICAL HISTORY: Past Medical History:  Diagnosis Date  . Anxiety   . Arnold-Chiari malformation, type I ([place])   . Brain aneurysm   . Migraine headache   . Personal history of colonic polyps 11/07  . Smoker     PAST SURGICAL HISTORY: Past Surgical History:  Procedure Laterality Date  . APPENDECTOMY    . BRAIN SURGERY    . CATARACT EXTRACTION Right 2018  . COLONOSCOPY  2007   Dr. Collene Mares  . KNEE ARTHROSCOPY Right   . TONSILLECTOMY AND ADENOIDECTOMY     age 46    MEDICATIONS: Current Outpatient Medications on File Prior to Visit  Medication Sig Dispense Refill  . atorvastatin (LIPITOR) 20 MG tablet TAKE 1 TABLET BY MOUTH ONCE DAILY. 90 tablet 1  . ibuprofen (ADVIL,MOTRIN) 600 MG tablet Take 1 tablet (600 mg total) by mouth every 6 (six) hours as needed. 30 tablet 0  . Multiple Vitamin (MULTIVITAMIN WITH MINERALS) TABS Take 1 tablet by mouth daily.    . pantoprazole (PROTONIX) 40 MG tablet Take 1 tab before breakfast. 30 tablet 6  . PARoxetine (PAXIL) 20 MG tablet TAKE 1 TABLET BY MOUTH ONCE DAILY. 90 tablet 1  . ursodiol (ACTIGALL) 500 MG tablet Take 1.5 tablets (750 mg total) by mouth daily. Take one tablet every morning and one half tablet every evening.  Take with food. 45 tablet 3  . zolmitriptan (ZOMIG) 5 MG tablet Take 1 tablet (5 mg total) by mouth as needed for migraine. 10 tablet 5   Current Facility-Administered Medications on File Prior to Visit  Medication Dose Route Frequency Provider Last Rate Last Dose  . 0.9 %  sodium chloride infusion  500 mL Intravenous Once Doran Stabler, MD        ALLERGIES: Allergies  Allergen Reactions  . Codeine Nausea And Vomiting    FAMILY HISTORY: Family History  Problem Relation Age of Onset  . Stroke Mother   .  Hypertension Mother   . Prostate cancer Father        mets  . Breast cancer Maternal Grandmother     SOCIAL HISTORY: Social History   Socioeconomic History  . Marital status: Divorced    Spouse name: Not on file  . Number of children: 2  . Years of education: Not on file  . Highest education level: Not on file  Occupational History  . Occupation: Therapist, art  Social Needs  . Financial resource strain: Not on file  . Food insecurity:    Worry: Not on file    Inability: Not on file  . Transportation needs:    Medical: Not on file    Non-medical: Not on file  Tobacco Use  . Smoking status: Current Every Day Smoker  . Smokeless tobacco: Never Used  Substance and Sexual Activity  . Alcohol use: No  . Drug use: No  . Sexual activity: Not Currently  Lifestyle  .  Physical activity:    Days per week: Not on file    Minutes per session: Not on file  . Stress: Not on file  Relationships  . Social connections:    Talks on phone: Not on file    Gets together: Not on file    Attends religious service: Not on file    Active member of club or organization: Not on file    Attends meetings of clubs or organizations: Not on file    Relationship status: Not on file  . Intimate partner violence:    Fear of current or ex partner: Not on file    Emotionally abused: Not on file    Physically abused: Not on file    Forced sexual activity: Not on file  Other Topics Concern  . Not on file  Social History Narrative  . Not on file    REVIEW OF SYSTEMS: Constitutional: No fevers, chills, or sweats, no generalized fatigue, change in appetite Eyes: No visual changes, double vision, eye pain Ear, nose and throat: No hearing loss, ear pain, nasal congestion, sore throat Cardiovascular: No chest pain, palpitations Respiratory:  No shortness of breath at rest or with exertion, wheezes GastrointestinaI: No nausea, vomiting, diarrhea, abdominal pain, fecal incontinence Genitourinary:   No dysuria, urinary retention or frequency Musculoskeletal:  No neck pain, back pain Integumentary: No rash, pruritus, skin lesions Neurological: as above Psychiatric: No depression, insomnia, anxiety Endocrine: No palpitations, fatigue, diaphoresis, mood swings, change in appetite, change in weight, increased thirst Hematologic/Lymphatic:  No anemia, purpura, petechiae. Allergic/Immunologic: no itchy/runny eyes, nasal congestion, recent allergic reactions, rashes  PHYSICAL EXAM: Vitals:   09/08/18 1303  BP: 108/70  Pulse: 94  Temp: 98 F (36.7 C)  SpO2: 94%   General: No acute distress Head:  Normocephalic/atraumatic Neck: supple, no paraspinal tenderness, full range of motion Back: No paraspinal tenderness Heart: regular rate and rhythm Lungs: Clear to auscultation bilaterally. Vascular: No carotid bruits. Skin/Extremities: No rash, no edema Neurological Exam: Mental status: alert and oriented to person, place, and time, no dysarthria or aphasia, Fund of knowledge is appropriate.  Remote memory impaired.  Attention and concentration are normal.    Able to name objects. Difficulty with repetition, decreased fluency. Montreal Cognitive Assessment  09/08/2018  Visuospatial/ Executive (0/5) 3  Naming (0/3) 3  Attention: Read list of digits (0/2) 2  Attention: Read list of letters (0/1) 0  Attention: Serial 7 subtraction starting at 100 (0/3) 3  Language: Repeat phrase (0/2) 0  Language : Fluency (0/1) 0  Abstraction (0/2) 1  Delayed Recall (0/5) 3  Orientation (0/6) 6  Total 21  Adjusted Score (based on education) 22   Cranial nerves: CN I: not tested CN II: pupils equal, round and reactive to light, left homonymous hemianopia CN III, IV, VI:  full range of motion, no nystagmus, no ptosis CN V: decreased pin on left V2 CN VII: upper and lower face symmetric CN VIII: hearing intact to finger rub CN IX, X: gag intact, uvula midline CN XI: sternocleidomastoid and trapezius  muscles intact CN XII: tongue midline Bulk & Tone: normal, no fasciculations. Motor: 5/5 throughout with no pronator drift. Sensation: patchy sensory changes with cold, decreased pin on left UE and LE, decreased vibration to ankles bilaterally. Romberg test slight sway Deep Tendon Reflexes: brisk +3 throughout, +bilateral Hoffman sign, no ankle clonus Plantar responses: downgoing bilaterally Cerebellar: no incoordination on finger to nose, heel to shin. No dysdiadochokinesia Gait: she felt dizzy upon standing,  but gait was narrow-based and steady, able to tandem walk adequately. Tremor: none  IMPRESSION: This is a 63 year old right-handed woman with a history of  hyperlipidemia, tobacco use, anxiety, migraine with aura, right MCA aneurysm s/p craniotomy, presenting for evaluation of worsening memory.  Her neurological exam shows a left hemianopia, decreased sensation on left, which may be chronic from prior brain surgery, there is also note of hyperreflexia throughout. MOCA score 22/30, indicating Mild Cognitive Impairment. Her last MRI brain in 2017 showed right temporal encephalomalacia, Chiari I malformation, and right MCA aneurysm 9x9x18mm. Etiology of memory changes unclear, TSH and B12 normal. She is having more headaches, as well as reporting possible gaps in time. Seizures is a consideration, the right temporal encephalomalacia is a risk factor. MRI brain with and without contrast and MRA head without contrast will be ordered to assess for underlying structural abnormality. MRI cervical spine with and without contrast will be ordered for the hyperreflexia and neck pain. She will be scheduled for a 1-hour EEG, then a 24-hour EEG. We discussed [place] driving laws that if she starts having loss of awareness, would stop driving. Follow-up after tests, she knows to call for any changes.   Thank you for allowing me to participate in the care of this patient. Please do not hesitate to call for any  questions or concerns.   Ellouise Newer, M.D.  CC: Dr. Redmond School

## 2018-09-08 NOTE — Patient Instructions (Addendum)
1. Schedule MRI brain with and without contrast 2. Schedule MRA head without contrast 3. Schedule MRI cervical spine with and without contrast  We have sent a referral to [facility] for your MRI/MRA(s) and they will call you directly to schedule your appt. They are located at [place]. If you need to contact them directly please call [phone].  4. Schedule 1-hour EEG, then 24-hour EEG 5. Follow-up after tests, call for any changes

## 2018-09-16 ENCOUNTER — Telehealth: Payer: Self-pay | Admitting: Gastroenterology

## 2018-09-16 ENCOUNTER — Other Ambulatory Visit: Payer: BLUE CROSS/BLUE SHIELD

## 2018-09-16 NOTE — Telephone Encounter (Signed)
Left message to call back  

## 2018-09-16 NOTE — Telephone Encounter (Signed)
Mrs.Davis called wanting to speak with the nurse about pt. She stated that pt tested positive for HepB and she see that she is getting Vaccines. She is needing to discuss this with the nurse.

## 2018-09-29 ENCOUNTER — Ambulatory Visit (INDEPENDENT_AMBULATORY_CARE_PROVIDER_SITE_OTHER): Payer: Medicaid Other | Admitting: Family Medicine

## 2018-09-29 ENCOUNTER — Other Ambulatory Visit: Payer: Self-pay

## 2018-09-29 ENCOUNTER — Encounter: Payer: Self-pay | Admitting: Family Medicine

## 2018-09-29 DIAGNOSIS — G43109 Migraine with aura, not intractable, without status migrainosus: Secondary | ICD-10-CM | POA: Diagnosis not present

## 2018-09-29 MED ORDER — ZOLMITRIPTAN 5 MG PO TABS: 5.0000 mg | ORAL_TABLET | ORAL | 5 refills | Status: DC | PRN

## 2018-09-29 NOTE — Progress Notes (Signed)
   Subjective:    Patient ID: Elizabeth Phelps, female    DOB: 13-Oct-1955, 63 y.o.   MRN: [id number]  HPI Documentation for virtual telephone encounter.  Documentation for virtual audio and video telecommunications through Zoom encounter: The patient was located at home. The provider was located in the office. The patient did consent to this visit and is aware of possible charges through their insurance for this visit. The other persons participating in this telemedicine service were none. This virtual service is not related to other E/M service within previous 7 days. She has a history of migraine headaches and rarely gets them but in the last months she has had several of these.  She does get an aura with them.  She also states that she is getting 2 or 3 regular headaches per week but they are usually controlled with taking 3 Tylenol at one time and they go away.  She thinks that stress might play a role in this but is not sure.  She has no underlying allergies.    Review of Systems     Objective:   Physical Exam Alert and in no distress otherwise not examined       Assessment & Plan:  Migraine with aura and without status migrainosus, not intractable - Plan: zolmitriptan (ZOMIG) 5 MG tablet I will renew her Zomig.  She is to keep track of her headaches to see if there is a pattern to her regular headaches as well as to her migraine.  She will keep me informed concerning this.

## 2018-10-01 ENCOUNTER — Telehealth: Payer: Self-pay | Admitting: Neurology

## 2018-10-01 NOTE — Telephone Encounter (Signed)
She said that she has the information to the financial assistance program but is trying for medicaid.  I will have her set up an e-visit.  Thanks.

## 2018-10-01 NOTE — Telephone Encounter (Signed)
Would she be able to get the MRI at all is she is on the [place]? I know they are still doing MRIs because a patient of mine had a non-urgent one recently. I can do an evisit with her, thanks

## 2018-10-01 NOTE — Telephone Encounter (Signed)
Patient lmom regarding tests that had been cancelled due to the Virus. She said she lost her Job yesterday due to Neurological issues. She said that her Symptoms are worse. She would like to speak with you. Please Call. Thanks

## 2018-10-01 NOTE — Telephone Encounter (Signed)
I called patient back and she just lost her job yesterday.  She was working at [employer] and her drawer came up short x 2.  She was put on probation yesterday a customer came in and wanted change for his $100.  She does not remember counting the money but the video showed her counting it twice.  The customer said that he only got $80 back so she gave him another $20.  Her drawer came up short again.  Her EEG has been postponed and she has no insurance but is applying for Medicaid.  Do you need to see her via e-visit?

## 2018-10-07 ENCOUNTER — Telehealth: Payer: Self-pay | Admitting: Neurology

## 2018-10-07 ENCOUNTER — Other Ambulatory Visit: Payer: Self-pay

## 2018-10-23 ENCOUNTER — Ambulatory Visit: Payer: BLUE CROSS/BLUE SHIELD | Admitting: Neurology

## 2018-11-12 ENCOUNTER — Telehealth: Payer: Self-pay | Admitting: Gastroenterology

## 2018-11-12 NOTE — Telephone Encounter (Signed)
[place] with [facility] Dept. [phone].  Back, reference questions about patient. Got voice mail. Left message to call back again

## 2018-11-12 NOTE — Telephone Encounter (Signed)
[name] from [facility] Department called to discuss pt's recent level for Hep B.

## 2018-11-13 ENCOUNTER — Other Ambulatory Visit: Payer: Self-pay | Admitting: Family Medicine

## 2018-11-13 DIAGNOSIS — E785 Hyperlipidemia, unspecified: Secondary | ICD-10-CM

## 2018-12-02 ENCOUNTER — Telehealth: Payer: Self-pay | Admitting: Family Medicine

## 2018-12-02 NOTE — Telephone Encounter (Signed)
Pt come by and needs her lipitor changed [facility] [place] [facility] [place] states she never got the one in may, pt can be reached at [phone]

## 2018-12-03 ENCOUNTER — Other Ambulatory Visit: Payer: Self-pay

## 2018-12-03 DIAGNOSIS — E785 Hyperlipidemia, unspecified: Secondary | ICD-10-CM

## 2018-12-03 MED ORDER — ATORVASTATIN CALCIUM 20 MG PO TABS: 20.0000 mg | ORAL_TABLET | Freq: Every day | ORAL | 0 refills | Status: DC

## 2018-12-03 NOTE — Telephone Encounter (Signed)
Sent refill to [facility]

## 2018-12-03 NOTE — Telephone Encounter (Signed)
Correction [facility] [place]

## 2018-12-25 ENCOUNTER — Other Ambulatory Visit: Payer: Self-pay | Admitting: Family Medicine

## 2018-12-25 DIAGNOSIS — F341 Dysthymic disorder: Secondary | ICD-10-CM

## 2018-12-28 ENCOUNTER — Telehealth: Payer: Self-pay | Admitting: Family Medicine

## 2018-12-28 DIAGNOSIS — F341 Dysthymic disorder: Secondary | ICD-10-CM

## 2018-12-28 MED ORDER — PAROXETINE HCL 20 MG PO TABS: 20.0000 mg | ORAL_TABLET | Freq: Every day | ORAL | 1 refills | Status: DC

## 2018-12-28 NOTE — Telephone Encounter (Signed)
Pt needs refill on Paxil  sent to the [facility] in [place]

## 2018-12-28 NOTE — Telephone Encounter (Signed)
Is this okay to refill? 

## 2019-02-01 ENCOUNTER — Telehealth: Payer: Self-pay | Admitting: *Deleted

## 2019-02-01 NOTE — Telephone Encounter (Signed)
[name] from [facility] Department called to ask questions on if the patient receive her 3rd Hep B vaccination. It was reported that the patient has not received her vaccination and has not had a follow up visit with Dr. Loletha Carrow.

## 2019-05-17 ENCOUNTER — Telehealth: Payer: Self-pay | Admitting: Family Medicine

## 2019-05-17 DIAGNOSIS — G43109 Migraine with aura, not intractable, without status migrainosus: Secondary | ICD-10-CM

## 2019-05-17 MED ORDER — ZOLMITRIPTAN 5 MG PO TABS: 5.0000 mg | ORAL_TABLET | ORAL | 5 refills | Status: DC | PRN

## 2019-05-17 NOTE — Telephone Encounter (Signed)
Pt called and is requesting a refill on her Zomig pt would like it sent to the [place], [place] - [phone]

## 2019-05-25 ENCOUNTER — Telehealth: Payer: Self-pay

## 2019-05-25 DIAGNOSIS — K743 Primary biliary cirrhosis: Secondary | ICD-10-CM | POA: Diagnosis not present

## 2019-05-25 DIAGNOSIS — K7469 Other cirrhosis of liver: Secondary | ICD-10-CM | POA: Diagnosis not present

## 2019-05-25 MED ORDER — SUMATRIPTAN SUCCINATE 100 MG PO TABS: 100.0000 mg | ORAL_TABLET | ORAL | 0 refills | Status: DC | PRN

## 2019-05-25 NOTE — Telephone Encounter (Signed)
Pt. Called LM stating that her Zolmitriptan needed a PA I called and checked with the pharmacy they said a PA was already submitted but it was denied and they wanted to know if you wanted to changed the pts. Medication to Sumatriptan that should be covered by her insurance. Please advise.

## 2019-05-29 ENCOUNTER — Other Ambulatory Visit: Payer: Self-pay

## 2019-05-29 ENCOUNTER — Ambulatory Visit
Admission: RE | Admit: 2019-05-29 | Discharge: 2019-05-29 | Disposition: A | Discharge status: Home / Self Care | Payer: Medicaid Other | Source: Ambulatory Visit | Attending: Neurology | Admitting: Neurology

## 2019-05-29 DIAGNOSIS — R413 Other amnesia: Secondary | ICD-10-CM

## 2019-05-29 DIAGNOSIS — G43909 Migraine, unspecified, not intractable, without status migrainosus: Secondary | ICD-10-CM | POA: Diagnosis not present

## 2019-05-29 MED ORDER — GADOBENATE DIMEGLUMINE 529 MG/ML IV SOLN
10.0000 mL | Freq: Once | INTRAVENOUS | Status: AC | PRN
  Administered 2019-05-29: 10 mL via INTRAVENOUS

## 2019-06-01 ENCOUNTER — Other Ambulatory Visit: Payer: Self-pay | Admitting: Nurse Practitioner

## 2019-06-01 ENCOUNTER — Telehealth: Payer: Self-pay

## 2019-06-01 DIAGNOSIS — G3184 Mild cognitive impairment, so stated: Secondary | ICD-10-CM

## 2019-06-01 DIAGNOSIS — K7469 Other cirrhosis of liver: Secondary | ICD-10-CM

## 2019-06-01 DIAGNOSIS — R413 Other amnesia: Secondary | ICD-10-CM

## 2019-06-01 NOTE — Telephone Encounter (Signed)
Having memory problems. Unable to translate what she is thinking. Cannot get words out or it takes a long time. States that she fell recently and injured her knee. States that she struggles to form sentences. Migraines have become worse. Not sure what to do about it.

## 2019-06-01 NOTE — Telephone Encounter (Signed)
-----   Message from [place] Sprang, MD sent at 06/01/2019  3:47 PM EST ----- Pls let her know that the MRI brain and MRA is unchanged from her brain scan in 2017. Unchanged aneurysm, no evidence of tumor, stroke, or bleed. Thanks

## 2019-06-02 ENCOUNTER — Other Ambulatory Visit: Payer: Self-pay

## 2019-06-02 DIAGNOSIS — R413 Other amnesia: Secondary | ICD-10-CM

## 2019-06-02 DIAGNOSIS — G3184 Mild cognitive impairment, so stated: Secondary | ICD-10-CM

## 2019-06-02 NOTE — Telephone Encounter (Signed)
Pls let her know I would like to do memory testing for her (Neurocognitive testing), pls order Neuropsych with Dr. Melvyn Novas. I have an opening on 12/16 at 1:30pm so we can discuss migraines, can she do virtual visit then? Thanks

## 2019-06-02 NOTE — Addendum Note (Signed)
Addended by: Amado Coe on: 06/02/2019 09:54 AM   Modules accepted: Orders

## 2019-06-02 NOTE — Telephone Encounter (Signed)
Patient aware of testing referral and appointment.

## 2019-06-03 DIAGNOSIS — H2512 Age-related nuclear cataract, left eye: Secondary | ICD-10-CM | POA: Diagnosis not present

## 2019-06-03 DIAGNOSIS — Z961 Presence of intraocular lens: Secondary | ICD-10-CM | POA: Diagnosis not present

## 2019-06-03 DIAGNOSIS — H18513 Endothelial corneal dystrophy, bilateral: Secondary | ICD-10-CM | POA: Diagnosis not present

## 2019-06-08 ENCOUNTER — Encounter: Payer: Self-pay | Admitting: Neurology

## 2019-06-09 ENCOUNTER — Telehealth (INDEPENDENT_AMBULATORY_CARE_PROVIDER_SITE_OTHER): Payer: Medicaid Other | Admitting: Neurology

## 2019-06-09 ENCOUNTER — Other Ambulatory Visit: Payer: Self-pay | Admitting: Family Medicine

## 2019-06-09 ENCOUNTER — Other Ambulatory Visit: Payer: Self-pay

## 2019-06-09 VITALS — Ht 63.5 in | Wt 120.0 lb

## 2019-06-09 DIAGNOSIS — R413 Other amnesia: Secondary | ICD-10-CM | POA: Diagnosis not present

## 2019-06-09 DIAGNOSIS — E785 Hyperlipidemia, unspecified: Secondary | ICD-10-CM

## 2019-06-09 DIAGNOSIS — Z9889 Other specified postprocedural states: Secondary | ICD-10-CM

## 2019-06-09 DIAGNOSIS — G43109 Migraine with aura, not intractable, without status migrainosus: Secondary | ICD-10-CM

## 2019-06-09 DIAGNOSIS — Z8679 Personal history of other diseases of the circulatory system: Secondary | ICD-10-CM

## 2019-06-09 DIAGNOSIS — R519 Headache, unspecified: Secondary | ICD-10-CM

## 2019-06-09 MED ORDER — ZOLMITRIPTAN 5 MG PO TABS: 5.0000 mg | ORAL_TABLET | ORAL | 11 refills | Status: DC | PRN

## 2019-06-09 MED ORDER — GABAPENTIN 300 MG PO CAPS: ORAL_CAPSULE | ORAL | 11 refills | Status: DC

## 2019-06-09 NOTE — Progress Notes (Addendum)
Virtual Visit via Video Note The purpose of this virtual visit is to provide medical care while limiting exposure to the novel coronavirus.    Consent was obtained for video visit:  Yes.   Answered questions that patient had about telehealth interaction:  Yes.   I discussed the limitations, risks, security and privacy concerns of performing an evaluation and management service by telemedicine. I also discussed with the patient that there may be a patient responsible charge related to this service. The patient expressed understanding and agreed to proceed.  Pt location: Home Physician Location: office Name of referring provider:  Denita Lung, MD I connected with Elizabeth Phelps at patients initiation/request on 06/09/2019 at  1:30 PM EST by video enabled telemedicine application and verified that I am speaking with the correct person using two identifiers. Pt MRN:  [id number] Pt DOB:  1955/12/17 Video Participants:  Elizabeth Phelps   History of Present Illness:  The patient was seen as a virtual video visit on 06/09/2019. She was last seen 9 months ago in the neurology clinic for memory loss. She had some cognitive changes after brain surgery in 2002, however symptoms significantly worsened around a year ago. MOCA score 22/30 in March 2020. Since her last visit, she had called our office to report that she lost her job at [employer] after her drawer came up short twice. She was put on probation after a customer came in wanting change for his $100. She did not remember counting the money but the video showed her counting it twice, customer said he only got $80 back so she gave him another $20, her drawer came up short again. She drives minimally because she gets turned around. She has put an alarm on her phone for bills, she has an appointment book and needs everything written down. She has been told by her roommates that they need to call her name 2-3 times, she "goes in and out." She states  her brain just won't cut off, she is constantly thinking about things that happened in her childhood, "something goes there and that is when I bleep out." No prior history of physical or emotional trauma. She has also been dealing with bad headaches. Imitrex does not help, she ran out of Zomig which helps. She recalls taking a beta-blocker in the past which did not help headaches. She describes visual aura with bright flashing lights "fourth of July comes out" prior to a headaches with burning pain on the right temple then headache. There is associated nausea. She has had 3 migraines so far this month. She fell last week, she is not sure if her leg gave out or she tripped, she is not sure if she blacked out but was on the ground when she came to. Her right knee is still swollen. She has occasional numbness in her fingertips.   I personally reviewed MRI/MRA brain done 05/2019: No acute changes. There were post-op changes from right pterional craniotomy with underlying chronic post-operative and/or ischemic encephalomalacia in the right temporal lobe and right basal ganglia. There is a Chiari 1 malfomation with tonsils extending up to 31mm through foramen magnum, stable from prior. There is diffuse atrophy and chronic microvascular disease, mildly progressed from 2017. MRA showed combination of fusiform and saccular right MCA aneurysm originating from the right M1 segment, similar to 2017 imaging. She also had an MRI cervical spine due to hyperreflexia, no cord abnormalities seen. There was multilevel cervical spondylosis with resultant mild  spinal stenosis at C3-4, C4-5, and C6-7. No cord impingement; mild left C4 and right C5 foraminal stenosis related to disc bulge and uncovertebral disease.   History on Initial Assessment 09/08/2018: This is a pleasant 63 year old right-handed woman with a history of hyperlipidemia, tobacco use, anxiety, migraine with aura, right MCA aneurysm s/p craniotomy, presenting for  evaluation of worsening memory. She started having memory issues after her brain surgery in 2002, however she continued to function quite well and states she has always been on top of things until around October 2019. At that time, she started noticing word-finding difficulties, [name] has noticed this as well, she would stop mid-sentence unable to remember a word. She would hear something but would not comprehend what people are saying, asking them to repeat it several times. She feels like her brain is "short-circuiting." She reports her long-term memory is "awesome" but she cannot remember what she ate yesterday. She has gotten lost driving and uses her GPS all the time even for familiar roads. 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 There were 3 days last week where she was driving to work then she suddenly realizes she is there but does not remember the trip. [name] was talking to her one time while patient was driving then realized she was at work, and did not notice any confusion. 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 She states numbers have always been her thing but now she cannot do math anymore. She lives with 2 roommates who were the first ones to notice how forgetful she has been. She has been told she is "off somewhere" or "zoning out" and would need to be called a couple of times to get her attention. She has noticed she gets frustrated and agitated more easily. She was anxious after her brain surgery in 2002 and was started on Paxil which helped, she does not feel it is helping much now.  She has also started having involuntary left hand shaking lasting a few minutes. Leg is unaffected. There is no associated speech difficulty, confusion, or weakness when she has the shaking.  She has numbness and tingling in her finger tips. She has been having more headaches since October, with good response to Excedrin. She has dizziness when she gets up from bed in the morning or in the middle of the night, this is when she usually falls. Last fall was a week ago. She has noticed that if she looks at something sometimes, she sees something different, then looks again and sees what the object actually is. No olfactory/gustatory hallucinations. She has nausea when dizzy, but also has waves of nausea throughout the day. Sleep is fine most of the time. She has not woken up with tongue bite or incontinence. Her mother had memory issues after several strokes. Her father had a history of cerebral aneurysm as well. She had a head injury in the 1980s. No alcohol use. There is no family history of seizures.   Her last brain imaging was done in January 2017. I personally reviewed images, MRI brain without contrast showed encephalomalacia of the right temporal lobe, right lateral lenticulostriate territory chronic infarct affecting the external capsule, periventricular white matter, patulous right temporal horn. Cerebellar tonsils are 59mm  below foramen magnum. MRA head showed combination fusiform and saccular aneurysm of the right MCA throughout M1 segment, appearing similar in size to angiogram in 2002.     Current Outpatient Medications on File Prior to Visit  Medication Sig Dispense Refill  . ibuprofen (ADVIL,MOTRIN) 600 MG tablet Take 1 tablet (600 mg total) by mouth every 6 (six) hours as needed. 30 tablet 0  . Multiple Vitamin (MULTIVITAMIN WITH MINERALS) TABS Take 1 tablet by mouth daily.    Alvino Blood 5 MG TABS      No current facility-administered medications on file prior to visit.     Observations/Objective:   Vitals:   06/08/19 1512  Weight: 120 lb (54.4 kg)  Height: 5' 3.5" (1.613 m)   GEN:  The patient appears stated age and is in NAD.  Neurological examination: Patient is  awake, alert, oriented x 3. No aphasia or dysarthria. Intact fluency and comprehension. Remote and recent memory impaired. Cranial nerves: Extraocular movements intact with no nystagmus. No facial asymmetry. Motor: moves all extremities symmetrically, at least anti-gravity x 4. No incoordination on finger to nose testing. Gait: narrow-based and steady, able to tandem walk adequately. Negative Romberg test.   Assessment and Plan:   This is a 63 yo RH woman with a history of  hyperlipidemia, tobacco use, anxiety, migraine with aura, right MCA aneurysm s/p craniotomy, with worsening memory. Memory changes have worsened to the point she has lost her job. MOCA score in 08/2018 was 22/30. MRI brain no acute changes, there is right temporal encephalomalacia, stable aneurysm. Proceed with Neurocognitive testing as planned. She also reports being told of staring/blanking out, we discussed proceeding with EEG to assess for seizure activity and potential subclinical seizures causing memory issues. She is reporting more migraines with aura, we discussed starting low dose gabapentin 100mg  at bedtime, side effects discussed, we may uptitrate as tolerated. She had good response to prn Zomig, refills sent. Follow-up after testing, she knows to call for any changes.    Follow Up Instructions:   -I discussed the assessment and treatment plan with the patient. The patient was provided an opportunity to ask questions and all were answered. The patient agreed with the plan and demonstrated an understanding of the instructions.   The patient was advised to call back or seek an in-person evaluation if the symptoms worsen or if the condition fails to improve as anticipated.   Cameron Sprang, MD

## 2019-06-09 NOTE — Telephone Encounter (Signed)
Pt called for refill. She is completely out. Pt can be reached at [phone].

## 2019-06-09 NOTE — Telephone Encounter (Signed)
Pt. Has been scheduled for a med check apt. Tomorrow.

## 2019-06-09 NOTE — Telephone Encounter (Signed)
Time for an appointment.

## 2019-06-10 ENCOUNTER — Encounter: Payer: Self-pay | Admitting: Family Medicine

## 2019-06-10 ENCOUNTER — Other Ambulatory Visit: Payer: Self-pay

## 2019-06-10 ENCOUNTER — Ambulatory Visit: Payer: Medicaid Other | Admitting: Family Medicine

## 2019-06-10 ENCOUNTER — Ambulatory Visit
Admission: RE | Admit: 2019-06-10 | Discharge: 2019-06-10 | Disposition: A | Discharge status: Home / Self Care | Payer: Medicaid Other | Source: Ambulatory Visit | Attending: Nurse Practitioner | Admitting: Nurse Practitioner

## 2019-06-10 VITALS — BP 124/82 | HR 95 | Temp 98.2°F | Wt 124.6 lb

## 2019-06-10 DIAGNOSIS — Z23 Encounter for immunization: Secondary | ICD-10-CM | POA: Diagnosis not present

## 2019-06-10 DIAGNOSIS — E785 Hyperlipidemia, unspecified: Secondary | ICD-10-CM | POA: Diagnosis not present

## 2019-06-10 DIAGNOSIS — K743 Primary biliary cirrhosis: Secondary | ICD-10-CM

## 2019-06-10 DIAGNOSIS — F17200 Nicotine dependence, unspecified, uncomplicated: Secondary | ICD-10-CM

## 2019-06-10 DIAGNOSIS — G43109 Migraine with aura, not intractable, without status migrainosus: Secondary | ICD-10-CM | POA: Diagnosis not present

## 2019-06-10 DIAGNOSIS — K7460 Unspecified cirrhosis of liver: Secondary | ICD-10-CM | POA: Diagnosis not present

## 2019-06-10 DIAGNOSIS — F341 Dysthymic disorder: Secondary | ICD-10-CM

## 2019-06-10 DIAGNOSIS — I700 Atherosclerosis of aorta: Secondary | ICD-10-CM

## 2019-06-10 DIAGNOSIS — K7469 Other cirrhosis of liver: Secondary | ICD-10-CM

## 2019-06-10 MED ORDER — PAROXETINE HCL 30 MG PO TABS: 30.0000 mg | ORAL_TABLET | Freq: Every day | ORAL | 1 refills | Status: DC

## 2019-06-10 NOTE — Progress Notes (Signed)
   Subjective:    Patient ID: Elizabeth Phelps, female    DOB: 1956/01/17, 63 y.o.   MRN: [id number]  HPI She is here for an interval evaluation.  She is now on Medicaid.  She is now seeing Dr. Delice Lesch for her migraine headache management.  She was recently switched to gabapentin.  She did try Imitrex and did have unacceptable side effect from that medicine causing her to feel loopy.  She would like to be placed back on Medicaid.  She also has a history of primary biliary cholangitis and is being followed by GI for that.  Psychologically she is not where she would like to be.  Presently she is taking Paxil and like to have that readjusted. She smokes and is not interested in quitting.  She does have a history of atherosclerosis.  She has no other concerns or complaints.  She plans to see her gynecologist for Pap and mammogram.  Review of Systems     Objective:   Physical Exam Alert and in no distress. Tympanic membranes and canals are normal. Pharyngeal area is normal. Neck is supple without adenopathy or thyromegaly. Cardiac exam shows a regular sinus rhythm without murmurs or gallops. Lungs are clear to auscultation.  Abdominal exam        Assessment & Plan:  Dysthymia - Plan: PARoxetine (PAXIL) 30 MG tablet  Migraine with aura and without status migrainosus, not intractable  Primary biliary cholangitis ([place]) - Plan: CBC with Differential, Comprehensive metabolic panel  Current smoker  Atherosclerosis of aorta ([facility]) - Plan: Lipid panel  Hyperlipidemia LDL goal <100 - Plan: Lipid panel  Need for influenza vaccination - Plan: Flu Vaccine QUAD 6+ mos PF IM (Fluarix Quad PF) She will continue to follow-up with GI and neurology.  As stated earlier she is not interested in quitting smoking.  I will increase her Paxil and recommend she call me in 1 month to let me know how she is doing. Also recommend that she let Dr. Delice Lesch know that the Imitrex did not work and that she wants to get back  on Zomig.

## 2019-06-11 LAB — CBC WITH DIFFERENTIAL/PLATELET
Basophils Absolute: 0.1 10*3/uL (ref 0.0–0.2)
Basos: 1 %
EOS (ABSOLUTE): 0 10*3/uL (ref 0.0–0.4)
Eos: 0 %
Hematocrit: 42.3 % (ref 34.0–46.6)
Hemoglobin: 13.9 g/dL (ref 11.1–15.9)
Immature Grans (Abs): 0 10*3/uL (ref 0.0–0.1)
Immature Granulocytes: 0 %
Lymphocytes Absolute: 2.7 10*3/uL (ref 0.7–3.1)
Lymphs: 33 %
MCH: 28.3 pg (ref 26.6–33.0)
MCHC: 32.9 g/dL (ref 31.5–35.7)
MCV: 86 fL (ref 79–97)
Monocytes Absolute: 0.7 10*3/uL (ref 0.1–0.9)
Monocytes: 9 %
Neutrophils Absolute: 4.6 10*3/uL (ref 1.4–7.0)
Neutrophils: 57 %
Platelets: 186 10*3/uL (ref 150–450)
RBC: 4.91 x10E6/uL (ref 3.77–5.28)
RDW: 12.7 % (ref 11.7–15.4)
WBC: 8.1 10*3/uL (ref 3.4–10.8)

## 2019-06-11 LAB — COMPREHENSIVE METABOLIC PANEL
ALT: 35 IU/L — ABNORMAL HIGH (ref 0–32)
AST: 65 IU/L — ABNORMAL HIGH (ref 0–40)
Albumin/Globulin Ratio: 1.1 — ABNORMAL LOW (ref 1.2–2.2)
Albumin: 4 g/dL (ref 3.8–4.8)
Alkaline Phosphatase: 120 IU/L — ABNORMAL HIGH (ref 39–117)
BUN/Creatinine Ratio: 14 (ref 12–28)
BUN: 11 mg/dL (ref 8–27)
Bilirubin Total: 0.5 mg/dL (ref 0.0–1.2)
CO2: 24 mmol/L (ref 20–29)
Calcium: 9.4 mg/dL (ref 8.7–10.3)
Chloride: 106 mmol/L (ref 96–106)
Creatinine, Ser: 0.79 mg/dL (ref 0.57–1.00)
GFR calc Af Amer: 92 mL/min/{1.73_m2} (ref >59)
GFR calc non Af Amer: 80 mL/min/{1.73_m2} (ref >59)
Globulin, Total: 3.5 g/dL (ref 1.5–4.5)
Glucose: 84 mg/dL (ref 65–99)
Potassium: 4.6 mmol/L (ref 3.5–5.2)
Sodium: 140 mmol/L (ref 134–144)
Total Protein: 7.5 g/dL (ref 6.0–8.5)

## 2019-06-11 LAB — LIPID PANEL
Chol/HDL Ratio: 3.2 ratio (ref 0.0–4.4)
Cholesterol, Total: 178 mg/dL (ref 100–199)
HDL: 56 mg/dL (ref >39)
LDL Chol Calc (NIH): 105 mg/dL — ABNORMAL HIGH (ref 0–99)
Triglycerides: 96 mg/dL (ref 0–149)
VLDL Cholesterol Cal: 17 mg/dL (ref 5–40)

## 2019-06-15 ENCOUNTER — Encounter: Payer: Self-pay | Admitting: Neurology

## 2019-06-16 DIAGNOSIS — K7469 Other cirrhosis of liver: Secondary | ICD-10-CM | POA: Diagnosis not present

## 2019-06-16 DIAGNOSIS — K743 Primary biliary cirrhosis: Secondary | ICD-10-CM | POA: Diagnosis not present

## 2019-06-28 ENCOUNTER — Other Ambulatory Visit: Payer: Self-pay

## 2019-06-28 ENCOUNTER — Ambulatory Visit (INDEPENDENT_AMBULATORY_CARE_PROVIDER_SITE_OTHER): Payer: Medicaid Other | Admitting: Neurology

## 2019-06-28 DIAGNOSIS — Z9889 Other specified postprocedural states: Secondary | ICD-10-CM

## 2019-06-28 DIAGNOSIS — R292 Abnormal reflex: Secondary | ICD-10-CM | POA: Diagnosis not present

## 2019-06-28 DIAGNOSIS — M542 Cervicalgia: Secondary | ICD-10-CM

## 2019-06-28 DIAGNOSIS — R413 Other amnesia: Secondary | ICD-10-CM | POA: Diagnosis not present

## 2019-06-28 DIAGNOSIS — G3184 Mild cognitive impairment, so stated: Secondary | ICD-10-CM

## 2019-06-28 DIAGNOSIS — Z8679 Personal history of other diseases of the circulatory system: Secondary | ICD-10-CM

## 2019-06-30 NOTE — Procedures (Signed)
ELECTROENCEPHALOGRAM REPORT  Date of Study: 06/28/2019  Patient's Name: Elizabeth Phelps MRN: [id number] Date of Birth: [birth year]  Referring Provider: Dr. Ellouise Newer  Clinical History: This is a 64 year old woman with a history of right MCA aneurysm s/p craniotomy, with memory loss and episodes of staring/blanking out. EEG for classification.  Medications: ADVIL,MOTRIN 600 MG tablet MULTIVITAMIN WITH MINERALS TABS OCALIVA 5 MG TABS LIPITOR 20 MG tablet ZOMIG 5MG  tablet PAXIL 20 MG tablet PROTONIX 40 MG tablet ACTIGALL 500 MG tablet  Technical Summary: A multichannel digital EEG recording measured by the international 10-20 system with electrodes applied with paste and impedances below 5000 ohms performed in our laboratory with EKG monitoring in an awake and asleep patient.  Hyperventilation was not performed. Photic stimulation was performed.  The digital EEG was referentially recorded, reformatted, and digitally filtered in a variety of bipolar and referential montages for optimal display.    Description: The patient is awake and asleep during the recording.  During maximal wakefulness, there is a poorly formed medium voltage 7-7.5 Hz posterior dominant rhythm that attenuates with eye opening.  There is focal theta and delta slowing over the right hemisphere, maximal over the right frontal/temporal regions. During drowsiness and sleep, there is an increase in theta slowing of the background.  Vertex waves and symmetric sleep spindles were seen.  Photic stimulation did not elicit any abnormalities.  There were no epileptiform discharges or electrographic seizures seen.    EKG lead was unremarkable.  Impression: This awake and asleep EEG is abnormal due to occasional focal slowing over the right frontal/temporal regions.  Clinical Correlation of the above findings indicates focal cerebral dysfunction over the right frontal/temporal regions suggestive of underlying structural or  physiologic abnormality, consistent with patient's prior history. The absence of epileptiform discharges does not exclude a clinical diagnosis of epilepsy. Clinical correlation is advised.   Ellouise Newer, M.D.

## 2019-07-02 ENCOUNTER — Telehealth: Payer: Self-pay | Admitting: Neurology

## 2019-07-02 NOTE — Telephone Encounter (Signed)
Patient called with questions about her EEG results she's seen on MyChart. She has questions about the results.

## 2019-07-02 NOTE — Telephone Encounter (Signed)
Pt concerned about results. Was the EEG normal. Are the medications to fix the problem. Specifically concerned about The first sentence under the impression of exam.

## 2019-07-02 NOTE — Telephone Encounter (Signed)
Pt reassured and understood. No further concerns.

## 2019-07-02 NOTE — Telephone Encounter (Signed)
Pls let her know that the EEG is a reflection of the brain changes she has had from the past. It does not cause any of her symptoms. Nothing to worry about with the EEG, no evidence of seizure activity, which is what we were looking for. Thanks

## 2019-08-02 DIAGNOSIS — Z885 Allergy status to narcotic agent status: Secondary | ICD-10-CM | POA: Diagnosis not present

## 2019-08-02 DIAGNOSIS — K7460 Unspecified cirrhosis of liver: Secondary | ICD-10-CM | POA: Diagnosis not present

## 2019-08-02 DIAGNOSIS — H2512 Age-related nuclear cataract, left eye: Secondary | ICD-10-CM | POA: Diagnosis not present

## 2019-08-02 DIAGNOSIS — Z79899 Other long term (current) drug therapy: Secondary | ICD-10-CM | POA: Diagnosis not present

## 2019-08-02 DIAGNOSIS — Z20822 Contact with and (suspected) exposure to covid-19: Secondary | ICD-10-CM | POA: Diagnosis not present

## 2019-08-02 DIAGNOSIS — E7800 Pure hypercholesterolemia, unspecified: Secondary | ICD-10-CM | POA: Diagnosis not present

## 2019-08-04 DIAGNOSIS — Z885 Allergy status to narcotic agent status: Secondary | ICD-10-CM | POA: Diagnosis not present

## 2019-08-04 DIAGNOSIS — H2512 Age-related nuclear cataract, left eye: Secondary | ICD-10-CM | POA: Diagnosis not present

## 2019-08-04 DIAGNOSIS — E7800 Pure hypercholesterolemia, unspecified: Secondary | ICD-10-CM | POA: Diagnosis not present

## 2019-08-04 DIAGNOSIS — K7460 Unspecified cirrhosis of liver: Secondary | ICD-10-CM | POA: Diagnosis not present

## 2019-08-04 DIAGNOSIS — H547 Unspecified visual loss: Secondary | ICD-10-CM | POA: Diagnosis not present

## 2019-08-04 DIAGNOSIS — Z79899 Other long term (current) drug therapy: Secondary | ICD-10-CM | POA: Diagnosis not present

## 2019-08-04 DIAGNOSIS — Z20822 Contact with and (suspected) exposure to covid-19: Secondary | ICD-10-CM | POA: Diagnosis not present

## 2019-08-06 ENCOUNTER — Telehealth: Payer: Self-pay

## 2019-08-06 ENCOUNTER — Other Ambulatory Visit: Payer: Self-pay | Admitting: Neurology

## 2019-08-06 ENCOUNTER — Other Ambulatory Visit: Payer: Self-pay

## 2019-08-06 DIAGNOSIS — F341 Dysthymic disorder: Secondary | ICD-10-CM

## 2019-08-06 MED ORDER — PAROXETINE HCL 30 MG PO TABS: 30.0000 mg | ORAL_TABLET | Freq: Every day | ORAL | 0 refills | Status: DC

## 2019-08-06 NOTE — Telephone Encounter (Signed)
Pt Called informed that she has refills on file at the pharmacy

## 2019-08-06 NOTE — Telephone Encounter (Signed)
Patient called in regarding her Gabapentin medication. She said normally her pharmacy will send a refill request into the office. She will be going out of town tomorrow and she will be running out of her Gabapentin mid week. She would like to know if she can have her medication called in a few days early so she will have while out of town? She uses [facility] in [place], [place]. Please Call. Thank you

## 2019-08-06 NOTE — Telephone Encounter (Signed)
Pt. Called stating she needs a refill on Paroxetine and needs it called in today because she is going out of town tomorrow, I was told per Vickie to send this to you and then she would review it. Pt. Last apt. Was 06/10/19.

## 2019-08-06 NOTE — Telephone Encounter (Signed)
Per vickie ok to refill for 30 days

## 2019-09-02 ENCOUNTER — Other Ambulatory Visit: Payer: Self-pay

## 2019-09-02 ENCOUNTER — Encounter: Payer: Self-pay | Admitting: Psychology

## 2019-09-02 ENCOUNTER — Ambulatory Visit: Payer: Medicaid Other | Admitting: Psychology

## 2019-09-02 ENCOUNTER — Ambulatory Visit (INDEPENDENT_AMBULATORY_CARE_PROVIDER_SITE_OTHER): Payer: Medicaid Other | Admitting: Psychology

## 2019-09-02 DIAGNOSIS — R41840 Attention and concentration deficit: Secondary | ICD-10-CM | POA: Diagnosis not present

## 2019-09-02 DIAGNOSIS — F331 Major depressive disorder, recurrent, moderate: Secondary | ICD-10-CM

## 2019-09-02 DIAGNOSIS — F411 Generalized anxiety disorder: Secondary | ICD-10-CM

## 2019-09-02 DIAGNOSIS — I671 Cerebral aneurysm, nonruptured: Secondary | ICD-10-CM | POA: Diagnosis not present

## 2019-09-02 DIAGNOSIS — R4189 Other symptoms and signs involving cognitive functions and awareness: Secondary | ICD-10-CM

## 2019-09-02 HISTORY — DX: Attention and concentration deficit: R41.840

## 2019-09-02 NOTE — Progress Notes (Addendum)
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 Average       D-KEFS Design Fluency Test: Raw Score (Scaled Score) Percentile     Condition 1 - Filled Dots 8 (10) 50 Average    Condition 2 - Empty Dots 9 (10) 50 Average    Condition 3 - Switching 6 (10) 50 Average      Total Set Loss Errors 3 (10) 50 Average      Total Repetition Errors 3 (12) 75 Above Average       D-KEFS 20 Questions Test: Scaled Score Percentile     Total Weighted Achievement Score 15 95 Well Above Average    Initial Abstraction Score 11 63 Average       [place] Card Sorting Test [facility]): Raw Score Percentile     Categories (trials) 2 (64) 11-16 Below Average    Total Errors 31 8 Well Below Average    Perseverative Errors 15 21 Below Average    Non-Perseverative Errors 16 5 Well Below Average    Failure to Maintain Set 1 --- ---       Language:          Verbal Fluency Test: Raw Score (T Score) Percentile     Phonemic Fluency (FAS) 23 (35) 7 Well Below Average    Animal Fluency 15 (42) 21 Below Average        NAB Language Module, Form 1: T Score Percentile     Auditory Comprehension 37 9 Below Average    Naming 28/31 (39) 14 Below Average         Visuospatial/Visuoconstruction:  Raw Score Percentile   Clock Drawing: 10/10 --- Within Normal Limits       NAB Spatial Module, Form 1: T Score Percentile     Visual Discrimination 24 <1 Exceptionally Low    Figure Drawing Copy 50 50 Average    Figure Drawing Immediate Recall 46 34 Average        Scaled Score Percentile   WAIS-IV Block Design: 7 16 Below Average  WAIS-IV Visual Puzzles: 7 16 Below Average       Sensory-Motor:          [place] Grooved Pegboard Test: Raw Score Percentile     Dominant Hand 97 secs.,  2 drops  7 Well Below Average    Non-Dominant Hand 88 secs.,  0 drops  27 Average       Mood and Personality:      Raw Score Percentile   Beck Depression Inventory - II: 24 --- Moderate  PROMIS Anxiety Questionnaire: 23 --- Moderate       Additional Questionnaires:          Adult Self-Report Scale (Childhood): Raw Score Percentile     Inattention 20 --- Likely to have ADHD    Hyperactive/Impulsive 13 --- Unlikely to have ADHD       PROMIS Sleep Disturbance Questionnaire: 14 --- None to Slight   Informed Consent and Coding/Compliance:   Ms. Simonich was provided with a verbal description of the nature and purpose of the present neuropsychological evaluation. Also reviewed were the foreseeable risks and/or discomforts and benefits of the procedure, limits of confidentiality, and mandatory reporting requirements of this provider. The patient was given the opportunity to ask questions and receive answers about the evaluation. Oral consent to participate was provided by the patient.   This evaluation was conducted by Christia Reading, Ph.D., licensed clinical neuropsychologist. Ms. Escorcia completed a comprehensive clinical interview with Dr. Melvyn Novas, billed as one unit [phone], and 160 minutes of cognitive testing and scoring, billed as one unit [phone] and four additional units [id number]. Psychometrist Milana Kidney, B.S., assisted Dr. Melvyn Novas with test administration and  scoring procedures. As a separate and discrete service, Dr. Melvyn Novas spent a total of 180 minutes in interpretation and report writing billed as one unit [phone] and two units 96133.

## 2019-09-02 NOTE — Progress Notes (Signed)
   Psychometrician Note   Cognitive testing was administered to [name] by Milana Kidney, B.S. (psychometrist) under the supervision of Dr. Christia Reading, Ph.D., licensed psychologist. Ms. Sachdev did not appear overtly distressed by the testing session per behavioral observation or responses across self-report questionnaires. Dr. Christia Reading, Ph.D. checked in with Ms. Mauler as needed to manage any distress related to testing procedures (if applicable). Rest breaks were offered.    The battery of tests administered was selected by Dr. Christia Reading, Ph.D. with consideration to Ms. Perrone's current level of functioning, the nature of her symptoms, emotional and behavioral responses during interview, level of literacy, observed level of motivation/effort, and the nature of the referral question. This battery was communicated to the psychometrist. Communication between Dr. Christia Reading, Ph.D. and the psychometrist was ongoing throughout the evaluation and Dr. Christia Reading, Ph.D. was immediately accessible at all times. Dr. Christia Reading, Ph.D. provided supervision to the psychometrist on the date of this service to the extent necessary to assure the quality of all services provided.    KENETHA GILLERAN will return within approximately 1-2 weeks for an interactive feedback session with Dr. Melvyn Novas at which time her test performances, clinical impressions, and treatment recommendations will be reviewed in detail. Ms. Gehr understands she can contact our office should she require our assistance before this time.  A total of 160 minutes of billable time were spent face-to-face with Ms. Lloyd by the psychometrist. This includes both test administration and scoring time. Billing for these services is reflected in the clinical report generated by Dr. Christia Reading, Ph.D..  This note reflects time spent with the psychometrician and does not include test scores or any clinical  interpretations made by Dr. Melvyn Novas. The full report will follow in a separate note.

## 2019-09-07 IMAGING — CT CT ABD-PELV W/ CM
2 of 5 series · 16 of 46 positions shown, 18 images · IV contrast (APPLIED)
Comparison: Abdominal ultrasound September 30, 2017

CLINICAL DATA: Unintended weight loss for year. Elevated liver
function test.

EXAM:
CT ABDOMEN AND PELVIS WITH CONTRAST
TECHNIQUE: Multidetector CT imaging of the abdomen and pelvis was performed
using the standard protocol following bolus administration of
intravenous contrast.
CONTRAST:  100mL AHCVFD-7BB IOPAMIDOL (AHCVFD-7BB) INJECTION 61%

[Series 3: abd/ pelvis 5.0 i30f 2 · axial · 0.77mm/px · z∈[+602,+987]mm · 13 of 87 slices shown, 15 images]
[im 5/87  soft-tissue]
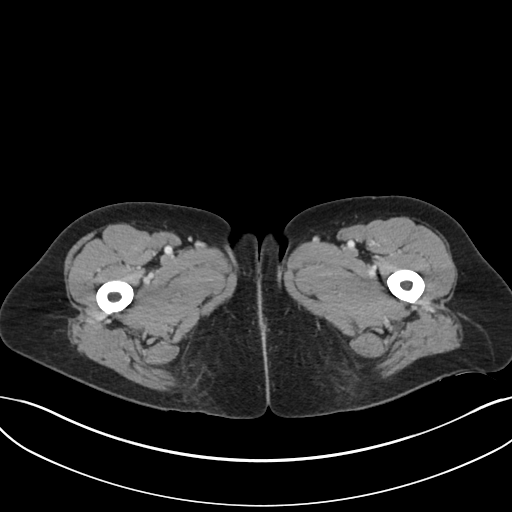
[im 5/87  bone]
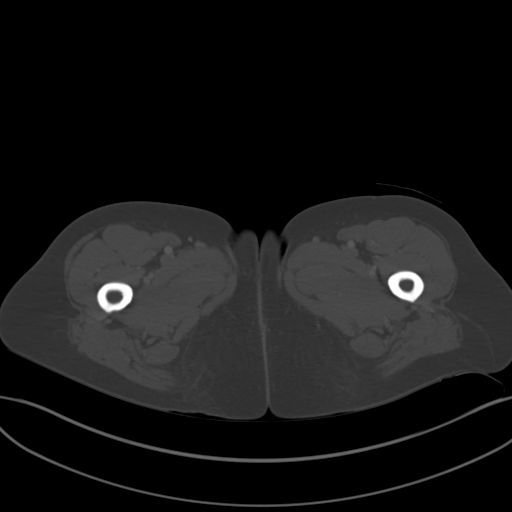
[im 14/87  soft-tissue]
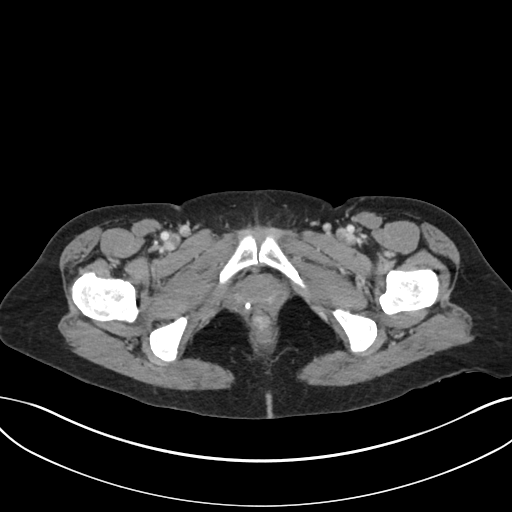
[im 19/87  soft-tissue]
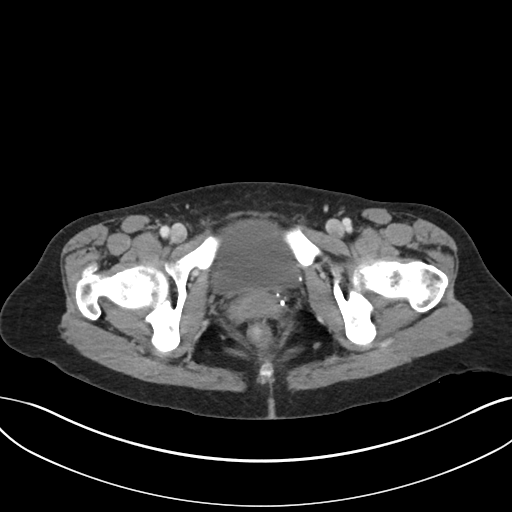
[im 23/87  soft-tissue]
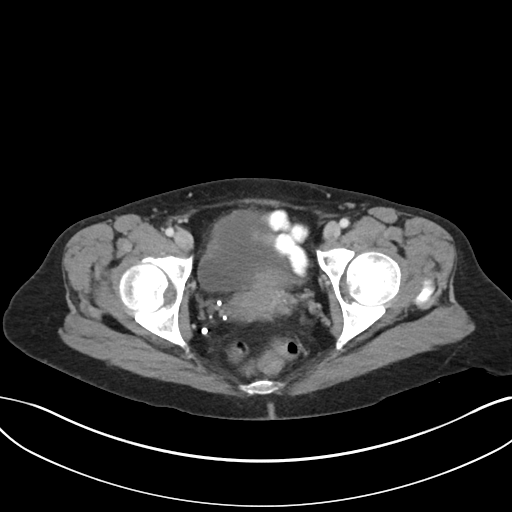
[im 32/87  soft-tissue]
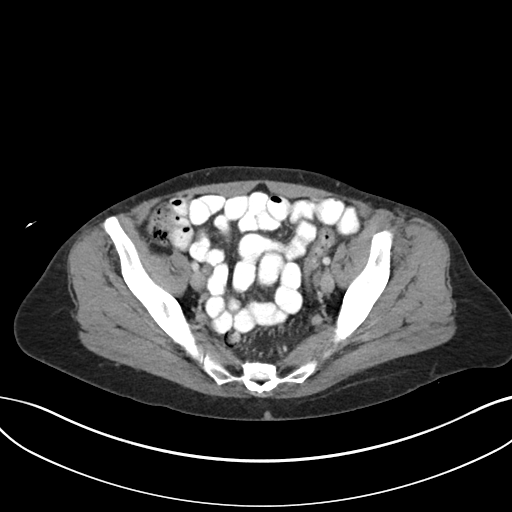
[im 37/87  soft-tissue]
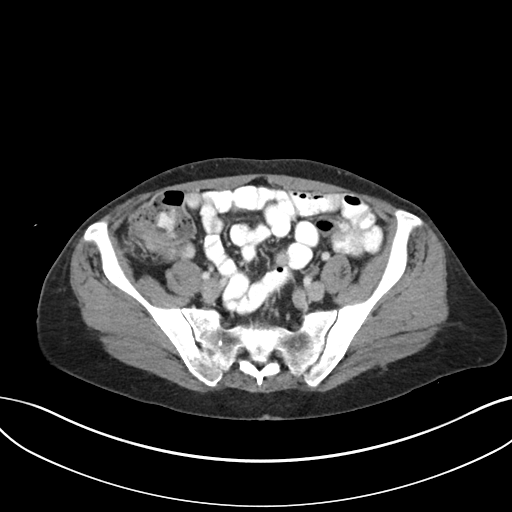
[im 46/87  soft-tissue]
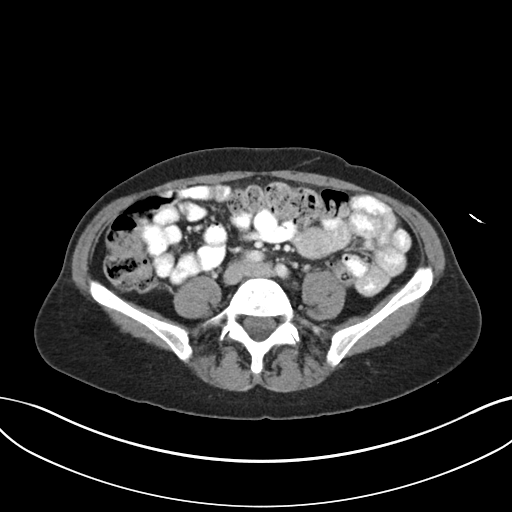
[im 50/87  soft-tissue]
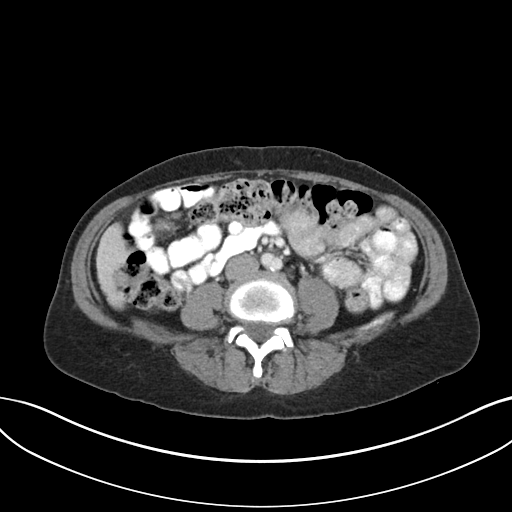
[im 55/87  soft-tissue]
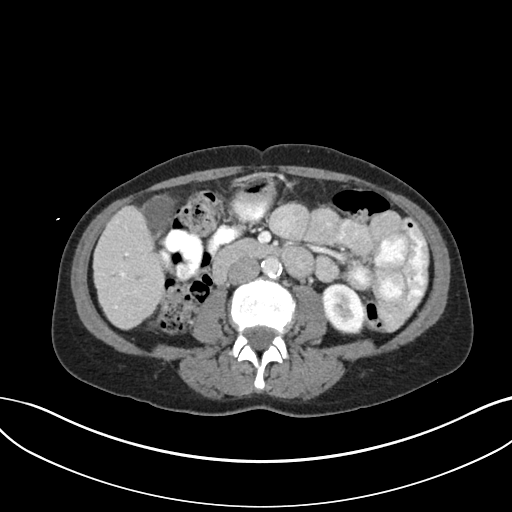
[im 55/87  bone]
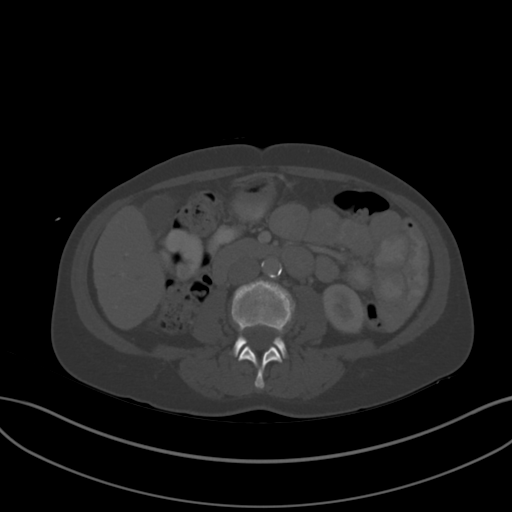
[im 64/87  soft-tissue]
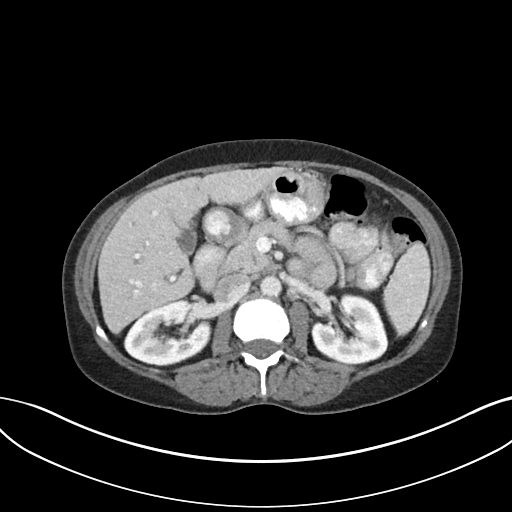
[im 68/87  soft-tissue]
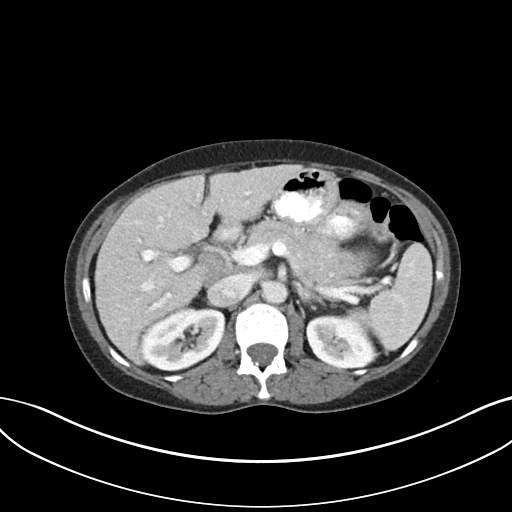
[im 73/87  soft-tissue]
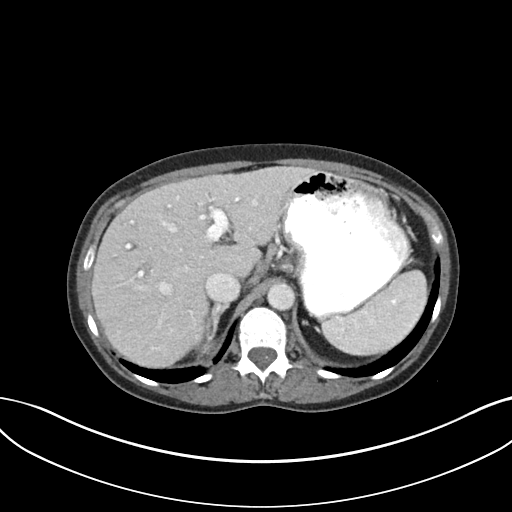
[im 82/87  soft-tissue]
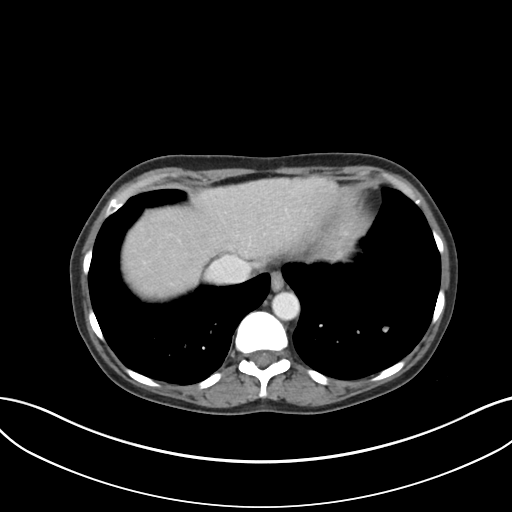

[Series 6: coronal soft tissue · coronal · 0.79mm/px · 3 of 79 slices shown]
[im 27/79  soft-tissue]
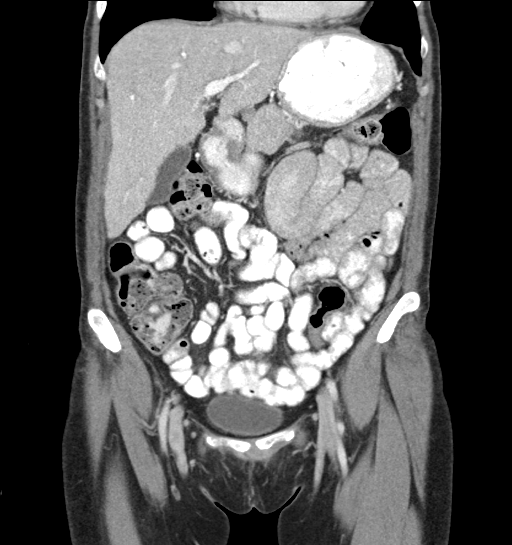
[im 35/79  soft-tissue]
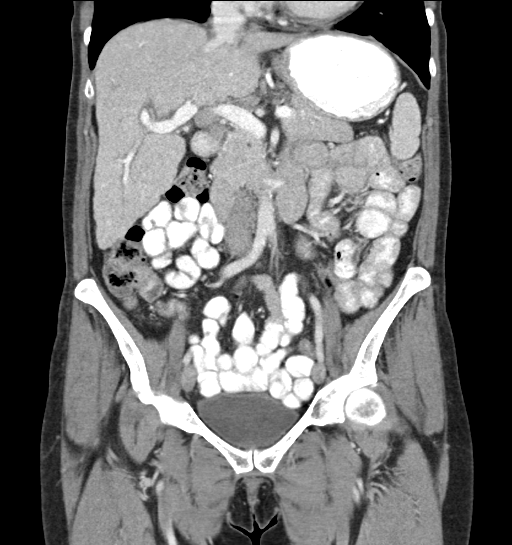
[im 44/79  soft-tissue]
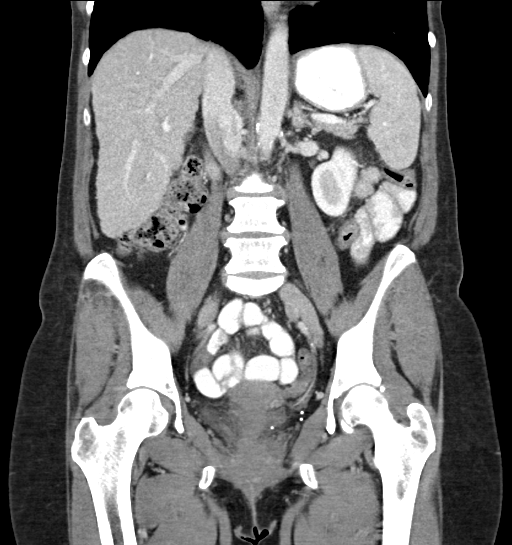

[16 of 46 positions shown; findings below may reference images not displayed]

FINDINGS: LOWER CHEST: Mild bibasilar atelectasis. Included heart size is
normal. No pericardial effusion.

HEPATOBILIARY: Nodular liver contour. Mildly enlarged LEFT lobe
caudate lobe without generalized hepatomegaly. Patent main portal
vein. No hepatic mass. Mild periportal edema. Normal gallbladder.

PANCREAS: Normal.

SPLEEN: Normal.

ADRENALS/URINARY TRACT: Kidneys are orthotopic, demonstrating
symmetric enhancement. No nephrolithiasis, hydronephrosis or solid
renal masses. The unopacified ureters are normal in course and
caliber. Delayed imaging through the kidneys demonstrates symmetric
prompt contrast excretion within the proximal urinary collecting
system. Urinary bladder is partially distended and unremarkable.
Normal adrenal glands.

STOMACH/BOWEL: The stomach, small and large bowel are normal in
course and caliber without inflammatory changes. Mild sigmoid
colonic diverticulosis. Mild amount of retained large bowel stool.
The appendix is not discretely identified, however there are no
inflammatory changes in the right lower quadrant.

VASCULAR/LYMPHATIC: Aortoiliac vessels are normal in course and
caliber. Mild calcific atherosclerosis. 18 mm short access portal
caval lymph node. 13 mm lymph node at porta hepatis. Phleboliths in
pelvis.

REPRODUCTIVE: Normal.

OTHER: No intraperitoneal free fluid or free air.

MUSCULOSKELETAL: Nonacute. Multilevel Schmorl's nodes. Mild lower
lumbar spondylosis with minimal grade 1 L3-4 through L5-S1
retrolisthesis. No spondylolysis.
IMPRESSION: 1. Cirrhosis without hepatic mass or failure.
2. Mild perihepatic lymphadenopathy is likely reactive.

Aortic Atherosclerosis ([id number]).

## 2019-09-08 ENCOUNTER — Other Ambulatory Visit: Payer: Self-pay | Admitting: Family Medicine

## 2019-09-08 ENCOUNTER — Telehealth: Payer: Self-pay | Admitting: Family Medicine

## 2019-09-08 DIAGNOSIS — E785 Hyperlipidemia, unspecified: Secondary | ICD-10-CM

## 2019-09-08 DIAGNOSIS — F341 Dysthymic disorder: Secondary | ICD-10-CM

## 2019-09-08 MED ORDER — PAROXETINE HCL 30 MG PO TABS: 30.0000 mg | ORAL_TABLET | Freq: Every day | ORAL | 1 refills | Status: DC

## 2019-09-08 NOTE — Telephone Encounter (Signed)
Pt needs refill paxil 30 mg to the [facility], [address]

## 2019-09-09 ENCOUNTER — Ambulatory Visit (INDEPENDENT_AMBULATORY_CARE_PROVIDER_SITE_OTHER): Payer: Medicaid Other | Admitting: Psychology

## 2019-09-09 ENCOUNTER — Encounter: Payer: Self-pay | Admitting: Psychology

## 2019-09-09 ENCOUNTER — Other Ambulatory Visit: Payer: Self-pay

## 2019-09-09 DIAGNOSIS — F329 Major depressive disorder, single episode, unspecified: Secondary | ICD-10-CM | POA: Insufficient documentation

## 2019-09-09 DIAGNOSIS — I671 Cerebral aneurysm, nonruptured: Secondary | ICD-10-CM | POA: Diagnosis not present

## 2019-09-09 DIAGNOSIS — F411 Generalized anxiety disorder: Secondary | ICD-10-CM | POA: Diagnosis not present

## 2019-09-09 DIAGNOSIS — F331 Major depressive disorder, recurrent, moderate: Secondary | ICD-10-CM

## 2019-09-09 NOTE — Progress Notes (Signed)
   Neuropsychology Feedback Session [name]. [place] Department of Neurology  Reason for Referral:   Elizabeth Lando McIntoshis a 64 y.o. right-handed Caucasian female referred by Ellouise Newer, M.D.,to characterize hercurrent cognitive functioning and assist with diagnostic clarity and treatment planning in the context of subjective cognitive decline, migraine headaches, and history of a right MCA aneurysm.  Feedback:   Ms. Ticknor completed a comprehensive neuropsychological evaluation on 09/02/2019. Please refer to that encounter for the full report and recommendations. Briefly, Scores across stand-alone and embedded performance validity measures were variable. As such, the results of the current evaluation may underestimate true cognitive abilities and lower scores should be interpreted with caution. If taken at face value, Ms. Mcneeley's pattern of performance is suggestive of weaknesses and performance variability across nearly all assessed cognitive domains. Variability was most apparent across processing speed, executive functioning, and visuospatial abilities, as well as learning and memory to a somewhat lesser extent. Receptive language was also lower than expected. Overall, the etiology of her deficits is difficult to determine, both due to exhibited performance variability, as well as concerns surrounding the validity of lower test scores. While deficits in visuoperceptual functioning are consistent with her history of right temporal lobe encephalomalacia assuming normal lateralization of this right-handed individual, visuoconstructional abilities were intact and relatively strong across testing. Also, despite recent EEG results showing focal slowing over the right frontal/temporal regions, cognitive testing did not suggest pronounced right frontal cognitive dysfunction, with one motor test actually suggesting more left than right frontal dysfunction. There is the potential  that the primary etiology of her cognitive deficits surrounds underlying traits of ADHD, exacerbated by significant psychiatric distress. This could manifest in trouble with processing speed and receptive language (i.e., Ms. Cuccia having difficulties understanding what is being stated to her), worsening her ability to problem solve, adapt to changing cognitive demands, and learn novel information. However, I cannot rule out an underlying cognitive disorder at the present time.   Ms. Grohowski was accompanied by at least one roommate during the current telephone call. Content of the current session focused on the results of her neuropsychological evaluation. Ms. Oas was given the opportunity to ask questions and her questions were answered. She was encouraged to reach out should additional questions arise. A copy of her report was mailed at the conclusion of the visit.      31 minutes were spent conducting the current feedback session with Ms. Lorne Skeens, billed as one unit [phone].

## 2019-09-10 ENCOUNTER — Encounter: Payer: Self-pay | Admitting: Family Medicine

## 2019-09-10 ENCOUNTER — Ambulatory Visit: Payer: Medicaid Other | Admitting: Family Medicine

## 2019-09-10 VITALS — BP 112/78 | HR 87 | Temp 97.5°F | Wt 127.4 lb

## 2019-09-10 DIAGNOSIS — G43109 Migraine with aura, not intractable, without status migrainosus: Secondary | ICD-10-CM | POA: Diagnosis not present

## 2019-09-10 DIAGNOSIS — F341 Dysthymic disorder: Secondary | ICD-10-CM

## 2019-09-10 DIAGNOSIS — K130 Diseases of lips: Secondary | ICD-10-CM | POA: Diagnosis not present

## 2019-09-10 NOTE — Progress Notes (Signed)
   Subjective:    Patient ID: Elizabeth Phelps, female    DOB: 05-16-1956, 64 y.o.   MRN: [id number]  HPI She is here for recheck.  I saw her in December and increased her Paxil to 30 mg.  She states that after roughly a month she returned to her normal psychological self and was very happy with that.  She was seen by Dr. Delice Lesch in early January and placed on gabapentin for treatment of her migraine.  She states that the gabapentin made her quite lethargic causing her to sleep excessively.  She was also being evaluated for cognitive defects and had a study done in March.  That was reviewed with her.   Review of Systems     Objective:   Physical Exam Alert and in no distress .  Exam of the lower left lip does show a 1 cm raised cystic type lesion.     Assessment & Plan:  Dysthymia  Migraine with aura and without status migrainosus, not intractable  Lip lesion - Plan: Ambulatory referral to Oral Maxillofacial Surgery Since she seemed to do quite nicely on the increased dose of Paxil and then had Neurontin added to it, I recommended that she discuss possibly switching to a different migraine prevention medication with Dr. Delice Lesch It also makes me wonder about the validity of the neuropsych evaluation since she was having sedation issues from the gabapentin

## 2019-09-22 ENCOUNTER — Encounter: Payer: Self-pay | Admitting: Neurology

## 2019-09-22 ENCOUNTER — Other Ambulatory Visit: Payer: Self-pay

## 2019-09-22 ENCOUNTER — Ambulatory Visit (INDEPENDENT_AMBULATORY_CARE_PROVIDER_SITE_OTHER): Payer: Medicaid Other | Admitting: Neurology

## 2019-09-22 VITALS — BP 95/60 | HR 74 | Ht 63.0 in | Wt 127.0 lb

## 2019-09-22 DIAGNOSIS — I671 Cerebral aneurysm, nonruptured: Secondary | ICD-10-CM

## 2019-09-22 DIAGNOSIS — G43109 Migraine with aura, not intractable, without status migrainosus: Secondary | ICD-10-CM

## 2019-09-22 DIAGNOSIS — F331 Major depressive disorder, recurrent, moderate: Secondary | ICD-10-CM | POA: Diagnosis not present

## 2019-09-22 DIAGNOSIS — G3184 Mild cognitive impairment, so stated: Secondary | ICD-10-CM

## 2019-09-22 DIAGNOSIS — F411 Generalized anxiety disorder: Secondary | ICD-10-CM

## 2019-09-22 MED ORDER — RIZATRIPTAN BENZOATE 10 MG PO TABS: ORAL_TABLET | ORAL | 11 refills | Status: DC

## 2019-09-22 NOTE — Patient Instructions (Signed)
1. Stop the gabapentin  2. Refer to [facility] for Psychiatry and therapy  3. We will send a prescription for the Maxalt and see if this helps with the migraines. If not helpful, let us know so we can hopefully get Zomig approved

## 2019-09-22 NOTE — Progress Notes (Signed)
NEUROLOGY FOLLOW UP OFFICE NOTE  [name] [id number] 07/28/1955  HISTORY OF PRESENT ILLNESS: I had the pleasure of seeing Elizabeth Phelps in follow-up in the neurology clinic on 09/22/2019.  The patient was last seen 3 months ago for memory loss. MRI/MRA brain in 05/2019 showed no acute changes. There were post-op changes from right pterional craniotomy with underlying chronic post-operative and/or ischemic encephalomalacia in the right temporal lobe and right basal ganglia. There is a Chiari 1 malfomation with tonsils extending up to 44mm through foramen magnum, stable from prior. There is diffuse atrophy and chronic microvascular disease, mildly progressed from 2017. MRA showed combination of fusiform and saccular right MCA aneurysm originating from the right M1 segment, similar to 2017 imaging. She had to stop working and driving due to memory changes. She underwent Neurocognitive testing earlier this month, there was performance variability as well as concerns surrounding validity of lower test scores. Deficits in visuoperceptual functioning are consistent with history of right temporal lobe encephalomalacia. There was no pronounced right frontal cognitive dysfunction seen. There was variability across processing speed, executive functioning, and visuospatial abilities, as well as learning and memory. She scored moderate range for acute symptoms of anxiety and depression. She also scored in the "likely to have ADHD" range across a childhood assessment of inattentive symptoms. It was noted that "There is the potential that the primary etiology of her cognitive deficits surrounds underlying traits of ADHD, exacerbated by significant psychiatric distress. This could manifest in trouble with processing speed and receptive language, worsening her ability to problem solve, adapt to changing cognitive demands, and learn novel information. However, I cannot rule out an underlying cognitive disorder at the  present time."   Since her last visit, she reports that she did not do well with the gabapentin started for headaches on her last visit. She felt it was too much, feeling like she was hungover the next day. She felt like she did not sleep even if she knows she did. She also had the Paxil increased to 30mg  daily around the same time gabapentin was started, but she reduced gabapentin to every other night and noticed she felt great the next day if she did not take the night prior. She feels good this morning. She is still having headaches, last bad migraines was on Monday. She fell after taking Imitrex and says she cannot take it. She takes Zomig with coffee or tea and headaches last 45 minutes. If she takes it without a hot drink, headache lasts 1-1.5 hours. She had 3 headaches in March. She reports a lot of anxiety, she feels that her insides are screaming all the time. She has thoughts going through her sleep but reports sleep is good. She states "I'm just a hot mess." She also has right knee pain. Roommates continue to report that they have to repeat her name 2-3 times or tap the table for her to pop right back into reality. EEG done 06/2019 showed occasional slowing over the right frontal and temporal regions.    History on Initial Assessment 09/08/2018: This is a pleasant 64 year old right-handed woman with a history of hyperlipidemia, tobacco use, anxiety, migraine with aura, right MCA aneurysm s/p craniotomy, presenting for evaluation of worsening memory. She started having memory issues after her brain surgery in 2002, however she continued to function quite well and states she has always been on top of things until around October 2019. At that time, she started noticing word-finding difficulties, [name] has  noticed this as well, she would stop mid-sentence unable to remember a word. She would hear something but would not comprehend what people are saying, asking them to repeat it several times. She feels  like her brain is "short-circuiting." She reports her long-term memory is "awesome" but she cannot remember what she ate yesterday. She has gotten lost driving and uses her GPS all the time even for familiar roads. 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 There were 3 days last week where she was driving to work then she suddenly realizes she is there but does not remember the trip. [name] was talking to her one time while patient was driving then realized she was at work, and did not notice any confusion. 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 She states numbers have always been her thing but now she cannot do math anymore. She lives with 2 roommates who were the first ones to notice how forgetful she has been. She has been told she is "off somewhere" or "zoning out" and would need to be called a couple of times to get her attention. She has noticed she gets frustrated and agitated more easily. She was anxious after her brain surgery in 2002 and was started on Paxil which helped, she does not feel it is helping much now.  She has also started having involuntary left hand shaking lasting a few minutes. Leg is unaffected. There is no associated speech difficulty, confusion, or weakness when she has the shaking. She has numbness and tingling in her finger tips. She has been having more headaches since October, with good response to Excedrin. She has dizziness when she gets up from bed in the morning or in the middle of the night, this is when she usually falls. Last fall was a week ago. She has noticed that if she looks at something sometimes, she sees something different, then looks again and sees what the object actually is. No olfactory/gustatory hallucinations. She has nausea when dizzy,  but also has waves of nausea throughout the day. Sleep is fine most of the time. She has not woken up with tongue bite or incontinence. Her mother had memory issues after several strokes. Her father had a history of cerebral aneurysm as well. She had a head injury in the 1980s. No alcohol use. There is no family history of seizures.   Her last brain imaging was done in January 2017. I personally reviewed images, MRI brain without contrast showed encephalomalacia of the right temporal lobe, right lateral lenticulostriate territory chronic infarct affecting the external capsule, periventricular white matter, patulous right temporal horn. Cerebellar tonsils are 11mm below foramen magnum. MRA head showed combination fusiform and saccular aneurysm of the right MCA throughout M1 segment, appearing similar in size to angiogram in 2002.    PAST MEDICAL HISTORY: Past Medical History:  Diagnosis Date  . Arnold-Chiari malformation, type I   . Atherosclerosis of aorta 10/02/2017  . Brain aneurysm    Combination of fusiform and saccular right MCA aneurysm   . Cerebral aneurysm without rupture   . Dysthymia 04/03/2012  . Generalized anxiety disorder   . History of colonic polyps 08/12/2016  . Hyperlipidemia LDL goal <100 08/12/2016  . Incomplete right bundle branch block  09/22/2017  . Major depressive disorder   . Migraine headache   . Primary biliary cholangitis 08/04/2018  . Primary biliary cirrhosis   . Smoker     MEDICATIONS: Current Outpatient Medications on File Prior to Visit  Medication Sig Dispense Refill  . atorvastatin (LIPITOR) 20 MG tablet Take 1 tablet by mouth once daily 90 tablet 0  . ibuprofen (ADVIL,MOTRIN) 600 MG tablet Take 1 tablet (600 mg total) by mouth every 6 (six) hours as needed. 30 tablet 0  . Multiple Vitamin (MULTIVITAMIN WITH MINERALS) TABS Take 1 tablet by mouth daily.    . OCALIVA 5 MG TABS     . Omega-3 Fatty Acids (FISH OIL PO) Take by mouth daily.    Marland Kitchen PARoxetine  (PAXIL) 30 MG tablet Take 1 tablet (30 mg total) by mouth daily. 30 tablet 1  . zolmitriptan (ZOMIG) 5 MG tablet Take 1 tablet (5 mg total) by mouth as needed for migraine. 10 tablet 11   No current facility-administered medications on file prior to visit.    ALLERGIES: Allergies  Allergen Reactions  . Codeine Nausea And Vomiting    FAMILY HISTORY: Family History  Problem Relation Age of Onset  . Stroke Mother   . Hypertension Mother   . Prostate cancer Father        mets  . Aneurysm Father   . Breast cancer Maternal Grandmother     SOCIAL HISTORY: Social History   Socioeconomic History  . Marital status: Divorced    Spouse name: Not on file  . Number of children: 2  . Years of education: 49  . Highest education level: High school graduate  Occupational History  . Occupation: Unemployed    Comment: Prior Therapist, art  Tobacco Use  . Smoking status: Current Every Day Smoker    Packs/day: 0.75  . Smokeless tobacco: Never Used  Substance and Sexual Activity  . Alcohol use: No  . Drug use: Yes    Types: Marijuana    Comment: Occasionally to help with migraine headaches  . Sexual activity: Not Currently  Other Topics Concern  . Not on file  Social History Narrative   Right handed   Highest level of edu- HS   Lives in one story home with two roommates   Social Determinants of Health   Financial Resource Strain:   . Difficulty of Paying Living Expenses:   Food Insecurity:   . Worried About Charity fundraiser in the Last Year:   . Arboriculturist in the Last Year:   Transportation Needs:   . Film/video editor (Medical):   Marland Kitchen Lack of Transportation (Non-Medical):   Physical Activity:   . Days of Exercise per Week:   . Minutes of Exercise per Session:   Stress:   . Feeling of Stress :   Social Connections:   . Frequency of Communication with Friends and Family:   . Frequency of Social Gatherings with Friends and Family:   . Attends Religious  Services:   . Active Member of Clubs or Organizations:   . Attends Archivist Meetings:   Marland Kitchen Marital Status:   Intimate Partner Violence:   . Fear of Current or Ex-Partner:   . Emotionally Abused:   Marland Kitchen Physically Abused:   . Sexually Abused:     REVIEW OF SYSTEMS: Constitutional: No fevers, chills, or sweats, no generalized fatigue, change in appetite Eyes: No visual changes, double vision, eye pain Ear, nose and throat: No hearing  loss, ear pain, nasal congestion, sore throat Cardiovascular: No chest pain, palpitations Respiratory:  No shortness of breath at rest or with exertion, wheezes GastrointestinaI: No nausea, vomiting, diarrhea, abdominal pain, fecal incontinence Genitourinary:  No dysuria, urinary retention or frequency Musculoskeletal:  No neck pain, back pain Integumentary: No rash, pruritus, skin lesions Neurological: as above Psychiatric: No depression, insomnia, +anxiety Endocrine: No palpitations, fatigue, diaphoresis, mood swings, change in appetite, change in weight, increased thirst Hematologic/Lymphatic:  No anemia, purpura, petechiae. Allergic/Immunologic: no itchy/runny eyes, nasal congestion, recent allergic reactions, rashes  PHYSICAL EXAM: Vitals:   09/22/19 1135  BP: 95/60  Pulse: 74  SpO2: 96%   General: No acute distress Head:  Normocephalic/atraumatic Skin/Extremities: No rash, no edema Neurological Exam: alert and oriented to person, place, and time. No aphasia or dysarthria. Fund of knowledge is appropriate.  Recent and remote memory are impaired.  Attention and concentration are normal. Cranial nerves: Pupils equal, round, reactive to light. Extraocular movements intact with no nystagmus. Visual fields full. No facial asymmetry. Motor: Bulk and tone normal, muscle strength 5/5 throughout with no pronator drift. Finger to nose testing intact.  Gait narrow-based and steady   IMPRESSION: This is a 64 yo RH woman with a history of   hyperlipidemia, tobacco use, anxiety, migraine with aura, right MCA aneurysm s/p craniotomy, who presented with memory changes and headaches. MRI brain no acute changes, there is right temporal encephalomalacia, stable aneurysm. Neurocognitive testing had performance variability as well as concerns surrounding validity. An underlying cognitive disorder could not be ruled out, however the potential primary etiology of her cognitive deficits surround underlying traits of ADHD exacerbated by significant psychiatric distress. She endorses significant anxiety. We discussed referral to Psychiatry and psychotherapy. We agreed to hold off on starting a different headache preventative medication, stop gabapentin. Refills for Maxalt for rescue were sent, she knows to minimize to 2-3 times a week to avoid rebound headaches. If not helpful, she will let us know and we will try to get Zomig approved. Follow-up in 6 months, she knows to call for any changes.   Thank you for allowing me to participate in her care.  Please do not hesitate to call for any questions or concerns.   Ellouise Newer, M.D.   CC:  Dr. Redmond School

## 2019-10-06 ENCOUNTER — Telehealth: Payer: Self-pay

## 2019-10-06 DIAGNOSIS — D100 Benign neoplasm of lip: Secondary | ICD-10-CM | POA: Diagnosis not present

## 2019-10-06 NOTE — Telephone Encounter (Signed)
Done KH 

## 2019-10-06 NOTE — Telephone Encounter (Signed)
A nurse form doctor [facility] office called Oral surgeon office stating that they needed a referral faxed over to be able to see they pt. For a sore in her mouth. There phone number is [phone].

## 2019-10-25 ENCOUNTER — Other Ambulatory Visit: Payer: Self-pay | Admitting: Oral Surgery

## 2019-10-25 DIAGNOSIS — D100 Benign neoplasm of lip: Secondary | ICD-10-CM | POA: Diagnosis not present

## 2019-11-04 ENCOUNTER — Encounter: Payer: Medicaid Other | Admitting: Psychology

## 2019-11-11 ENCOUNTER — Encounter: Payer: Medicaid Other | Admitting: Psychology

## 2019-11-12 ENCOUNTER — Encounter: Payer: Self-pay | Admitting: Gastroenterology

## 2019-11-15 ENCOUNTER — Other Ambulatory Visit: Payer: Self-pay | Admitting: Family Medicine

## 2019-11-15 DIAGNOSIS — F341 Dysthymic disorder: Secondary | ICD-10-CM

## 2019-11-15 NOTE — Telephone Encounter (Signed)
Virtual visit for follow up

## 2019-11-25 ENCOUNTER — Encounter: Payer: Self-pay | Admitting: Family Medicine

## 2019-11-25 ENCOUNTER — Ambulatory Visit: Payer: Medicaid Other | Admitting: Family Medicine

## 2019-11-25 ENCOUNTER — Other Ambulatory Visit: Payer: Self-pay

## 2019-11-25 VITALS — BP 120/76 | HR 84 | Temp 98.0°F | Wt 126.2 lb

## 2019-11-25 DIAGNOSIS — F341 Dysthymic disorder: Secondary | ICD-10-CM | POA: Diagnosis not present

## 2019-11-25 DIAGNOSIS — G43109 Migraine with aura, not intractable, without status migrainosus: Secondary | ICD-10-CM

## 2019-11-25 NOTE — Progress Notes (Signed)
   Subjective:    Patient ID: Elizabeth Phelps, female    DOB: September 05, 1955, 64 y.o.   MRN: [id number]  HPI Is here for consult concerning her dysthymia.  She was recently seen by Dr. Delice Lesch.  That note was reviewed with her.  She was supposed to be referred to behavioral health but has not heard from anyone concerning that.  In the note comments were made concerning cognitive deficits, possible ADHD with anxiety.  Psychiatry/neuropsych evaluation was discussed.  She states that increasing the Paxil to 30 mg has had no real benefit.  Further discussion with her indicates that she does have traits of ADHD.   Review of Systems     Objective:   Physical Exam Alert and in no distress otherwise not examined      Assessment & Plan:  Dysthymia  Migraine with aura and without status migrainosus, not intractable She still has some 30 mg Paxil left and she will finish that.  She is to then call and I will reduce her to 20 mg since the 30 really did not do any good.  Encouraged her to call Dr. Amparo Bristol office to have them follow-up on the referral.  I think she needs a more thorough neuropsych eval before any other medication changes are made.

## 2019-12-10 ENCOUNTER — Other Ambulatory Visit: Payer: Self-pay | Admitting: Nurse Practitioner

## 2019-12-10 ENCOUNTER — Telehealth: Payer: Self-pay | Admitting: Family Medicine

## 2019-12-10 DIAGNOSIS — E785 Hyperlipidemia, unspecified: Secondary | ICD-10-CM

## 2019-12-10 DIAGNOSIS — F341 Dysthymic disorder: Secondary | ICD-10-CM

## 2019-12-10 DIAGNOSIS — K743 Primary biliary cirrhosis: Secondary | ICD-10-CM | POA: Diagnosis not present

## 2019-12-10 DIAGNOSIS — K7469 Other cirrhosis of liver: Secondary | ICD-10-CM | POA: Diagnosis not present

## 2019-12-10 MED ORDER — PAROXETINE HCL 30 MG PO TABS: 30.0000 mg | ORAL_TABLET | Freq: Every day | ORAL | 3 refills | Status: DC

## 2019-12-10 MED ORDER — ATORVASTATIN CALCIUM 20 MG PO TABS: 20.0000 mg | ORAL_TABLET | Freq: Every day | ORAL | 3 refills | Status: DC

## 2019-12-10 NOTE — Telephone Encounter (Signed)
I called it in to [facility]

## 2019-12-10 NOTE — Telephone Encounter (Signed)
Called pt and she actually told me the wrong pharmacy. She said it needs to be sent to the [facility] in [place]. Adam called the [facility] in [place] and cancelled both prescriptions

## 2019-12-10 NOTE — Telephone Encounter (Signed)
Pt needs refill on Lipitor and Paxil sent to the [facility] in [place]

## 2019-12-12 MED ORDER — ATORVASTATIN CALCIUM 20 MG PO TABS: 20.0000 mg | ORAL_TABLET | Freq: Every day | ORAL | 3 refills | Status: DC

## 2019-12-12 MED ORDER — PAROXETINE HCL 30 MG PO TABS: 30.0000 mg | ORAL_TABLET | Freq: Every day | ORAL | 3 refills | Status: DC

## 2019-12-12 NOTE — Addendum Note (Signed)
Addended by: Denita Lung on: 12/12/2019 02:04 PM   Modules accepted: Orders

## 2019-12-12 NOTE — Addendum Note (Signed)
Addended by: Denita Lung on: 12/12/2019 02:02 PM   Modules accepted: Orders

## 2019-12-13 ENCOUNTER — Encounter: Payer: Medicaid Other | Admitting: Family Medicine

## 2019-12-13 DIAGNOSIS — K7460 Unspecified cirrhosis of liver: Secondary | ICD-10-CM | POA: Insufficient documentation

## 2019-12-16 ENCOUNTER — Ambulatory Visit (INDEPENDENT_AMBULATORY_CARE_PROVIDER_SITE_OTHER): Payer: Medicaid Other | Admitting: Clinical

## 2019-12-16 ENCOUNTER — Other Ambulatory Visit: Payer: Self-pay

## 2019-12-16 DIAGNOSIS — F330 Major depressive disorder, recurrent, mild: Secondary | ICD-10-CM | POA: Diagnosis not present

## 2019-12-16 DIAGNOSIS — F064 Anxiety disorder due to known physiological condition: Secondary | ICD-10-CM

## 2019-12-16 DIAGNOSIS — F1210 Cannabis abuse, uncomplicated: Secondary | ICD-10-CM

## 2019-12-18 DIAGNOSIS — F329 Major depressive disorder, single episode, unspecified: Secondary | ICD-10-CM | POA: Insufficient documentation

## 2019-12-18 NOTE — Progress Notes (Signed)
Comprehensive Clinical Assessment (CCA) Note  12/18/2019 Elizabeth Phelps [id number]  Visit Diagnosis:      ICD-10-CM   1. Major depressive disorder, recurrent episode, mild (HCC)  F33.0   2. Anxiety disorder due to medical condition  F06.4   3. Cannabis use disorder, mild, abuse  F12.10        CCA Biopsychosocial  Intake/Chief Complaint:  CCA Intake With Chief Complaint CCA Part Two Date: 12/16/19 CCA Part Two Time: 1500 Chief Complaint/Presenting Problem: Client reported anxiety and frustration brought on by memory loss from a brain aneurism that doctors deemed inoperable in 2005. Patient's Currently Reported Symptoms/Problems: Anxiety, irritability and sadness Individual's Preferences: Client stated, "learning to manage anxiety and anger". Type of Services Patient Feels Are Needed: Therapy  Mental Health Symptoms Depression:  Depression: Hopelessness, Change in energy/activity, Tearfulness, Duration of symptoms greater than two weeks  Mania:  Mania: N/A  Anxiety:   Anxiety: Tension, Irritability, Difficulty concentrating  Psychosis:  Psychosis: None  Trauma:  Trauma: N/A  Obsessions:  Obsessions: N/A  Compulsions:  Compulsions: N/A  Inattention:  Inattention: N/A  Hyperactivity/Impulsivity:  Hyperactivity/Impulsivity: N/A  Oppositional/Defiant Behaviors:  Oppositional/Defiant Behaviors: N/A  Emotional Irregularity:  Emotional Irregularity: N/A  Other Mood/Personality Symptoms:      Mental Status Exam Appearance and self-care  Stature:  Stature: Small  Weight:  Weight: Average weight  Clothing:  Clothing: Casual  Grooming:  Grooming: Normal  Cosmetic use:  Cosmetic Use: Age appropriate  Posture/gait:  Posture/Gait: Normal  Motor activity:  Motor Activity: Not Remarkable  Sensorium  Attention:  Attention: Normal  Concentration:  Concentration: Normal  Orientation:  Orientation: X5  Recall/memory:  Recall/Memory: Normal  Affect and Mood  Affect:  Affect: Congruent   Mood:  Mood: Euthymic  Relating  Eye contact:  Eye Contact: Normal  Facial expression:  Facial Expression: Responsive  Attitude toward examiner:  Attitude Toward Examiner: Cooperative  Thought and Language  Speech flow: Speech Flow: Clear and Coherent  Thought content:  Thought Content: Appropriate to Mood and Circumstances  Preoccupation:  Preoccupations: None  Hallucinations:  Hallucinations: None  Organization:     Transport planner of Knowledge:  Fund of Knowledge: Good  Intelligence:  Intelligence: Average  Abstraction:  Abstraction: Normal  Judgement:  Judgement: Good  Reality Testing:  Reality Testing: Adequate  Insight:  Insight: Good  Decision Making:  Decision Making: Normal  Social Functioning  Social Maturity:  Social Maturity: Irresponsible  Social Judgement:  Social Judgement: Normal  Stress  Stressors:  Stressors: Housing, Psychologist, clinical Ability:  Coping Ability: Normal  Skill Deficits:  Skill Deficits: Communication  Supports:  Supports: Family     Religion: Religion/Spirituality Are You A Religious Person?: No  Leisure/Recreation: Leisure / Recreation Do You Have Hobbies?: Yes  Exercise/Diet: Exercise/Diet Do You Exercise?: No Have You Gained or Lost A Significant Amount of Weight in the Past Six Months?: No Do You Follow a Special Diet?: No Do You Have Any Trouble Sleeping?: Yes   CCA Employment/Education  Employment/Work Situation: Employment / Work Situation Employment situation: 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 What is the longest time patient has a held a job?: 4 years  Education: Education Did Teacher, adult education From [employer]?: Yes   CCA Family/Childhood History  Family and  Relationship History:  Family history Marital status: Divorced What types of issues is patient dealing with in the relationship?: Client reported after she woke up from a coma in 09-24-03 while in the hospital for her brain aneurism she found out her husband of 15 years was cheating on her. Does patient have children?: Yes How many children?: 2 How is patient's relationship with their children?: Client reported she has two boys whom she has a good relationship with. Client reported she is currently living with her youngest son's ex girlfriend.  Childhood History:  Childhood History By whom was/is the patient raised?: Mother Additional childhood history information: Client reported her mother dated a man to have a baby with ,he left, she took care of her as a single mother, she was spoiled rotten. Client reported her mother owned her own business. Client reported her mother let her dad visit at 38 ,when she got older her mother showed her that he signed over his rights. Patient's description of current relationship with people who raised him/her: Client reported she was very close to her mother until she died in 09-24-2010. Does patient have siblings?: No Did patient suffer any verbal/emotional/physical/sexual abuse as a child?: No Did patient suffer from severe childhood neglect?: No Has patient ever been sexually abused/assaulted/raped as an adolescent or adult?: No Was the patient ever a victim of a crime or a disaster?: No Witnessed domestic violence?: No Has patient been affected by domestic violence as an adult?: No  Child/Adolescent Assessment:     CCA Substance Use  Alcohol/Drug Use: Alcohol / Drug Use History of alcohol / drug use?: Yes Substance #1 Name of Substance 1: Marijauana 1 - Frequency: Daily 1 - Last Use / Amount: half a gram/ 12-17-2019                       ASAM's:  Six Dimensions of Multidimensional Assessment  Dimension 1:  Acute Intoxication and/or Withdrawal  Potential:   Dimension 1:  Description of individual's past and current experiences of substance use and withdrawal: Client presented without signs of intoxication with no reported history of substance use treatment.  Dimension 2:  Biomedical Conditions and Complications:   Dimension 2:  Description of patient's biomedical conditions and  complications: Client reported having a brain aneurism that she is recieving ongoing evalaution for.  Dimension 3:  Emotional, Behavioral, or Cognitive Conditions and Complications:  Dimension 3:  Description of emotional, behavioral, or cognitive conditions and complications: Client is dually diagnosed with depression and anxiety.  Dimension 4:  Readiness to Change:  Dimension 4:  Description of Readiness to Change criteria: Client is in the precontemplation stage of change.  Dimension 5:  Relapse, Continued use, or Continued Problem Potential:  Dimension 5:  Relapse, continued use, or continued problem potential critiera description: Client reported daily use.  Dimension 6:  Recovery/Living Environment:  Dimension 6:  Recovery/Iiving environment criteria description: Cilent reported she has family support.  ASAM Severity Score: ASAM's Severity Rating Score: 5  ASAM Recommended Level of Treatment: ASAM Recommended Level of Treatment: Level I Outpatient Treatment   Substance use Disorder (SUD) Substance Use Disorder (SUD)  Checklist Symptoms of Substance Use: Presence of craving or strong urge to use  Recommendations for Services/Supports/Treatments: Recommendations for Services/Supports/Treatments Recommendations For Services/Supports/Treatments: Medication Management, Individual Therapy  DSM5 Diagnoses: Patient Active Problem List   Diagnosis Date Noted  . Major depressive disorder, recurrent episode, mild ([place]) 12/18/2019  . Anxiety disorder due to medical condition 12/18/2019  .  Cirrhosis of liver ([place]) 12/13/2019  . Major depressive disorder   .  Generalized anxiety disorder   . Cerebral aneurysm without rupture   . Primary biliary cholangitis ([place]) 08/04/2018  . Atherosclerosis of aorta ([place]) 10/02/2017  . Incomplete right bundle branch block 09/22/2017  . Arnold-Chiari malformation, type I ([place]) 08/12/2016  . Hyperlipidemia LDL goal <100 08/12/2016  . History of colonic polyps 08/12/2016  . Current smoker 05/25/2013  . Migraine headache 01/08/2013  . Dysthymia 04/03/2012    Patient Centered Plan: Patient is on the following Treatment Plan(s):  Anxiety   Interpretive Summary:  Client is a 64 year old female. Client is presenting via self- referral for a clinical assessment.   Client states mental health symptoms as evidenced by irritability, feel sad, feeling anxious. Client denies suicidal and homicidal ideations at this time. Client denies hallucinations and delusions at this time. Client reported marijuana use.  Client was screened for the following SDOH:    Counselor from 12/16/2019 in [facility]  PHQ-9 Total Score 8     GAD 7 : Generalized Anxiety Score 12/16/2019  Nervous, Anxious, on Edge 3  Control/stop worrying 3  Worry too much - different things 3  Trouble relaxing 3  Restless 3  Easily annoyed or irritable 3  Afraid - awful might happen 1  Total GAD 7 Score 19  Anxiety Difficulty Very difficult     Client meets criteria for MAJOR DEPRESSIVE DISORDER, RECURRENT EPISODE, MILD evidenced by the clients report of feeling sad, diminished interest in doing things, insomnia, loss of energy, and diminished ability to concentrate. Client reported in 2005 she discovered her husband of 15 years had infidelity issues. Client reported at that same time she was recovering from surgery which she discovered doctors were unable to treat her brain aneurism. Client reported changes in her living situation between friends and her children.  Client meets criteria for ANXIETY DISORDER DUE TO  MEDICAL CONDITION evidenced by the clients report of having an operation in 2005 to treat a brain aneurism. Client reported that doctors were unable to treat it. Client reported she believes her anxiety has been triggered by the condition. Client reported in 2018 she noticed she was becoming forgetful while she was driving and short temper. Client reported her mood affected her ability to keep a job. Client meets criteria for cannabis use disorder, mild evidenced by the clients report of using a small bit in bowl daily. Client reported she uses to help alleviate her migraines. Client reported last use on 12/15/19.  Treatment recommendations are individual therapy.  Client was offered psychiatric evaluation but declinded. Client reported she declines substance use counseling. Client was scheduled for next appointment. Clinician provided information on format of appointment (virtual or face to face).  Client was in agreement with treatment recommendations    Referrals to Alternative Service(s): Referred to Alternative Service(s):   Place:   Date:   Time:    Referred to Alternative Service(s):   Place:   Date:   Time:    Referred to Alternative Service(s):   Place:   Date:   Time:    Referred to Alternative Service(s):   Place:   Date:   Time:     Bernestine Amass

## 2019-12-23 ENCOUNTER — Other Ambulatory Visit: Payer: Medicaid Other

## 2019-12-23 DIAGNOSIS — Z419 Encounter for procedure for purposes other than remedying health state, unspecified: Secondary | ICD-10-CM | POA: Diagnosis not present

## 2020-01-04 ENCOUNTER — Ambulatory Visit
Admission: RE | Admit: 2020-01-04 | Discharge: 2020-01-04 | Disposition: A | Discharge status: Home / Self Care | Payer: Medicaid Other | Source: Ambulatory Visit | Attending: Nurse Practitioner | Admitting: Nurse Practitioner

## 2020-01-04 DIAGNOSIS — K7460 Unspecified cirrhosis of liver: Secondary | ICD-10-CM | POA: Diagnosis not present

## 2020-01-04 DIAGNOSIS — K7469 Other cirrhosis of liver: Secondary | ICD-10-CM

## 2020-01-06 ENCOUNTER — Ambulatory Visit (INDEPENDENT_AMBULATORY_CARE_PROVIDER_SITE_OTHER): Payer: Medicaid Other | Admitting: Clinical

## 2020-01-06 ENCOUNTER — Other Ambulatory Visit: Payer: Self-pay

## 2020-01-06 DIAGNOSIS — F330 Major depressive disorder, recurrent, mild: Secondary | ICD-10-CM | POA: Diagnosis not present

## 2020-01-06 NOTE — Progress Notes (Signed)
   THERAPIST PROGRESS NOTE  Session Time: 60 minutes  Participation Level: Active  Behavioral Response: CasualAlertIrritable  Type of Therapy: Individual Therapy  Treatment Goals addressed: Anxiety  Interventions: CBT  Summary:  Elizabeth Phelps is a 64 y.o. female who presents in person for the scheduled session oriented times five, appropriately dressed, and cooperative. Client denied hallucinations and delusions.  Client reported today feeling "irritable". Client reported since the last session she has moved out from living with her sons ex girlfriend that is also the mother of her grandchild. Client reported she has moved in with her son, his wife and their two children. Client reported "I don't know what to do I need my own space". Client reported her son welcomed her to stay for as long as needed but she has been reluctant to move some of her things in the home so she grabs gets clothes from her car as she needs them. Client reported she applied for senior housing through the housing authority and has been placed on the list as housing becomes available.   Client discussed two stressors that are her living situation and how to mediate within herself before brashly speaking. Client reported the source of her abruptly moving out from living with his sons ex is because of something she said. Client reported she did not realize what she said until after the situation was concluded. Client discussed the sequence of situation through to her reaction which upset her grand daughters mother. Client reported trouble with thinking before verbally responding.   Client engaged in the conversation of engaging in a mindfulness exercise called "body scanning". Client engaged in conversation with the therapis about noticing how her body reacts to tension and irritability that occurs before she speaks. Client reported improvement toward finding affordable housing discussing she applied for senior housing  through the housing authority and has been placed on the list as housing becomes available.       Suicidal/Homicidal: Nowithout intent/plan  Therapist Response: Therapist began the session by asking the client how her current mood is. Therapist allowed time to focus on the clients thoughts and feeling that have become since the last session. Therapist continued working on a therapeutic alliance by using active listening, eye contact and empathy. Therapist used CBT to discuss how her current perception of situations and thought processing is supportive of anxiety and irritable thoughts and behaviors. Therapist engaged the client in a discussion about learning about "Body Scanning" which helps with paying close attention to physical sensation throughout the body.  Therapist assigned the client homework to practice body scanning.     Plan: Return again in 3 weeks for individual therapy.  Diagnosis: Major depressive disorder, recurrent episode, mild   Eveline Sauve Y Maximus Hoffert, LCSW 01/06/2020

## 2020-01-18 ENCOUNTER — Ambulatory Visit: Payer: Medicaid Other | Admitting: Gastroenterology

## 2020-01-18 NOTE — Progress Notes (Deleted)
[facility] Progress Note  Chief Complaint: PBC  Subjective  History: From my December 2019 clinic note: "Elizabeth Phelps could not tolerate Actigall side effects, and I am glad she stopped it.  I had hoped to hear from her sooner than this, but I am glad she is doing well at this point.  She still needs long-term management if there is a tolerable medicine.  She understands this will reduce her chance of liver disease decompensation, liver failure, need for transplant and liver cancer.   Advised her to get a flu shot, but she says the last time she did it several years ago she was sick in bed for a week and so she declines.   She does need hepatitis B vaccine series, was agreeable, so first injection given today.     Plan: Labs today for LFTs, CBC, PT/INR, alpha-fetoprotein Right upper quadrant ultrasound soon to screen for hepatoma   I have lab results, I will communicate with the [facility] and ask them to manage the medicine for this PBC.  The only other approved medicine is obetocholic acid, a specialty medicine that I do not yet have experience using.  I would like there consultation and management of this, at least to start.  Further plan to follow." ____________________________  ***  ROS: Cardiovascular:  no chest pain Respiratory: no dyspnea  The patient's Past Medical, Family and Social History were reviewed and are on file in the EMR.  Objective:  Med list reviewed  Current Outpatient Medications:  .  atorvastatin (LIPITOR) 20 MG tablet, Take 1 tablet (20 mg total) by mouth daily., Disp: 90 tablet, Rfl: 3 .  ibuprofen (ADVIL,MOTRIN) 600 MG tablet, Take 1 tablet (600 mg total) by mouth every 6 (six) hours as needed., Disp: 30 tablet, Rfl: 0 .  Multiple Vitamin (MULTIVITAMIN WITH MINERALS) TABS, Take 1 tablet by mouth daily., Disp: , Rfl:  .  OCALIVA 5 MG TABS, , Disp: , Rfl:  .  Omega-3 Fatty Acids (FISH OIL PO), Take by mouth daily., Disp: ,  Rfl:  .  PARoxetine (PAXIL) 30 MG tablet, Take 1 tablet (30 mg total) by mouth daily., Disp: 90 tablet, Rfl: 3 .  rizatriptan (MAXALT) 10 MG tablet, Take 1 tablet as needed at onset of migraine. Do not take more than 2-3 a week, Disp: 10 tablet, Rfl: 11 .  zolmitriptan (ZOMIG) 5 MG tablet, Take 1 tablet (5 mg total) by mouth as needed for migraine. (Patient not taking: Reported on 11/25/2019), Disp: 10 tablet, Rfl: 11   Vital signs in last 24 hrs: There were no vitals filed for this visit.  Physical Exam  ***  HEENT: sclera anicteric, oral mucosa moist without lesions  Neck: supple, no thyromegaly, JVD or lymphadenopathy  Cardiac: RRR without murmurs, S1S2 heard, no peripheral edema  Pulm: clear to auscultation bilaterally, normal RR and effort noted  Abdomen: soft, *** tenderness, with active bowel sounds. No guarding or palpable hepatosplenomegaly.  Skin; warm and dry, no jaundice or rash  Labs:  CBC Latest Ref Rng & Units 06/10/2019 06/11/2018 12/30/2017  WBC 3.4 - 10.8 x10E3/uL 8.1 6.9 6.4  Hemoglobin 11.1 - 15.9 g/dL 13.9 14.9 14.3  Hematocrit 34.0 - 46.6 % 42.3 44.6 43.3  Platelets 150 - 450 x10E3/uL 186 204.0 196   CMP Latest Ref Rng & Units 06/10/2019 06/11/2018 04/02/2018  Glucose 65 - 99 mg/dL 84 94 -  BUN 8 - 27 mg/dL 11 8 -  Creatinine 0.57 -  1.00 mg/dL 0.79 0.56 -  Sodium 134 - 144 mmol/L 140 140 -  Potassium 3.5 - 5.2 mmol/L 4.6 4.3 -  Chloride 96 - 106 mmol/L 106 107 -  CO2 20 - 29 mmol/L 24 26 -  Calcium 8.7 - 10.3 mg/dL 9.4 9.4 -  Total Protein 6.0 - 8.5 g/dL 7.5 7.2 7.6  Total Bilirubin 0.0 - 1.2 mg/dL 0.5 0.6 0.7  Alkaline Phos 39 - 117 IU/L 120(H) 96 121(H)  AST 0 - 40 IU/L 65(H) 61(H) 128(H)  ALT 0 - 32 IU/L 35(H) 36(H) 86(H)    ___________________________________________ Radiologic studies:   ____________________________________________ Other:   _____________________________________________ Assessment & Plan  Assessment: No diagnosis  found.    Plan:    *** minutes were spent on this encounter (including chart review, history/exam, counseling/coordination of care, and documentation)  Nelida Meuse III

## 2020-01-23 DIAGNOSIS — Z419 Encounter for procedure for purposes other than remedying health state, unspecified: Secondary | ICD-10-CM | POA: Diagnosis not present

## 2020-01-26 DIAGNOSIS — K743 Primary biliary cirrhosis: Secondary | ICD-10-CM | POA: Diagnosis not present

## 2020-01-26 DIAGNOSIS — K754 Autoimmune hepatitis: Secondary | ICD-10-CM | POA: Diagnosis not present

## 2020-01-31 ENCOUNTER — Other Ambulatory Visit: Payer: Self-pay

## 2020-01-31 ENCOUNTER — Ambulatory Visit (INDEPENDENT_AMBULATORY_CARE_PROVIDER_SITE_OTHER): Payer: Medicaid Other | Admitting: Clinical

## 2020-01-31 DIAGNOSIS — F411 Generalized anxiety disorder: Secondary | ICD-10-CM

## 2020-01-31 NOTE — Progress Notes (Signed)
   THERAPIST PROGRESS NOTE   Session Time: 60 minutes  Participation Level: Active  Behavioral Response: CasualAlertAnxious  Type of Therapy: Individual Therapy  Treatment Goals addressed: Anger and Anxiety  Interventions: CBT  Summary:  Elizabeth Phelps is a 64 y.o. female who presents in person for the scheduled session. Client presented oriented times five, appropriately dressed, and friendly. Client denied hallucinations and delusions.  Client reported on today she has been on edge. Client reported she has not eaten in the past two days. Client reported she has contacted another senior apartment complex to be put on the wait list. Client reported her living with her son and his family has not improved but digressed. Client reported she began to journal her thoughts and feelings. Client reported she has had thoughts of shame and guilt thinking about her mother and how things have came to the point where they are now. Client reported regression of her progress due to her living conditions being hectic with her watching her grandchildren and not having space to think or to be with herself. Client reported she is worried about her health due to the stress from her living situation. Client reported she is worried about er brain aneurism and her migraines triggered by stress. Client stated, "I'm scared I'm going to die from my health conditions". Client reported when she is away from home going to a doctor visit or for therapy but when she goes home "everything goes out the window".  Client reported currently she is working on reapplying for disability. Client reported she has plans to go to the housing authority office to check on the status of her information. Client also reported she has tried to pay attention to her body reactions as discussed in the previous session to monitor her stress levels and/ or before she reacts in certain situations.  Client reported she will continue to work on  relaxation techniques and healthy coping practices.   Suicidal/Homicidal: Nowithout intent/plan  Therapist Response:  Therapist began the session by discussing with the client updates since the last session. Therapist allowed time to focus on the clients thoughts and feelings. Therapist collaborated with the client to discuss the effectiveness of used coping skills and encouraged it's use. Therapist assisted with giving the client resources for other income driven housing placements. Therapist assisted with scheduling the client for next appointments.      Plan: Return again in 4 weeks for individual therapy.  Diagnosis: Generalized anxiety disorder    Birdena Jubilee Vola Beneke, LCSW 01/31/2020

## 2020-02-10 ENCOUNTER — Telehealth (INDEPENDENT_AMBULATORY_CARE_PROVIDER_SITE_OTHER): Payer: Medicaid Other | Admitting: Family Medicine

## 2020-02-10 ENCOUNTER — Telehealth: Payer: Self-pay | Admitting: Family Medicine

## 2020-02-10 DIAGNOSIS — T380X5A Adverse effect of glucocorticoids and synthetic analogues, initial encounter: Secondary | ICD-10-CM

## 2020-02-10 DIAGNOSIS — R4182 Altered mental status, unspecified: Secondary | ICD-10-CM | POA: Diagnosis not present

## 2020-02-10 DIAGNOSIS — R4189 Other symptoms and signs involving cognitive functions and awareness: Secondary | ICD-10-CM

## 2020-02-10 MED ORDER — ALPRAZOLAM 0.25 MG PO TABS: 0.2500 mg | ORAL_TABLET | Freq: Every day | ORAL | 0 refills | Status: DC

## 2020-02-10 NOTE — Progress Notes (Signed)
   Subjective:    Patient ID: Elizabeth Phelps, female    DOB: 1956/05/30, 64 y.o.   MRN: [id number]  HPI I connected with  Elizabeth Phelps on 02/10/20 by a video enabled telemedicine application and verified that I am speaking with the correct person using two identifiers. I discussed the limitations of evaluation and management by telemedicine. The patient expressed understanding and agreed to proceed.  Encounter made to phone while she was driving. Apparently she was recently diagnosed as having an autoimmune hepatitis.  She was on 30 mg of prednisone and about a week ago dropped down to 20 mg.  She states that her main trouble is difficulty with sleep but she also states that her general level is anxiety has actually gone up.  She plans to be seen by the specialist again in another month.  She apparently has tried Benadryl to help with sleep as well as Xanax and finds Xanax to be much more useful.    Review of Systems     Objective:   Physical Exam Alert and's does not sound like she is in any stress.       Assessment & Plan:  Steroid euphoria - Plan: ALPRAZolam (XANAX) 0.25 MG tablet I explained that I thought her present symptoms are more steroid related and hopefully when the dose gets tapered even further in 1 month, she will no longer need the Xanax.  She was comfortable with that.  She will keep me informed as to how she was doing and I also asked her to have the specialist send me some notes.

## 2020-02-10 NOTE — Telephone Encounter (Signed)
Lets do a virtual visit

## 2020-02-10 NOTE — Telephone Encounter (Signed)
Virtual appt made for today. [place]

## 2020-02-10 NOTE — Telephone Encounter (Signed)
Pt called and is requesting a RX was xanax states is having a hard time sleeping, pt has a wellness appt 03/24/2020. Pt would like u to call her about it I tried to make pt a virtual appt and she did not want to do that,   She also wants to know the den sit that u recommend her to   Pt can be reached at [phone]

## 2020-02-23 DIAGNOSIS — Z419 Encounter for procedure for purposes other than remedying health state, unspecified: Secondary | ICD-10-CM | POA: Diagnosis not present

## 2020-03-07 DIAGNOSIS — K754 Autoimmune hepatitis: Secondary | ICD-10-CM | POA: Diagnosis not present

## 2020-03-08 ENCOUNTER — Telehealth: Payer: Self-pay | Admitting: Family Medicine

## 2020-03-08 DIAGNOSIS — R4182 Altered mental status, unspecified: Secondary | ICD-10-CM

## 2020-03-08 DIAGNOSIS — T380X5A Adverse effect of glucocorticoids and synthetic analogues, initial encounter: Secondary | ICD-10-CM

## 2020-03-08 MED ORDER — ALPRAZOLAM 0.25 MG PO TABS: 0.2500 mg | ORAL_TABLET | Freq: Every day | ORAL | 0 refills | Status: DC

## 2020-03-08 NOTE — Telephone Encounter (Signed)
Pt called and requesting a refill on her xanax states she is still taking the prednisone and it is still keeping her up, the xanax is the only thing that helps her sleep pt would like it sent to the [facility] ([facility] [place] - [address]  Pt can be reached at [phone]  informed pt that you was out of the office

## 2020-03-09 ENCOUNTER — Encounter: Payer: Medicaid Other | Admitting: Family Medicine

## 2020-03-10 ENCOUNTER — Telehealth: Payer: Self-pay

## 2020-03-10 DIAGNOSIS — T380X5A Adverse effect of glucocorticoids and synthetic analogues, initial encounter: Secondary | ICD-10-CM

## 2020-03-10 DIAGNOSIS — R4182 Altered mental status, unspecified: Secondary | ICD-10-CM

## 2020-03-10 MED ORDER — ALPRAZOLAM 0.25 MG PO TABS: 0.2500 mg | ORAL_TABLET | Freq: Every day | ORAL | 0 refills | Status: DC

## 2020-03-10 NOTE — Telephone Encounter (Signed)
Pt. Called back stating that you accidentally sent her xanax to the wrong pharmacy it got sent to one in [place] and it needed to go to the [facility] on [address] in [place]. She tried to get it transferred her self but since it is a controlled substance needs to be oked by the MD to be transferred. If you could send in a new script for xanax to the [facility] on [address].

## 2020-03-14 ENCOUNTER — Ambulatory Visit (HOSPITAL_COMMUNITY): Payer: Medicaid Other | Admitting: Clinical

## 2020-03-16 ENCOUNTER — Ambulatory Visit: Payer: Medicaid Other | Admitting: Gastroenterology

## 2020-03-24 ENCOUNTER — Ambulatory Visit (INDEPENDENT_AMBULATORY_CARE_PROVIDER_SITE_OTHER): Payer: Medicaid Other | Admitting: Family Medicine

## 2020-03-24 ENCOUNTER — Other Ambulatory Visit: Payer: Self-pay

## 2020-03-24 ENCOUNTER — Encounter: Payer: Self-pay | Admitting: Family Medicine

## 2020-03-24 VITALS — BP 110/74 | HR 77 | Temp 98.0°F | Ht 63.0 in | Wt 129.2 lb

## 2020-03-24 DIAGNOSIS — M79672 Pain in left foot: Secondary | ICD-10-CM | POA: Diagnosis not present

## 2020-03-24 DIAGNOSIS — Z0000 Encounter for general adult medical examination without abnormal findings: Secondary | ICD-10-CM

## 2020-03-24 DIAGNOSIS — E785 Hyperlipidemia, unspecified: Secondary | ICD-10-CM | POA: Diagnosis not present

## 2020-03-24 DIAGNOSIS — I700 Atherosclerosis of aorta: Secondary | ICD-10-CM

## 2020-03-24 DIAGNOSIS — Z419 Encounter for procedure for purposes other than remedying health state, unspecified: Secondary | ICD-10-CM | POA: Diagnosis not present

## 2020-03-24 DIAGNOSIS — K743 Primary biliary cirrhosis: Secondary | ICD-10-CM

## 2020-03-24 DIAGNOSIS — F17200 Nicotine dependence, unspecified, uncomplicated: Secondary | ICD-10-CM

## 2020-03-24 DIAGNOSIS — G935 Compression of brain: Secondary | ICD-10-CM | POA: Diagnosis not present

## 2020-03-24 DIAGNOSIS — I671 Cerebral aneurysm, nonruptured: Secondary | ICD-10-CM | POA: Diagnosis not present

## 2020-03-24 DIAGNOSIS — Z86010 Personal history of colonic polyps: Secondary | ICD-10-CM | POA: Diagnosis not present

## 2020-03-24 DIAGNOSIS — F330 Major depressive disorder, recurrent, mild: Secondary | ICD-10-CM | POA: Diagnosis not present

## 2020-03-24 DIAGNOSIS — G43109 Migraine with aura, not intractable, without status migrainosus: Secondary | ICD-10-CM

## 2020-03-24 DIAGNOSIS — F1210 Cannabis abuse, uncomplicated: Secondary | ICD-10-CM

## 2020-03-24 MED ORDER — ZOLMITRIPTAN 5 MG PO TABS: 5.0000 mg | ORAL_TABLET | ORAL | 11 refills | Status: DC | PRN

## 2020-03-24 NOTE — Progress Notes (Signed)
   Subjective:    Patient ID: Elizabeth Phelps, female    DOB: [birth year], 64 y.o.   MRN: [id number]  HPI She is here for complete examination.  She does have a complicated history with most recent difficulty revolving around primary biliary cholangitis and possible autoimmune hepatitis.  Presently she is taking Imuran.  She is being followed by gastroenterology for that.  Presently she is on steroids to help with this.  She does have history of cerebral aneurysm as well as Arnold-Chiari malformation.  She is seeing psychiatry and seems to be stable on her underlying depression although she still does smoke marijuana.  She continues on Paxil which seems to be helping this.  She also has a history of migraine headache and in the past has tried Maxalt and Imitrex without good results.  She did get some benefit from Zomig.  She plans to set up for mammogram and Pap smear in the near future.  She also has a history of atherosclerosis and presently is taking Lipitor.  She does have history of colonic polyps but apparently does not need follow-up colonoscopy for 10 years.  She is now in a stable home environment which is really helping her.  She continues to smoke and is not interested in quitting.  She injured her right foot approximately 3 weeks ago and is still having pain at the head of the fifth metatarsal.   Review of Systems  All other systems reviewed and are negative.      Objective:   Physical Exam Alert and in no distress. Tympanic membranes and canals are normal. Pharyngeal area is normal. Neck is supple without adenopathy or thyromegaly. Cardiac exam shows a regular sinus rhythm without murmurs or gallops. Lungs are clear to auscultation. Exam of the right foot does show tenderness palpation over the metatarsal head but no swelling or deformity noted.  Full motion of the ankle.  Normal strength.      Assessment & Plan:  Routine general medical examination at a health care facility - Plan:  CBC with Differential/Platelet, Comprehensive metabolic panel, Lipid panel  Hyperlipidemia LDL goal <100  Migraine with aura and without status migrainosus, not intractable - Plan: zolmitriptan (ZOMIG) 5 MG tablet  Primary biliary cholangitis (HCC) - Plan: CBC with Differential/Platelet, Comprehensive metabolic panel  Atherosclerosis of aorta (HCC) - Plan: Lipid panel  Major depressive disorder, recurrent episode, mild (HCC)  Cerebral aneurysm without rupture  History of colonic polyps  Current smoker  Left foot pain  Cannabis use disorder, mild, abuse  Arnold-Chiari malformation, type I ([place]) Encouraged her to quit all forms of smoke.  No intervention or further care for the aneurysm on the Arnold-Chiari.  She will continue on her Paxil and follow-up with psychiatry.  Continue on Lipitor.  No immunizations were given because she is now on prednisone to help with cholangitis.  If she continues to have foot pain for the next several weeks, she is to call for possible x-ray and further intervention.  Follow-up on the colonic polyps as per protocol. We will try to get Zomig covered since it has worked in the past.

## 2020-03-25 LAB — CBC WITH DIFFERENTIAL/PLATELET
Basophils Absolute: 0.1 10*3/uL (ref 0.0–0.2)
Basos: 1 %
EOS (ABSOLUTE): 0.1 10*3/uL (ref 0.0–0.4)
Eos: 1 %
Hematocrit: 41.4 % (ref 34.0–46.6)
Hemoglobin: 14.2 g/dL (ref 11.1–15.9)
Immature Grans (Abs): 0.1 10*3/uL (ref 0.0–0.1)
Immature Granulocytes: 1 %
Lymphocytes Absolute: 2.8 10*3/uL (ref 0.7–3.1)
Lymphs: 27 %
MCH: 28.9 pg (ref 26.6–33.0)
MCHC: 34.3 g/dL (ref 31.5–35.7)
MCV: 84 fL (ref 79–97)
Monocytes Absolute: 1.1 10*3/uL — ABNORMAL HIGH (ref 0.1–0.9)
Monocytes: 10 %
Neutrophils Absolute: 6.3 10*3/uL (ref 1.4–7.0)
Neutrophils: 60 %
Platelets: 184 10*3/uL (ref 150–450)
RBC: 4.92 x10E6/uL (ref 3.77–5.28)
RDW: 13.1 % (ref 11.7–15.4)
WBC: 10.4 10*3/uL (ref 3.4–10.8)

## 2020-03-25 LAB — LIPID PANEL
Chol/HDL Ratio: 3.1 ratio (ref 0.0–4.4)
Cholesterol, Total: 211 mg/dL — ABNORMAL HIGH (ref 100–199)
HDL: 69 mg/dL (ref >39)
LDL Chol Calc (NIH): 121 mg/dL — ABNORMAL HIGH (ref 0–99)
Triglycerides: 117 mg/dL (ref 0–149)
VLDL Cholesterol Cal: 21 mg/dL (ref 5–40)

## 2020-03-25 LAB — COMPREHENSIVE METABOLIC PANEL
ALT: 22 IU/L (ref 0–32)
AST: 35 IU/L (ref 0–40)
Albumin/Globulin Ratio: 1.6 (ref 1.2–2.2)
Albumin: 4.4 g/dL (ref 3.8–4.8)
Alkaline Phosphatase: 89 IU/L (ref 44–121)
BUN/Creatinine Ratio: 16 (ref 12–28)
BUN: 12 mg/dL (ref 8–27)
Bilirubin Total: 0.4 mg/dL (ref 0.0–1.2)
CO2: 26 mmol/L (ref 20–29)
Calcium: 9.3 mg/dL (ref 8.7–10.3)
Chloride: 107 mmol/L — ABNORMAL HIGH (ref 96–106)
Creatinine, Ser: 0.76 mg/dL (ref 0.57–1.00)
GFR calc Af Amer: 96 mL/min/{1.73_m2} (ref >59)
GFR calc non Af Amer: 83 mL/min/{1.73_m2} (ref >59)
Globulin, Total: 2.8 g/dL (ref 1.5–4.5)
Glucose: 73 mg/dL (ref 65–99)
Potassium: 4 mmol/L (ref 3.5–5.2)
Sodium: 142 mmol/L (ref 134–144)
Total Protein: 7.2 g/dL (ref 6.0–8.5)

## 2020-03-28 ENCOUNTER — Ambulatory Visit (INDEPENDENT_AMBULATORY_CARE_PROVIDER_SITE_OTHER): Payer: Medicaid Other | Admitting: Clinical

## 2020-03-28 ENCOUNTER — Other Ambulatory Visit: Payer: Self-pay

## 2020-03-28 DIAGNOSIS — F411 Generalized anxiety disorder: Secondary | ICD-10-CM | POA: Diagnosis not present

## 2020-03-29 NOTE — Progress Notes (Signed)
   THERAPIST PROGRESS NOTE  Session Time: 35 minutes  Participation Level: Active  Behavioral Response: CasualAlertEuthymic  Type of Therapy: Individual Therapy  Treatment Goals addressed: Anxiety  Interventions: CBT  Summary:  Elizabeth Phelps is a 64 y.o. female who presents oriented times five, appropriately dressed, and friendly. Client denied hallucinations and delusions. Client reported on today doing much better. Client reported since the last session she has moved into [place]. Client reported she is excited and her mood has improved tremendously with having a place of her own. Client reported over the last two months she feel and broke the side of her foot while she was living with her son. Client reported before she moved out of her house she was able to get her grandsons on a routine to give her some quiet to get to bed at a decent time. Client reported she had her doctor to write her a low dosage of Xanax to help calm her nerves. Client reported it ws effective for the time being but no longer needs. Client reported her children are happy that she has her own home. Client reported as she is sorting through things she has come across some of her mothers things causing her to reflect. Client recalled the time she was in a coma in the hospital and had a dream of a pink angel before she woke up. Client reported her purpose is to be present to care for her children. Client stated, "I feel the old Lailynn coming back". Client reported she feels more at ease and is looking forward to be more social and have her family over for the holidays.      Suicidal/Homicidal: Nowithout intent/plan  Therapist Response:  Therapist began the session by checking in and asking the client how she has been doing since the last session. Therapist actively listened to the clients thoughts and feelings.  Therapist asked open ended questions to assess the clients symptoms. Therapist engaged the client in  a discussion about her noticeable difference of her mood and behaviors. Therapist assisted scheduling next appointments.   Plan: Return again in 3 weeks for individual therapy.  Diagnosis: Generalized anxiety disorder   Birdena Jubilee Camryn Quesinberry, LCSW 03/29/2020

## 2020-04-04 ENCOUNTER — Ambulatory Visit (INDEPENDENT_AMBULATORY_CARE_PROVIDER_SITE_OTHER): Payer: Medicaid Other | Admitting: Neurology

## 2020-04-04 ENCOUNTER — Encounter: Payer: Self-pay | Admitting: Neurology

## 2020-04-04 ENCOUNTER — Other Ambulatory Visit: Payer: Self-pay

## 2020-04-04 VITALS — BP 113/75 | HR 74 | Resp 18 | Ht 63.0 in | Wt 132.0 lb

## 2020-04-04 DIAGNOSIS — G3184 Mild cognitive impairment, so stated: Secondary | ICD-10-CM

## 2020-04-04 DIAGNOSIS — I671 Cerebral aneurysm, nonruptured: Secondary | ICD-10-CM

## 2020-04-04 DIAGNOSIS — G43109 Migraine with aura, not intractable, without status migrainosus: Secondary | ICD-10-CM

## 2020-04-04 NOTE — Progress Notes (Signed)
NEUROLOGY FOLLOW UP OFFICE NOTE  Elizabeth Phelps [id number] Nov 08, 1955  HISTORY OF PRESENT ILLNESS: I had the pleasure of seeing Elizabeth Phelps in follow-up in the neurology clinic on 04/04/2020.  The patient was last seen 7 months ago for memory loss. She is alone in the office today.  Records and images were personally reviewed where available.  Since her last visit, she has been diagnosed with autoimmune hepatitis. She has started azathioprine and has been on a tapering schedule of Prednisone the past 2 months, stopping it this week. She had several issues with the prednisone, including insomnia and increased migraines. She took Xanax to sleep for a time, but has only needed it sparingly since reducing prednisone dose. She was having 3-4 migraines a week, but so far this week she has not had any migraines. She has prn Zomig with good effect. Maxalt helped but made her drowsy and she needed to take 2 doses to help. She continues to endorse a lot of stress. She became homeless, things did not work out with roommates, until she finally found an apartment at a [employer]. She has been living alone for the past month and mentally she is a lot better. She keeps repeating "I'm a hot mess" because the stress is still there, she is now responsible for remembering her bills and in control of her own money, she she is "freaking out." She has calendars and her memory notebook but still forgets things. She does not drive. She has a pillbox and does not forget medications regularly, around 2-3 times a month. She has been to our office several times but has gone to the wrong building twice. Neuropsychological testing indicated possible ADHD and significant psychiatric distress as likely causes of cognitive deficits, she continues to work with [employer] and feels this has helped a lot. She states "this month will be the test." She hurt her foot a month ago from a fall and continues to have right  foot pain.     History on Initial Assessment 09/08/2018: This is a pleasant 64 year old right-handed woman with a history of hyperlipidemia, tobacco use, anxiety, migraine with aura, right MCA aneurysm s/p craniotomy, presenting for evaluation of worsening memory. She started having memory issues after her brain surgery in 2002, however she continued to function quite well and states she has always been on top of things until around October 2019. At that time, she started noticing word-finding difficulties, [name] has noticed this as well, she would stop mid-sentence unable to remember a word. She would hear something but would not comprehend what people are saying, asking them to repeat it several times. She feels like her brain is "short-circuiting." She reports her long-term memory is "awesome" but she cannot remember what she ate yesterday. She has gotten lost driving and uses her GPS all the time even for familiar roads. 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 There were 3 days last week where she was driving to work then she suddenly realizes she is there but does not remember the trip. [name] was talking to her one time while patient was driving then realized she was at work, and did not notice any confusion. 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 She states numbers have always been her thing but now she cannot do math anymore. She lives with 2 roommates who were the first ones to notice how forgetful she has been. She has been told she is "off somewhere" or "zoning out" and would need to be called a couple of times to get her attention. She has noticed she gets frustrated and agitated more easily. She was anxious after her brain surgery in 2002 and was started on Paxil which helped,  she does not feel it is helping much now.  She has also started having involuntary left hand shaking lasting a few minutes. Leg is unaffected. There is no associated speech difficulty, confusion, or weakness when she has the shaking. She has numbness and tingling in her finger tips. She has been having more headaches since October, with good response to Excedrin. She has dizziness when she gets up from bed in the morning or in the middle of the night, this is when she usually falls. Last fall was a week ago. She has noticed that if she looks at something sometimes, she sees something different, then looks again and sees what the object actually is. No olfactory/gustatory hallucinations. She has nausea when dizzy, but also has waves of nausea throughout the day. Sleep is fine most of the time. She has not woken up with tongue bite or incontinence. Her mother had memory issues after several strokes. Her father had a history of cerebral aneurysm as well. She had a head injury in the 1980s. No alcohol use. There is no family history of seizures.   MRI brain without contrast in January 2017 showed encephalomalacia of the right temporal lobe, right lateral lenticulostriate territory chronic infarct affecting the external capsule, periventricular white matter, patulous right temporal horn. Cerebellar tonsils are 70mm below foramen magnum. MRA head showed combination fusiform and saccular aneurysm of the right MCA throughout M1 segment, appearing similar in size to angiogram in 2002.   Repeat MRI/MRA brain in 05/2019 showed no acute changes. There were post-op changes from right pterional craniotomy with underlying chronic post-operative and/or ischemic encephalomalacia in the right temporal lobe and right basal ganglia. There is a Chiari 1 malfomation with tonsils extending up to 7mm through foramen magnum, stable from prior. There is diffuse atrophy and chronic microvascular disease, mildly progressed from 2017.  MRA showed combination of fusiform and saccular right MCA aneurysm originating from the right M1 segment, similar to 2017 imaging.   Neurocognitive testing was done in March 2021. There was performance variability as well as concerns surrounding validity of lower test scores. Deficits in visuoperceptual functioning are consistent with history of right temporal lobe encephalomalacia. There was no pronounced right frontal cognitive dysfunction seen. There was variability across processing speed, executive functioning, and visuospatial abilities, as well as learning and memory. She scored moderate range for acute symptoms of anxiety and depression. She also scored in the "likely to have ADHD" range across a childhood assessment of inattentive symptoms. It was noted that "There is the potential that the primary etiology of her cognitive deficits surrounds underlying traits of ADHD, exacerbated by significant psychiatric distress. This could manifest in trouble with processing speed and receptive language, worsening her ability to problem solve, adapt to changing cognitive demands, and learn novel information. However, I cannot rule out an underlying cognitive disorder at the present time."   EEG done 06/2019 showed occasional slowing over the right frontal and temporal regions.   Prior headache preventative medication: gabapentin Prior headache  rescue medication: imitrex, zomig, Maxalt (needed 2 doses, drowsiness)   PAST MEDICAL HISTORY: Past Medical History:  Diagnosis Date  . Arnold-Chiari malformation, type I   . Atherosclerosis of aorta 10/02/2017  . Brain aneurysm    Combination of fusiform and saccular right MCA aneurysm   . Cerebral aneurysm without rupture   . Dysthymia 04/03/2012  . Generalized anxiety disorder   . History of colonic polyps 08/12/2016  . Hyperlipidemia LDL goal <100 08/12/2016  . Incomplete right bundle branch block 09/22/2017  . Major depressive disorder   . Migraine  headache   . Primary biliary cholangitis 08/04/2018  . Primary biliary cirrhosis   . Smoker     MEDICATIONS: Current Outpatient Medications on File Prior to Visit  Medication Sig Dispense Refill  . ALPRAZolam (XANAX) 0.25 MG tablet Take 1 tablet (0.25 mg total) by mouth at bedtime. 30 tablet 0  . atorvastatin (LIPITOR) 20 MG tablet Take 1 tablet (20 mg total) by mouth daily. 90 tablet 3  . azaTHIOprine (IMURAN) 50 MG tablet Take 50 mg by mouth daily.    Marland Kitchen ibuprofen (ADVIL,MOTRIN) 600 MG tablet Take 1 tablet (600 mg total) by mouth every 6 (six) hours as needed. 30 tablet 0  . Multiple Vitamin (MULTIVITAMIN WITH MINERALS) TABS Take 1 tablet by mouth daily.    . OCALIVA 5 MG TABS     . Omega-3 Fatty Acids (FISH OIL PO) Take by mouth daily.    Marland Kitchen PARoxetine (PAXIL) 30 MG tablet Take 1 tablet (30 mg total) by mouth daily. 90 tablet 3  . predniSONE (DELTASONE) 10 MG tablet Take 20 mg by mouth daily.    Marland Kitchen zolmitriptan (ZOMIG) 5 MG tablet Take 1 tablet (5 mg total) by mouth as needed for migraine. 10 tablet 11   No current facility-administered medications on file prior to visit.    ALLERGIES: Allergies  Allergen Reactions  . Codeine Nausea And Vomiting    FAMILY HISTORY: Family History  Problem Relation Age of Onset  . Stroke Mother   . Hypertension Mother   . Prostate cancer Father        mets  . Aneurysm Father   . Breast cancer Maternal Grandmother     SOCIAL HISTORY: Social History   Socioeconomic History  . Marital status: Divorced    Spouse name: Not on file  . Number of children: 2  . Years of education: 30  . Highest education level: High school graduate  Occupational History  . Occupation: Unemployed    Comment: Prior Therapist, art  Tobacco Use  . Smoking status: Current Every Day Smoker    Packs/day: 0.75  . Smokeless tobacco: Never Used  Vaping Use  . Vaping Use: Never used  Substance and Sexual Activity  . Alcohol use: No  . Drug use: Yes    Types:  Marijuana    Comment: Occasionally to help with migraine headaches  . Sexual activity: Not Currently  Other Topics Concern  . Not on file  Social History Narrative   Right handed   Highest level of edu- HS   Lives in one story home with two roommates   Social Determinants of Health   Financial Resource Strain:   . Difficulty of Paying Living Expenses: Not on file  Food Insecurity:   . Worried About Charity fundraiser in the Last Year: Not on file  . Ran Out of Food in the Last Year: Not on file  Transportation Needs:   . Lack  of Transportation (Medical): Not on file  . Lack of Transportation (Non-Medical): Not on file  Physical Activity:   . Days of Exercise per Week: Not on file  . Minutes of Exercise per Session: Not on file  Stress:   . Feeling of Stress : Not on file  Social Connections:   . Frequency of Communication with Friends and Family: Not on file  . Frequency of Social Gatherings with Friends and Family: Not on file  . Attends Religious Services: Not on file  . Active Member of Clubs or Organizations: Not on file  . Attends Archivist Meetings: Not on file  . Marital Status: Not on file  Intimate Partner Violence:   . Fear of Current or Ex-Partner: Not on file  . Emotionally Abused: Not on file  . Physically Abused: Not on file  . Sexually Abused: Not on file     PHYSICAL EXAM: Vitals:   04/04/20 1135  BP: 113/75  Pulse: 74  Resp: 18  SpO2: 98%   General: No acute distress, anxious Head:  Normocephalic/atraumatic Skin/Extremities: No rash, no edema Neurological Exam: awake and alert. No aphasia or dysarthria. Fund of knowledge is appropriate.  Recent and remote memory are impaired. Attention and concentration are normal. Cranial nerves: Pupils equal, round. Extraocular movements intact. Motor: moves all extremities symmetrically. Gait narrow-based, no ataxia   IMPRESSION: This is a 64 yo RH woman with a history of  hyperlipidemia, tobacco  use, anxiety, migraine with aura, right MCA aneurysm s/p craniotomy, who presented with memory changes and headaches. MRI brain no acute changes, there is right temporal encephalomalacia, stable aneurysm. Follow-up MRA head without contrast will be ordered for December 2021. She feels headaches may improve once she is off prednisone, continue to monitor. She has had good response to prn Zomig. She continues to report memory issues, Neurocognitive testing had performance variability as well as concerns surrounding validity. An underlying cognitive disorder could not be ruled out, however the potential primary etiology of her cognitive deficits surround underlying traits of ADHD exacerbated by significant psychiatric distress. She is now working with [employer]. Repeat Neuropsychological evaluation will be ordered for March 2022 as she continues to work on anxiety and Child psychotherapist. Follow-up after Neuropsych testing, she knows to call for any changes.    Thank you for allowing me to participate in her care.  Please do not hesitate to call for any questions or concerns.   Ellouise Newer, M.D.   CC: Dr. Redmond School

## 2020-04-04 NOTE — Patient Instructions (Signed)
1. Schedule MRA head without contrast for December 2021  2. Schedule repeat Neurocognitive testing in March 2022  3. Continue working with [employer] on anxiety, stress management  4. Continue to monitor headaches off prednisone, call for any changes

## 2020-04-07 DIAGNOSIS — K754 Autoimmune hepatitis: Secondary | ICD-10-CM | POA: Diagnosis not present

## 2020-04-11 ENCOUNTER — Ambulatory Visit (HOSPITAL_COMMUNITY): Payer: Self-pay | Admitting: Clinical

## 2020-04-24 DIAGNOSIS — Z419 Encounter for procedure for purposes other than remedying health state, unspecified: Secondary | ICD-10-CM | POA: Diagnosis not present

## 2020-04-25 ENCOUNTER — Ambulatory Visit (INDEPENDENT_AMBULATORY_CARE_PROVIDER_SITE_OTHER): Payer: Medicaid Other | Admitting: Clinical

## 2020-04-25 ENCOUNTER — Other Ambulatory Visit: Payer: Self-pay

## 2020-04-25 DIAGNOSIS — F411 Generalized anxiety disorder: Secondary | ICD-10-CM | POA: Diagnosis not present

## 2020-04-28 NOTE — Progress Notes (Signed)
   THERAPIST PROGRESS NOTE  Session Time: 40 minutes  Participation Level: Active  Behavioral Response: CasualAlertEuthymic  Type of Therapy: Individual Therapy  Treatment Goals addressed: Anxiety  Interventions: CBT  Summary:  Elizabeth Phelps is a 64 y.o. female who presents for the scheduled appointment oriented times five, appropriately dressed, and friendly. Client denied hallucinations and delusions. Client she has been doing well since the last session. Client reported her cousin recently came to town to help her arrange more furniture in her place. Client reported she laughed and had a good visit with her. Client reported during her visit her cousin brought her a lot of things to her attention such as she constantly [place]. Client reported she tells people she is sorry because she does not like people being mad at her. Client reported that brought her to her point of her concern with possible memory loss. Client reported she has trouble forgetting things and often has to write things down so she will remember. Client reported her neurologist thinks it may be ADD or ADHD. Client discussed she doe snot think that is the case. Client describes her experience as "my brain goes on auto pilot" and she does not remember how she got to where she is going. Client reported she was not honest with he neurologist that she continues to drive. Client reported her doctor did not want her to drive herself around. Client reported overall she continues to do well with her mood.      Suicidal/Homicidal: Nowithout intent/plan  Therapist Response:  Therapist began the session checking in and asking the client how she has been since last seen. Therapist actively listened to the clients thoughts and feelings. Therapist asked the client open ended questions about her identified problem and how it is affecting her mood. Therapist discussed with the client what ADHD means and its symptoms. Therapist  asked the client to identify coping skill that has worked and encouraged use to help with organizing and recall of things that need to be done. Therapist assisted with scheduling next appointment.    Plan: Return again in 4 weeks for individual therapy.  Diagnosis: Generalized anxiety disorder   [name], LCSW 04/28/2020

## 2020-05-10 DIAGNOSIS — K754 Autoimmune hepatitis: Secondary | ICD-10-CM | POA: Diagnosis not present

## 2020-05-24 DIAGNOSIS — Z419 Encounter for procedure for purposes other than remedying health state, unspecified: Secondary | ICD-10-CM | POA: Diagnosis not present

## 2020-06-07 ENCOUNTER — Ambulatory Visit
Admission: RE | Admit: 2020-06-07 | Discharge: 2020-06-07 | Disposition: A | Discharge status: Home / Self Care | Payer: Medicaid Other | Source: Ambulatory Visit | Attending: Neurology | Admitting: Neurology

## 2020-06-07 DIAGNOSIS — I671 Cerebral aneurysm, nonruptured: Secondary | ICD-10-CM

## 2020-06-07 DIAGNOSIS — G3184 Mild cognitive impairment, so stated: Secondary | ICD-10-CM

## 2020-06-07 DIAGNOSIS — G43109 Migraine with aura, not intractable, without status migrainosus: Secondary | ICD-10-CM

## 2020-06-08 ENCOUNTER — Other Ambulatory Visit: Payer: Self-pay

## 2020-06-08 ENCOUNTER — Ambulatory Visit (INDEPENDENT_AMBULATORY_CARE_PROVIDER_SITE_OTHER): Payer: Medicaid Other | Admitting: Clinical

## 2020-06-08 DIAGNOSIS — F411 Generalized anxiety disorder: Secondary | ICD-10-CM | POA: Diagnosis not present

## 2020-06-08 NOTE — Progress Notes (Signed)
   THERAPIST PROGRESS NOTE  Session Time: 40 minutes  Participation Level: Active  Behavioral Response: CasualAlertEuthymic  Type of Therapy: Individual Therapy  Treatment Goals addressed: Anxiety  Interventions: CBT  Summary:  Elizabeth Phelps is a 64 y.o. female who presents for the scheduled session oriented times five, appropriately dressed, and friendly. Client denied hallucinations and delusions. Client reported on today doing well. Client reported following the last session she felt down due to having thoughts related to her childhood. Client stated, "I've been dwelling on the past and I can't get it to stop". Client reported worrying about the past affects her ability to remember what needs to be done in the present. Client reflected on how her mother taught her about consequences and that since a young child she has been breaking decisions down into pieces that it makes her question herself excessively to this day. Client reported she is constantly worried about doing things right, if she's made someone mad. Client connected that way of thinking to how her mother degraded her when it came to school performance saying things such as "well lets see how bad you did". Client reported she probably had ADD but it was not diagnosable at the time. Client reported she never felt as though she made her mother proud. When asked by the therapist she said she was unsure if pinpointing particular scenarios would give her any relief to the constant questioning she has. Client reported she has been having reoccurring dreams about her mother and things she'd say in particular. Client reported remembering vividly her mother giving her one compliment that she was a good mother. Client reported she wished her mother was empathic with her as she was her boys. Client reported she raised her boys differently because she wanted them to be proud of themselves. Client reported she would like to speak with a  psychiatrist about her symptoms of difficulty concentrating, repetitive thoughts, and difficulty completing tasks.    Suicidal/Homicidal: Nowithout intent/plan  Therapist Response:  Therapist began the session by checking in and asking the client how she has been doing since last seen. Therapist actively listened to the clients thoughts and feelings. Therapist used reflective communication and empathy in response to the clients thoughts.  Therapist used CBT to collaborate with the client to discuss possible origins of the clients current behaviors from childhood. Therapist discussed with the client utilizing acceptance and acknowledging how she has used her perceived negative traits and fact check with how she has used it to improve her quality of life by choices that she has made.    Plan: Return again in 6 weeks for individual therapy. Client will be scheduled with a psychiatrist for initial evaluation to determine need doe medication management.  Diagnosis: Generalized anxiety disorder    [name], LCSW 06/08/2020

## 2020-06-12 ENCOUNTER — Ambulatory Visit (HOSPITAL_COMMUNITY): Payer: Medicaid Other | Admitting: Psychiatry

## 2020-06-12 ENCOUNTER — Other Ambulatory Visit: Payer: Self-pay | Admitting: Nurse Practitioner

## 2020-06-12 DIAGNOSIS — K754 Autoimmune hepatitis: Secondary | ICD-10-CM | POA: Diagnosis not present

## 2020-06-12 DIAGNOSIS — K743 Primary biliary cirrhosis: Secondary | ICD-10-CM | POA: Diagnosis not present

## 2020-06-12 DIAGNOSIS — K7460 Unspecified cirrhosis of liver: Secondary | ICD-10-CM

## 2020-06-12 DIAGNOSIS — K7469 Other cirrhosis of liver: Secondary | ICD-10-CM | POA: Diagnosis not present

## 2020-06-12 DIAGNOSIS — R1011 Right upper quadrant pain: Secondary | ICD-10-CM | POA: Diagnosis not present

## 2020-06-22 ENCOUNTER — Ambulatory Visit
Admission: RE | Admit: 2020-06-22 | Discharge: 2020-06-22 | Disposition: A | Discharge status: Home / Self Care | Payer: Medicaid Other | Source: Ambulatory Visit | Attending: Nurse Practitioner | Admitting: Nurse Practitioner

## 2020-06-22 DIAGNOSIS — K7460 Unspecified cirrhosis of liver: Secondary | ICD-10-CM

## 2020-06-24 DIAGNOSIS — Z419 Encounter for procedure for purposes other than remedying health state, unspecified: Secondary | ICD-10-CM | POA: Diagnosis not present

## 2020-07-07 ENCOUNTER — Other Ambulatory Visit: Payer: Self-pay | Admitting: *Deleted

## 2020-07-07 NOTE — Patient Outreach (Signed)
Care Coordination  07/07/2020  [name] 08/21/1955 [id number]    Medicaid Managed Care   Unsuccessful Outreach Note  07/07/2020 Name: Elizabeth Phelps MRN: [id number] DOB: 05-08-1956  Referred by: Denita Lung, MD Reason for referral : High Risk Managed Medicaid (Unsuccessful initial outreach)   An unsuccessful telephone outreach was attempted today. The patient was referred to the case management team for assistance with care management and care coordination.   Follow Up Plan: The care management team will reach out to the patient again over the next 7-14 days.   Lurena Joiner RN, BSN [place]  [employer] manager

## 2020-07-07 NOTE — Patient Instructions (Signed)
Visit Information  Ms. Elizabeth Phelps  - as a part of your Medicaid benefit, you are eligible for care management and care coordination services at no cost or copay. I was unable to reach you by phone today but would be happy to help you with your health related needs. Please feel free to call me @ [phone].   A member of the Managed Medicaid care management team will reach out to you again over the next 7-14 days.   Lurena Joiner RN, BSN [place]  [employer] manager

## 2020-07-12 ENCOUNTER — Telehealth (INDEPENDENT_AMBULATORY_CARE_PROVIDER_SITE_OTHER): Payer: Medicaid Other | Admitting: Psychiatry

## 2020-07-12 ENCOUNTER — Other Ambulatory Visit: Payer: Self-pay

## 2020-07-12 ENCOUNTER — Encounter (HOSPITAL_COMMUNITY): Payer: Self-pay | Admitting: Psychiatry

## 2020-07-12 DIAGNOSIS — F411 Generalized anxiety disorder: Secondary | ICD-10-CM

## 2020-07-12 DIAGNOSIS — F331 Major depressive disorder, recurrent, moderate: Secondary | ICD-10-CM | POA: Diagnosis not present

## 2020-07-12 MED ORDER — ARIPIPRAZOLE 2 MG PO TABS: 2.0000 mg | ORAL_TABLET | Freq: Every day | ORAL | 2 refills | Status: DC

## 2020-07-12 MED ORDER — PAROXETINE HCL 30 MG PO TABS: 30.0000 mg | ORAL_TABLET | Freq: Every day | ORAL | 3 refills | Status: DC

## 2020-07-12 NOTE — Progress Notes (Signed)
Psychiatric Initial Adult Assessment  Virtual Visit via Telephone Note  I connected with Elizabeth Phelps on 07/12/20 at  3:00 PM EST by telephone and verified that I am speaking with the correct person using two identifiers.  Location: Patient: home Provider: Clinic   I discussed the limitations, risks, security and privacy concerns of performing an evaluation and management service by telephone and the availability of in person appointments. I also discussed with the patient that there may be a patient responsible charge related to this service. The patient expressed understanding and agreed to proceed.   I provided 30 minutes of non-face-to-face time during this encounter.   Patient Identification: Elizabeth Phelps MRN:  [id number] Date of Evaluation:  07/12/2020  Referral Source: Jill Alexanders, MD. Chief Complaint:"My anxiety is off the roof and I'm depressed"   Visit Diagnosis:    ICD-10-CM   1. Moderate episode of recurrent major depressive disorder (HCC)  F33.1 PARoxetine (PAXIL) 30 MG tablet    ARIPiprazole (ABILIFY) 2 MG tablet  2. Generalized anxiety disorder  F41.1 PARoxetine (PAXIL) 30 MG tablet    History of Present Illness:  65 year old female seen today for initial psychiatric. She was referred to outpatient psychiatry by her neurologist for medication management. She has a psychiatric history of  dysthymia, anxiety and depression. She is currently prescribed Paxil 30 mg and notes that it is somewhat effective in managing her psychiatric conditions.   Today she was unable to login virtually so her exam was done over the phone. She notes that for the last two years she has been more anxious and depressed. She notes in 2020 she lost her job at [employer], she also notes that she also homeless (noting that she lived with her friend who lost her home and then with her son but notes that his gilfrieind is a recovering addict). She now live at [place]. Patient also   notes that she has brain surgery in 2005 to remove a aneurysm. She  notes that since her surgery she has had minimal improvement with the aneurysm or her her memory.   Today provider conducted a GAD 7 and patient scored 20. She notes that her mind constantly races and reports that it never shuts off. She also notes that she is distractible and irritable (generally with older people who are strangers). She denies AH however endorses VH noting that she sees figure go across her bed. Today she denies SI/HI/VAH or paranoia. Patient notes to cope with the above stressors she smokes marijuana a few times a week. She also notes that she smokes four pack of cigarettes a week. Provider informed patient that marijuana can increase irritability, VAH, and anxiety. She endorsed understanding however notes that she uses it to manage her anxiety and help manage her headaches.   Patient notes that she only wants to start one medication today to help manage her symptoms. She notes that her racing thoughts are bothersome. She is agreeable to start Abilify 2 mg to help manage mood. At next visit patient notes she will consider starting hydroxyzine 10 mg thee times daily as needed. She will continue all other medications as prescribed. Patient will follow up with outpatient counseling for therapy. No other concerns noted at this time.  Associated Signs/Symptoms: Depression Symptoms:  anhedonia, fatigue, feelings of worthlessness/guilt, difficulty concentrating, impaired memory, anxiety, loss of energy/fatigue, increased appetite, (Hypo) Manic Symptoms:  Distractibility, Flight of Ideas, Hallucinations, Irritable Mood, Anxiety Symptoms:  Excessive Worry, Psychotic  Symptoms:  Hallucinations: Visual PTSD Symptoms: NA  Past Psychiatric History:  dysthymia, anxiety and depression.  Previous Psychotropic Medications: Xanax, gabapentin, and paxil  Substance Abuse History in the last 12 months:  Yes.     Consequences of Substance Abuse: NA  Past Medical History:  Past Medical History:  Diagnosis Date  . Arnold-Chiari malformation, type I   . Atherosclerosis of aorta 10/02/2017  . Brain aneurysm    Combination of fusiform and saccular right MCA aneurysm   . Cerebral aneurysm without rupture   . Dysthymia 04/03/2012  . Generalized anxiety disorder   . History of colonic polyps 08/12/2016  . Hyperlipidemia LDL goal <100 08/12/2016  . Incomplete right bundle branch block 09/22/2017  . Major depressive disorder   . Migraine headache   . Primary biliary cholangitis 08/04/2018  . Primary biliary cirrhosis   . Smoker     Past Surgical History:  Procedure Laterality Date  . APPENDECTOMY    . BRAIN SURGERY    . CATARACT EXTRACTION Right 2018  . COLONOSCOPY  2007   Dr. Collene Mares  . KNEE ARTHROSCOPY Right   . TONSILLECTOMY AND ADENOIDECTOMY     age 1    Family Psychiatric History: Denies  Family History:  Family History  Problem Relation Age of Onset  . Stroke Mother   . Hypertension Mother   . Prostate cancer Father        mets  . Aneurysm Father   . Breast cancer Maternal Grandmother     Social History:   Social History   Socioeconomic History  . Marital status: Divorced    Spouse name: Not on file  . Number of children: 2  . Years of education: 41  . Highest education level: High school graduate  Occupational History  . Occupation: Unemployed    Comment: Prior Therapist, art  Tobacco Use  . Smoking status: Current Every Day Smoker    Packs/day: 0.75  . Smokeless tobacco: Never Used  Vaping Use  . Vaping Use: Never used  Substance and Sexual Activity  . Alcohol use: No  . Drug use: Yes    Types: Marijuana    Comment: Occasionally to help with migraine headaches  . Sexual activity: Not Currently  Other Topics Concern  . Not on file  Social History Narrative   Right handed   Highest level of edu- HS   Lives in one story home with two roommates   Social  Determinants of Health   Financial Resource Strain: Not on file  Food Insecurity: Not on file  Transportation Needs: Not on file  Physical Activity: Not on file  Stress: Not on file  Social Connections: Not on file    Additional Social History: Patient resides in [place]. She is divorced and have two older sons. She is unemployed. She denies  Alcohol use. Patient note that she smoke marijuana a few times a week and notes that she smokes 4 packs of cigarettes a week.  Allergies:   Allergies  Allergen Reactions  . Codeine Nausea And Vomiting    Metabolic Disorder Labs: No results found for: HGBA1C, MPG No results found for: PROLACTIN Lab Results  Component Value Date   CHOL 211 (H) 03/24/2020   TRIG 117 03/24/2020   HDL 69 03/24/2020   CHOLHDL 3.1 03/24/2020   VLDL 23 11/05/2016   LDLCALC 121 (H) 03/24/2020   LDLCALC 105 (H) 06/10/2019   Lab Results  Component Value Date   TSH 1.660 08/04/2018  Therapeutic Level Labs: No results found for: LITHIUM No results found for: CBMZ No results found for: VALPROATE  Current Medications: Current Outpatient Medications  Medication Sig Dispense Refill  . ARIPiprazole (ABILIFY) 2 MG tablet Take 1 tablet (2 mg total) by mouth daily. 30 tablet 2  . atorvastatin (LIPITOR) 20 MG tablet Take 1 tablet (20 mg total) by mouth daily. 90 tablet 3  . azaTHIOprine (IMURAN) 50 MG tablet Take 50 mg by mouth daily.    Marland Kitchen. ibuprofen (ADVIL,MOTRIN) 600 MG tablet Take 1 tablet (600 mg total) by mouth every 6 (six) hours as needed. 30 tablet 0  . Multiple Vitamin (MULTIVITAMIN WITH MINERALS) TABS Take 1 tablet by mouth daily.    . OCALIVA 10 MG TABS Take by mouth.    . OCALIVA 5 MG TABS     . Omega-3 Fatty Acids (FISH OIL PO) Take by mouth daily.    Marland Kitchen. PARoxetine (PAXIL) 30 MG tablet Take 1 tablet (30 mg total) by mouth daily. 90 tablet 3  . predniSONE (DELTASONE) 10 MG tablet Take 5 mg by mouth daily.     Marland Kitchen. zolmitriptan (ZOMIG) 5 MG tablet Take  1 tablet (5 mg total) by mouth as needed for migraine. 10 tablet 11   No current facility-administered medications for this visit.    Musculoskeletal: Strength & Muscle Tone: Unable to assess due to telephone visit Gait & Station: Unable to assess due to telephone visit Patient leans: N/A  Psychiatric Specialty Exam: Review of Systems  There were no vitals taken for this visit.There is no height or weight on file to calculate BMI.  General Appearance: Unable to assess due to telephone visit  Eye Contact:  Unable to assess due to telephone visit  Speech:  Clear and Coherent and Normal Rate  Volume:  Normal  Mood:  Anxious and Depressed  Affect:  Appropriate and Congruent  Thought Process:  Coherent, Goal Directed and Linear  Orientation:  Full (Time, Place, and Person)  Thought Content:  WDL and Logical  Suicidal Thoughts:  No  Homicidal Thoughts:  No  Memory:  Immediate;   Fair Recent;   Fair Remote;   Fair  Judgement:  Good  Insight:  Good  Psychomotor Activity:  Unable to assess due to telephone visit  Concentration:  Concentration: Fair and Attention Span: Good  Recall:  Good  Fund of Knowledge:Good  Language: Good  Akathisia:  No  Handed:  Right  AIMS (if indicated):  Not done  Assets:  Communication Skills Desire for Improvement Social Support  ADL's:  Intact  Cognition: WNL  Sleep:  Good   Screenings: GAD-7   Flowsheet Row [facility]  Total GAD-7 Score 20 19    Mini-Mental   Flowsheet Row [facility]  Total Score (max 30 points ) 28    PHQ2-9   Flowsheet Row [facility]  PHQ-2 Total Score 3 1 2  0  PHQ-9 Total Score 18 -- 8 --      Assessment and Plan: Patient endorses symptoms of anxiety and depression. She notes that she only wants to start one medication today to help manage her symptoms. She notes that her racing  thoughts are bothersome. She is agreeable to start Abilify 2 mg to help manage mood. At next visit patient notes she will consider starting hydroxyzine 10 mg thee times daily as needed. She will continue all other medications as prescribed.  1. Moderate episode of recurrent major depressive disorder (HCC)  Continue- PARoxetine (PAXIL) 30 MG tablet; Take 1 tablet (30 mg total) by mouth daily.  Dispense: 90 tablet; Refill: 3 Start- ARIPiprazole (ABILIFY) 2 MG tablet; Take 1 tablet (2 mg total) by mouth daily.  Dispense: 30 tablet; Refill: 2  2. Generalized anxiety disorder  Continue- PARoxetine (PAXIL) 30 MG tablet; Take 1 tablet (30 mg total) by mouth daily.  Dispense: 90 tablet; Refill: 3  Follow up in 2 months Follow up with threapy    Salley Slaughter, NP 1/19/20224:00 PM

## 2020-07-15 DIAGNOSIS — R079 Chest pain, unspecified: Secondary | ICD-10-CM | POA: Diagnosis not present

## 2020-07-15 DIAGNOSIS — Z79899 Other long term (current) drug therapy: Secondary | ICD-10-CM | POA: Diagnosis not present

## 2020-07-15 DIAGNOSIS — N390 Urinary tract infection, site not specified: Secondary | ICD-10-CM | POA: Diagnosis not present

## 2020-07-15 DIAGNOSIS — K5730 Diverticulosis of large intestine without perforation or abscess without bleeding: Secondary | ICD-10-CM | POA: Diagnosis not present

## 2020-07-15 DIAGNOSIS — K7460 Unspecified cirrhosis of liver: Secondary | ICD-10-CM | POA: Diagnosis not present

## 2020-07-15 DIAGNOSIS — K838 Other specified diseases of biliary tract: Secondary | ICD-10-CM | POA: Diagnosis not present

## 2020-07-15 DIAGNOSIS — R7989 Other specified abnormal findings of blood chemistry: Secondary | ICD-10-CM | POA: Diagnosis not present

## 2020-07-15 DIAGNOSIS — Z6822 Body mass index (BMI) 22.0-22.9, adult: Secondary | ICD-10-CM | POA: Diagnosis not present

## 2020-07-15 DIAGNOSIS — R1011 Right upper quadrant pain: Secondary | ICD-10-CM | POA: Diagnosis not present

## 2020-07-15 DIAGNOSIS — I700 Atherosclerosis of aorta: Secondary | ICD-10-CM | POA: Diagnosis not present

## 2020-07-15 DIAGNOSIS — J9811 Atelectasis: Secondary | ICD-10-CM | POA: Diagnosis not present

## 2020-07-15 DIAGNOSIS — F17210 Nicotine dependence, cigarettes, uncomplicated: Secondary | ICD-10-CM | POA: Diagnosis not present

## 2020-07-17 ENCOUNTER — Other Ambulatory Visit: Payer: Self-pay | Admitting: Obstetrics and Gynecology

## 2020-07-17 ENCOUNTER — Other Ambulatory Visit: Payer: Self-pay

## 2020-07-17 NOTE — Patient Instructions (Signed)
Hi Elizabeth Phelps, thank you for speaking with me today.  Elizabeth Phelps  was given information about Medicaid Managed Care team care coordination services as a part of their [facility] Medicaid benefit. Elizabeth Phelps verbally consented to engagement with the [facility] Managed Care team.   For questions related to your [facility] health plan, please call: [phone]  If you would like to schedule transportation through your [facility] plan, please call the following number at least 2 days in advance of your appointment: [phone]  Elizabeth Phelps - following are the goals we discussed in your visit today:  Goals Addressed            This Visit's Progress   . Protect My Health       Timeframe:  Long-Range Goal Priority:  High Start Date:     07/17/20                        Expected End Date:     10/15/20                  Follow Up Date1/31/22   - schedule appointment for vaccines needed due to my age or health - schedule recommended health tests (blood work, mammogram, colonoscopy, pap test) - schedule and keep appointment for annual check-up          Patient verbalizes understanding of instructions provided today.   The Managed Medicaid care management team will reach out to the patient again over the next 7 days.  The patient has been provided with contact information for the Managed Medicaid care management team and has been advised to call with any health related questions or concerns.   Elizabeth Raider RN, BSN [place]  [employer] - Managed Medicaid High Risk [phone].  Following is a copy of your plan of care:  Patient Care Plan: General Plan of Care (Adult)       Priority: High  Onset Date: 07/17/2020      Problem Identified: Health Promotion or Disease Self-Management (General Plan of Care)     Goal: Self-Management Plan Developed   Note:   Current Barriers:  . Chronic Disease Management support and  education needs.  Patient presented [facility] over the weekend via ambulance for RUQ pain, diagnosed with UTI.  RUQ pain continues.  Nurse Case Manager Clinical Goal(s):  Marland Kitchen Over the next 30 days, patient will work with PCP to address needs related to RUQ pain. . Over the next 90 days, patient will attend all scheduled medical appointments: . Over the next 30 days patient will work with CM team pharmacist to review medications.  Interventions:  . Inter-disciplinary care team collaboration (see longitudinal plan of care) . Advised patient to contact PCP. Marland Kitchen Reviewed medications with patient. Elizabeth Phelps with Pharmacist regarding medications. . Discussed plans with patient for ongoing care management follow up and provided patient with direct contact information for care management team . Pharmacy referral for medication review.  Patient Goals/Self-Care Activities Over the next 30 days, patient will:  Self administers medications as prescribed Attends all scheduled provider appointments Calls pharmacy for medication refills Calls provider office for new concerns or questions  Follow Up Plan: The Managed Medicaid care management team will reach out to the patient again over the next 7 days.  The patient has been provided with contact information for the Managed Medicaid care management team and has been advised to call with any  health related questions or concerns.

## 2020-07-17 NOTE — Patient Outreach (Signed)
Medicaid Managed Care   Nurse Care Manager Note  07/17/2020 Name:  Elizabeth Phelps MRN:  [id number] DOB:  1956-04-16  Elizabeth Phelps is an 65 y.o. year old female who is a primary patient of Denita Lung, MD.  The [facility] Managed Care Coordination team was consulted for assistance with:    chronic healthcare management needs.  Elizabeth Phelps was given information about Medicaid Managed Care Coordination team services today. [name] agreed to services and verbal consent obtained.  Engaged with patient by telephone for initial visit in response to provider referral for case management and/or care coordination services.   Assessments/Interventions:  Review of past medical history, allergies, medications, health status, including review of consultants reports, laboratory and other test data, was performed as part of comprehensive evaluation and provision of chronic care management services.  SDOH (Social Determinants of Health) assessments and interventions performed:   Care Plan  Allergies  Allergen Reactions  . Codeine Nausea And Vomiting    Medications Reviewed Today    Reviewed by Gayla Medicus, RN (Registered Nurse) on 07/17/20 at 34  Med List Status: <None>  Medication Order Taking? Sig Documenting Provider Last Dose Status Informant  ARIPiprazole (ABILIFY) 2 MG tablet [id number] No Take 1 tablet (2 mg total) by mouth daily.  Patient not taking: Reported on 07/17/2020   Salley Slaughter, NP Not Taking Active   atorvastatin (LIPITOR) 20 MG tablet [id number] Yes Take 1 tablet (20 mg total) by mouth daily. Denita Lung, MD Taking Active   azaTHIOprine (IMURAN) 50 MG tablet [id number] Yes Take 50 mg by mouth daily. Taking a total of 100mg  daily [provider] Taking Active   cephALEXin (KEFLEX) 500 MG capsule [id number] Yes Take 500 mg by mouth 3 (three) times daily. For 7 days. [provider] Taking Active Self  ibuprofen (ADVIL,MOTRIN) 600 MG  tablet [id number] Yes Take 1 tablet (600 mg total) by mouth every 6 (six) hours as needed. Varney Biles, MD Taking Active   Multiple Vitamin (MULTIVITAMIN WITH MINERALS) TABS [id number] Yes Take 1 tablet by mouth daily. [provider] Taking Active Self  OCALIVA 10 [place] [id number] Yes Take by mouth. [provider] Taking Active   OCALIVA 5 MG TABS [id number] No   Patient not taking: Reported on 07/17/2020   [provider] Not Taking Active   Omega-3 Fatty Acids (FISH OIL PO) [id number] No Take by mouth daily.  Patient not taking: Reported on 07/17/2020   [provider] Not Taking Active   PARoxetine (PAXIL) 30 MG tablet [id number] Yes Take 1 tablet (30 mg total) by mouth daily. Salley Slaughter, NP Taking Active   predniSONE (DELTASONE) 10 MG tablet [id number] No Take 5 mg by mouth daily.   Patient not taking: Reported on 07/17/2020   [provider] Not Taking Active   traMADol (ULTRAM) 50 MG tablet [id number] Yes Take by mouth every 6 (six) hours as needed. [provider] Taking Active   zolmitriptan (ZOMIG) 5 MG tablet [id number] Yes Take 1 tablet (5 mg total) by mouth as needed for migraine. Denita Lung, MD Taking Active           Patient Active Problem List   Diagnosis Date Noted  . Major depressive disorder, recurrent episode, mild ([place]) 12/18/2019  . Anxiety disorder due to medical condition 12/18/2019  . Cerebral aneurysm without rupture   . Primary biliary cholangitis ([place]) 08/04/2018  . Atherosclerosis of aorta ([place]) 10/02/2017  .  Incomplete right bundle branch block 09/22/2017  . Arnold-Chiari malformation, type I ([place]) 08/12/2016  . Hyperlipidemia LDL goal <100 08/12/2016  . History of colonic polyps 08/12/2016  . Current smoker 05/25/2013  . Migraine headache 01/08/2013    Conditions to be addressed/monitored per PCP order:  chronic healthcare management needs.        Problem: Health Promotion or Disease  Self-Management (General Plan of Care)   Priority: High  Onset Date: 07/17/2020    Care Plan : General Plan of Care (Adult)  Updates made by Gayla Medicus, RN since 07/17/2020 12:00 AM      Goal: Self-Management Plan Developed   Note:   Current Barriers:  . Chronic Disease Management support and education needs.  Patient presented to [facility] over the weekend via ambulance for RUQ pain, diagnosed with UTI.  RUQ pain continues.  Nurse Case Manager Clinical Goal(s):  Marland Kitchen Over the next 30 days, patient will work with PCP to address needs related to RUQ pain. . Over the next 90 days, patient will attend all scheduled medical appointments: . Over the next 30 days patient will work with CM team pharmacist to review medications.  Interventions:  . Inter-disciplinary care team collaboration (see longitudinal plan of care) . Advised patient to contact PCP. Marland Kitchen Reviewed medications with patient. [name] with Pharmacist regarding medications. . Discussed plans with patient for ongoing care management follow up and provided patient with direct contact information for care management team . Pharmacy referral for medication review.  Patient Goals/Self-Care Activities Over the next 30 days, patient will:  Self administers medications as prescribed Attends all scheduled provider appointments Calls pharmacy for medication refills Calls provider office for new concerns or questions  Follow Up Plan: The Managed Medicaid care management team will reach out to the patient again over the next 7 days.  The patient has been provided with contact information for the Managed Medicaid care management team and has been advised to call with any health related questions or concerns.            Follow Up:  Patient agrees to Care Plan and Follow-up.  Plan: The Managed Medicaid care management team will reach out to the patient again over the next 7 days. and The patient has been provided with  contact information for the Managed Medicaid care management team and has been advised to call with any health related questions or concerns.  Date/time of next scheduled RN care management/care coordination outreach:  07/24/20 at 1030.

## 2020-07-20 ENCOUNTER — Ambulatory Visit: Payer: Medicaid Other | Admitting: Family Medicine

## 2020-07-21 ENCOUNTER — Encounter: Payer: Self-pay | Admitting: Family Medicine

## 2020-07-21 ENCOUNTER — Other Ambulatory Visit: Payer: Self-pay

## 2020-07-21 ENCOUNTER — Ambulatory Visit (INDEPENDENT_AMBULATORY_CARE_PROVIDER_SITE_OTHER): Payer: Medicaid Other | Admitting: Family Medicine

## 2020-07-21 VITALS — BP 122/80 | HR 75 | Temp 97.5°F | Wt 132.0 lb

## 2020-07-21 DIAGNOSIS — K754 Autoimmune hepatitis: Secondary | ICD-10-CM | POA: Diagnosis not present

## 2020-07-21 DIAGNOSIS — K5790 Diverticulosis of intestine, part unspecified, without perforation or abscess without bleeding: Secondary | ICD-10-CM | POA: Diagnosis not present

## 2020-07-21 DIAGNOSIS — N3000 Acute cystitis without hematuria: Secondary | ICD-10-CM

## 2020-07-21 DIAGNOSIS — R1011 Right upper quadrant pain: Secondary | ICD-10-CM

## 2020-07-21 DIAGNOSIS — I700 Atherosclerosis of aorta: Secondary | ICD-10-CM

## 2020-07-21 HISTORY — DX: Diverticulosis of intestine, part unspecified, without perforation or abscess without bleeding: K57.90

## 2020-07-21 NOTE — Progress Notes (Signed)
   Subjective:    Patient ID: Elizabeth Phelps, female    DOB: 02-14-1956, 65 y.o.   MRN: [id number]  HPI She is here for consult concerning difficulty with right upper quadrant pain that started last Wednesday.  She has no nausea, vomiting, diarrhea, change with eating specifically greasy foods.  She does note that deep breathing and coughing does make this worse.  She was seen in [facility] emergency room.  The record was reviewed and did show evidence of urinary nitrite positive.  She was placed on a cephalosporin.  Also CT scanning was done which did show evidence of diverticulosis, cirrhosis, atherosclerosis.  She does have a history of autoimmune hepatitis.  She was seen recently by Dr. Beverley Fiedler   Review of Systems     Objective:   Physical Exam Alert and complaining of right upper quadrant pain.  Positive Murphy's sign and Murphy's punch.       Assessment & Plan:  Right upper quadrant pain - Plan: NM Hepato W/Eject Fract  Diverticulosis  Atherosclerosis of aorta ([place])  Autoimmune hepatitis ([place])  Acute cystitis without hematuria I explained that at this time no definitive diagnosis was made other than her presumed UTI although she is not responded to an antibiotic.  I think it is reasonable to get a HIDA scan to see what her gallbladder function is.

## 2020-07-24 ENCOUNTER — Other Ambulatory Visit: Payer: Self-pay | Admitting: Obstetrics and Gynecology

## 2020-07-24 NOTE — Patient Instructions (Signed)
Hi Ms. Elizabeth Phelps, sorry we missed you today - as a part of your Medicaid benefit, you are eligible for care management and care coordination services at no cost or copay. I was unable to reach you by phone today but would be happy to help you with your health related needs. Please feel free to call me at [phone].  A member of the Managed Medicaid care management team will reach out to you again over the next 7 days.   Aida Raider RN, BSN [place]  [employer] - Managed Medicaid High Risk [phone].

## 2020-07-24 NOTE — Patient Outreach (Signed)
Care Coordination  07/24/2020  Elizabeth Phelps 1955/12/07 [id number]    Medicaid Managed Care   Unsuccessful Outreach Note  07/24/2020 Name: Elizabeth Phelps MRN: [id number] DOB: 17-Feb-1956  Referred by: Denita Lung, MD Reason for referral : High Risk Managed Medicaid (Unsuccessful telephone outreach)   An unsuccessful telephone outreach was attempted today. The patient was referred to the case management team for assistance with care management and care coordination.   Follow Up Plan: A member of the Managed Medicaid  care management team will reach out to the patient again over the next 7 days.   Aida Raider RN, BSN [place]  [employer] - Managed Medicaid High Risk [phone].

## 2020-07-25 DIAGNOSIS — Z419 Encounter for procedure for purposes other than remedying health state, unspecified: Secondary | ICD-10-CM | POA: Diagnosis not present

## 2020-07-27 ENCOUNTER — Other Ambulatory Visit: Payer: Self-pay

## 2020-07-28 ENCOUNTER — Ambulatory Visit (HOSPITAL_COMMUNITY): Payer: Medicaid Other

## 2020-08-02 ENCOUNTER — Telehealth: Payer: Self-pay | Admitting: Family Medicine

## 2020-08-02 NOTE — Telephone Encounter (Signed)
I attempted to reach Elizabeth Phelps today to get her rescheduled with the [facility] with the Managed Medicaid Team. She missed her last phone appt. I left my name and number for her to return my call. I will reach out again in the next 7-14 days if I have not heard back from her.

## 2020-08-03 ENCOUNTER — Other Ambulatory Visit: Payer: Self-pay

## 2020-08-03 MED ORDER — ATORVASTATIN CALCIUM 40 MG PO TABS: 40.0000 mg | ORAL_TABLET | Freq: Every day | ORAL | 3 refills | Status: DC

## 2020-08-03 NOTE — Patient Instructions (Addendum)
Visit Information  Ms. Royal was given information about Medicaid Managed Care team care coordination services as a part of their [facility] benefit. [name] verbally consented to engagement with the [facility] Managed Care team.   For questions related to your [facility] health plan, please call: [phone]  If you would like to schedule transportation through your [facility] plan, please call the following number at least 2 days in advance of your appointment: [phone]  Ms. Sayavong - following are the goals we discussed in your visit today:  Goals Addressed            This Visit's Progress   . Manage My Medicine       Timeframe:  Short-Term Goal Priority:  High Start Date:           Week of 08/07/20 Expected End Date:                       Follow Up Date PRN   - call for medicine refill 2 or 3 days before it runs out    Why is this important?   . These steps will help you keep on track with your medicines.   Notes: Patient hasn't taken Abilify. Her Psych doctor prescribed it after 30 minutes of never meeting her in person while the provider's child yelled on the phone in the background (Per patient). Because of this, she wants to run the new medication by her PCP and her other Mental Health provider (Page) who she has an appt with next week       Please see education materials related to Migraines provided as Advertising account planner.   Patient verbalizes understanding of instructions provided today.   The Managed Medicaid care management team will reach out to the patient again over the next 90 days.   Lane Hacker, [facility]  Following is a copy of your plan of care:  Patient Care Plan: General Plan of Care (Adult)    Problem Identified: Health Promotion or Disease Self-Management (General Plan of Care)   Priority: High  Onset Date: 07/17/2020    Patient Care Plan: General Plan of Care (Adult)    Problem Identified: Health Promotion or  Disease Self-Management (General Plan of Care)     Goal: Self-Management Plan Developed   Note:   Current Barriers:  . Chronic Disease Management support and education needs.  Patient presented to [facility] over the weekend via ambulance for RUQ pain, diagnosed with UTI.  RUQ pain continues.  Nurse Case Manager Clinical Goal(s):  Marland Kitchen Over the next 30 days, patient will work with PCP to address needs related to RUQ pain. . Over the next 90 days, patient will attend all scheduled medical appointments: . Over the next 30 days patient will work with CM team pharmacist to review medications.  Interventions:  . Inter-disciplinary care team collaboration (see longitudinal plan of care) . Advised patient to contact PCP. Marland Kitchen Reviewed medications with patient. [name] with Pharmacist regarding medications. . Discussed plans with patient for ongoing care management follow up and provided patient with direct contact information for care management team . Pharmacy referral for medication review.  Patient Goals/Self-Care Activities Over the next 30 days, patient will:  Self administers medications as prescribed Attends all scheduled provider appointments Calls pharmacy for medication refills Calls provider office for new concerns or questions  Follow Up Plan: The Managed Medicaid care management team will reach out to the patient again over  the next 7 days.  The patient has been provided with contact information for the Managed Medicaid care management team and has been advised to call with any health related questions or concerns.       Task: Mutually Develop and Royce Macadamia Achievement of Patient Goals   Note:   Care Management Activities:    - verbalization of feelings encouraged    Notes:    Patient Care Plan: Medication Management    Problem Identified: Health Promotion or Disease Self-Management (General Plan of Care)     Goal: Medication Management   Note:   Current  Barriers:  . Unable to self administer medications as prescribed . Does not adhere to prescribed medication regimen . Does not maintain contact with provider office . Does not contact provider office for questions/concerns .   Pharmacist Clinical Goal(s):  Marland Kitchen Over the next 90 days, patient will achieve adherence to monitoring guidelines and medication adherence to achieve therapeutic efficacy . contact provider office for questions/concerns as evidenced notation of same in electronic health record through collaboration with PharmD and provider.  .   Interventions: . Inter-disciplinary care team collaboration (see longitudinal plan of care) . Comprehensive medication review performed; medication list updated in electronic medical record  @[email or web address] @  Patient Goals/Self-Care Activities . Over the next 90 days, patient will:  - take medications as prescribed collaborate with provider on medication access solutions  Follow Up Plan: The care management team will reach out to the patient again over the next 90 days.     Task: Mutually Develop and Royce Macadamia Achievement of Patient Goals   Note:   Care Management Activities:    - verbalization of feelings encouraged    Notes:

## 2020-08-03 NOTE — Patient Outreach (Signed)
Medicaid Managed Care    Pharmacy Note  08/03/2020 Name: Elizabeth Phelps MRN: [id number] DOB: Aug 23, 1955  Elizabeth Phelps is a 65 y.o. year old female who is a primary care patient of Denita Lung, MD. The [facility] Managed Care Coordination team was consulted for assistance with disease management and care coordination needs.    Engaged with patient Engaged with patient by telephone for initial visit in response to referral for case management and/or care coordination services.  [name] was given information about Managed Medicaid Care Coordination team services today. [name] agreed to services and verbal consent obtained.   Objective:  Lab Results  Component Value Date   CREATININE 0.76 03/24/2020   CREATININE 0.79 06/10/2019   CREATININE 0.56 06/11/2018    No results found for: HGBA1C     Component Value Date/Time   CHOL 211 (H) 03/24/2020 1559   TRIG 117 03/24/2020 1559   HDL 69 03/24/2020 1559   CHOLHDL 3.1 03/24/2020 1559   CHOLHDL 3.5 11/05/2016 0736   VLDL 23 11/05/2016 0736   LDLCALC 121 (H) 03/24/2020 1559    Other: (TSH, CBC, Vit D, etc.)  Clinical ASCVD: No  The 10-year ASCVD risk score [name] DC Jr., et al., 2013) is: 8%   Values used to calculate the score:     Age: 60 years     Sex: Female     Is Non-Hispanic African American: No     Diabetic: No     Tobacco smoker: Yes     Systolic Blood Pressure: 409 mmHg     Is BP treated: No     HDL Cholesterol: 69 mg/dL     Total Cholesterol: 211 mg/dL    Other: (CHADS2VASc if Afib, PHQ9 if depression, MMRC or CAT for COPD, ACT, DEXA)  BP Readings from Last 3 Encounters:  07/21/20 122/80  04/04/20 113/75  03/24/20 110/74    Assessment/Interventions: Review of patient past medical history, allergies, medications, health status, including review of consultants reports, laboratory and other test data, was performed as part of comprehensive evaluation and provision of chronic care management  services.    Mood Aripiprazole (Never taken) Paroxetine -Wants to see Page (Other Mental Health person) next week to make sure Abilify is OK -Went to [employer] (Referred from neuro), upset that Psyc put her on Abilify after 30 minutes of speaking while dr's 48 year old kid screamed in the background Plan: Did not pressure patient to take medication. Told her risks, ADR' and recommended she run it by Page and her PCP whom she has a great relationship with both   Lipids Lab Results  Component Value Date   CHOL 211 (H) 03/24/2020   CHOL 178 06/10/2019   CHOL 154 09/22/2017   Lab Results  Component Value Date   HDL 69 03/24/2020   HDL 56 06/10/2019   HDL 43 09/22/2017   Lab Results  Component Value Date   LDLCALC 121 (H) 03/24/2020   LDLCALC 105 (H) 06/10/2019   LDLCALC 70 09/22/2017   Lab Results  Component Value Date   TRIG 117 03/24/2020   TRIG 96 06/10/2019   TRIG 206 (H) 09/22/2017   Lab Results  Component Value Date   CHOLHDL 3.1 03/24/2020   CHOLHDL 3.2 06/10/2019   CHOLHDL 3.6 09/22/2017   No results found for: LDLDIRECT Atorvastatin 20mg  Plan: Could use higher dose statin but patient stated she doesn't trust doctors initially. Was hesitant to recommend anything new after just  meeting her. Will ask PCP about possibility of increasing  Rejection  Azathioprine Plan: At goal,  patient stable/ symptoms controlled   PBC Alvino Blood (Comes from [facility] because no one in [place] can get it)  Pain Tramadol (Not taking anymore)  Migraines -Smokes Pot for migraine Zolmitriptan Plan: At goal,  patient stable/ symptoms controlled   Meds: -Likes going to Pharmacy because only time to go out  SDOH (Social Determinants of Health) assessments and interventions performed:    Care Plan  Allergies  Allergen Reactions  . Codeine Nausea And Vomiting    Medications Reviewed Today    Reviewed by [name], RMA (Registered  Medical Assistant) on 07/21/20 at 57  Med List Status: <None>  Medication Order Taking? Sig Documenting Provider Last Dose Status Informant  ARIPiprazole (ABILIFY) 2 MG tablet [id number] No Take 1 tablet (2 mg total) by mouth daily.  Patient not taking: No sig reported   Salley Slaughter, NP Not Taking Active   atorvastatin (LIPITOR) 20 MG tablet [id number] Yes Take 1 tablet (20 mg total) by mouth daily. Denita Lung, MD Taking Active   azaTHIOprine (IMURAN) 50 MG tablet [id number] Yes Take 50 mg by mouth daily. Taking a total of 100mg  daily [provider] Taking Active   cephALEXin (KEFLEX) 500 MG capsule [id number] Yes Take 500 mg by mouth 3 (three) times daily. For 7 days. [provider] Taking Active Self  ibuprofen (ADVIL,MOTRIN) 600 MG tablet [id number] Yes Take 1 tablet (600 mg total) by mouth every 6 (six) hours as needed. Varney Biles, MD Taking Active   Multiple Vitamin (MULTIVITAMIN WITH MINERALS) TABS [id number] Yes Take 1 tablet by mouth daily. [provider] Taking Active Self  OCALIVA 10 Urania [id number] Yes Take by mouth. [provider] Taking Active   OCALIVA 5 MG TABS [id number] No   Patient not taking: No sig reported   [provider] Not Taking Active   Omega-3 Fatty Acids (FISH OIL PO) [id number] No Take by mouth daily.  Patient not taking: No sig reported   [provider] Not Taking Active   PARoxetine (PAXIL) 30 MG tablet [id number] Yes Take 1 tablet (30 mg total) by mouth daily. Salley Slaughter, NP Taking Active   predniSONE (DELTASONE) 10 MG tablet [id number] No Take 5 mg by mouth daily.   Patient not taking: No sig reported   [provider] Not Taking Active   traMADol (ULTRAM) 50 MG tablet [id number] Yes Take by mouth every 6 (six) hours as needed. [provider] Taking Active   zolmitriptan (ZOMIG) 5 MG tablet [id number] Yes Take 1 tablet (5 mg total) by mouth as needed for migraine.  Denita Lung, MD Taking Active           Patient Active Problem List   Diagnosis Date Noted  . Diverticulosis 07/21/2020  . Major depressive disorder, recurrent episode, mild ([place]) 12/18/2019  . Anxiety disorder due to medical condition 12/18/2019  . Cerebral aneurysm without rupture   . Autoimmune hepatitis ([place]) 08/04/2018  . Atherosclerosis of aorta ([place]) 10/02/2017  . Incomplete right bundle branch block 09/22/2017  . Arnold-Chiari malformation, type I ([place]) 08/12/2016  . Hyperlipidemia LDL goal <100 08/12/2016  . History of colonic polyps 08/12/2016  . Current smoker 05/25/2013  . Migraine headache 01/08/2013    Conditions to be addressed/monitored: Depression   Care Plan : Medication Management  Updates made by Lane Hacker, [place] since 08/03/2020 12:00 AM  Problem: Health Promotion or Disease Self-Management (General Plan of Care)     Goal: Medication Management   Note:   Current Barriers:  . Unable to self administer medications as prescribed . Does not adhere to prescribed medication regimen . Does not maintain contact with provider office . Does not contact provider office for questions/concerns .   Pharmacist Clinical Goal(s):  Marland Kitchen Over the next 90 days, patient will achieve adherence to monitoring guidelines and medication adherence to achieve therapeutic efficacy . contact provider office for questions/concerns as evidenced notation of same in electronic health record through collaboration with PharmD and provider.  .   Interventions: . Inter-disciplinary care team collaboration (see longitudinal plan of care) . Comprehensive medication review performed; medication list updated in electronic medical record  @[email or web address] @  Patient Goals/Self-Care Activities . Over the next 90 days, patient will:  - take medications as prescribed collaborate with provider on medication access solutions  Follow Up Plan: The care management team will reach out  to the patient again over the next 90 days.     Task: Mutually Develop and Royce Macadamia Achievement of Patient Goals   Note:   Care Management Activities:    - verbalization of feelings encouraged    Notes:      Medication Assistance: None required. Patient affirms current coverage meets needs.   Follow up: Agree   Plan: The care management team will reach out to the patient again over the next 90 days.   [name], Pharm.D., Managed Medicaid Pharmacist - [phone]

## 2020-08-03 NOTE — Addendum Note (Signed)
Addended by: Denita Lung on: 08/03/2020 01:23 PM   Modules accepted: Orders

## 2020-08-03 NOTE — Progress Notes (Signed)
I will increase her Lipitor since there is evidence of atherosclerosis.

## 2020-08-07 ENCOUNTER — Ambulatory Visit (HOSPITAL_COMMUNITY): Payer: Medicaid Other | Admitting: Clinical

## 2020-08-07 ENCOUNTER — Telehealth: Payer: Self-pay | Admitting: Family Medicine

## 2020-08-07 NOTE — Telephone Encounter (Signed)
  Aerie called, she tested positive for Covid before she was supposed to have gallbladder test done,   She is over covid now but states her pain is gone and wants to know if you think she should reschedule the test or just hold off since she is not having any pain now

## 2020-08-07 NOTE — Telephone Encounter (Signed)
She can certainly hold off for right now

## 2020-08-08 NOTE — Telephone Encounter (Signed)
Pt was advised [facility] 

## 2020-08-21 ENCOUNTER — Ambulatory Visit (HOSPITAL_COMMUNITY): Payer: Medicaid Other | Admitting: Clinical

## 2020-08-22 DIAGNOSIS — Z419 Encounter for procedure for purposes other than remedying health state, unspecified: Secondary | ICD-10-CM | POA: Diagnosis not present

## 2020-08-29 ENCOUNTER — Encounter: Payer: Medicaid Other | Admitting: Psychology

## 2020-09-05 ENCOUNTER — Encounter: Payer: Medicaid Other | Admitting: Psychology

## 2020-09-07 ENCOUNTER — Other Ambulatory Visit: Payer: Self-pay

## 2020-09-07 ENCOUNTER — Encounter (HOSPITAL_COMMUNITY): Payer: Self-pay | Admitting: Psychiatry

## 2020-09-07 ENCOUNTER — Telehealth (INDEPENDENT_AMBULATORY_CARE_PROVIDER_SITE_OTHER): Payer: Medicaid Other | Admitting: Psychiatry

## 2020-09-07 DIAGNOSIS — F411 Generalized anxiety disorder: Secondary | ICD-10-CM

## 2020-09-07 DIAGNOSIS — F331 Major depressive disorder, recurrent, moderate: Secondary | ICD-10-CM | POA: Diagnosis not present

## 2020-09-07 MED ORDER — PAROXETINE HCL 40 MG PO TABS: 40.0000 mg | ORAL_TABLET | Freq: Every day | ORAL | 2 refills | Status: DC

## 2020-09-07 MED ORDER — REXULTI 0.5 MG PO TABS: 0.5000 mg | ORAL_TABLET | Freq: Every day | ORAL | 2 refills | Status: DC

## 2020-09-07 NOTE — Progress Notes (Signed)
[place] MD/PA/NP OP Progress Note Virtual Visit via Video Note  I connected with Elizabeth Phelps on 09/07/20 at 10:00 AM EDT by a video enabled telemedicine application and verified that I am speaking with the correct person using two identifiers.  Location: Patient: Home Provider: Clinic   I discussed the limitations of evaluation and management by telemedicine and the availability of in person appointments. The patient expressed understanding and agreed to proceed.  I provided 30 minutes of non-face-to-face time during this encounter.      09/07/2020 11:50 AM Elizabeth Phelps  MRN:  [id number]  Chief Complaint: "The Abilify gave me brain fog"  HPI: 65 year old female seen today for follow up psychiatric. She has a psychiatric history of  dysthymia, anxiety and depression. She is currently prescribed Paxil 30 mg and Abilify 2 mg daily.  She notes that she discontinued Abilify because it caused her to have brain fog.  She notes that Paxil is somewhat effective in managing her psychiatric condition.    Today she is well-groomed, pleasant, cooperative, and engaged in conversation.  She informed provider that since her last visit she has been more anxious and depressed.  She notes that she discontinued Abilify because it caused her to have a brain fog and reports that she was having difficulty remembering the names of her children or completing tasks.  Patient also reports to provider that she often gets lost.  She notes that her children has taken the keys away from her car.  Today provider conducted a GAD-7 and patient scored a 20.  She notes that she worries about her son and her finances.  She also notes that she becomes irritated with her son's girlfriend who has ADHD.  She informed provider that she has 6 grandchildren and are concerned for them at times.  Provider also conducted a PHQ-9 and patient scored a 19.  Patient notes that she continues to have racing thoughts, distractibility, and  irritability.  At times she notes that she sees shadows in the periphery of her eyes.  She denies auditory hallucinations.  Patient reports that she sleeps 8 to 10 hours a night (noting at times her night and days are mixed up).  She endorses having a poor appetite.  She notes that she eats once a day.    Patient also reports that she smokes marijuana occasionally to cope with her anxiety and depression.  Provider informed patient that marijuana use can exacerbate her anxiety and depression.  She endorsed understanding and agreed.  She notes that for the last 3 days she has not smoked anything  Today she notes that she does not want to restart Abilify.  She is agreeable to starting Rexulti 0.5 mg daily to help manage depression.  She is also agreeable to increasing Paxil 30 mg to 40 mg to help manage anxiety and depression.  Provider recommended patient taking hydroxyzine however at this time she notes that she would like to hold off on it.  Potential side effects of medication and risks vs benefits of treatment vs non-treatment were explained and discussed. All questions were answered.    Visit Diagnosis:    ICD-10-CM   1. Moderate episode of recurrent major depressive disorder (HCC)  F33.1 PARoxetine (PAXIL) 40 MG tablet    Brexpiprazole (REXULTI) 0.5 MG TABS  2. Generalized anxiety disorder  F41.1 PARoxetine (PAXIL) 40 MG tablet    Past Psychiatric History: dysthymia, anxiety and depression  Past Medical History:  Past Medical History:  Diagnosis Date  . Arnold-Chiari malformation, type I   . Atherosclerosis of aorta 10/02/2017  . Brain aneurysm    Combination of fusiform and saccular right MCA aneurysm   . Cerebral aneurysm without rupture   . Dysthymia 04/03/2012  . Generalized anxiety disorder   . History of colonic polyps 08/12/2016  . Hyperlipidemia LDL goal <100 08/12/2016  . Incomplete right bundle branch block 09/22/2017  . Major depressive disorder   . Migraine headache   .  Primary biliary cholangitis 08/04/2018  . Primary biliary cirrhosis   . Smoker     Past Surgical History:  Procedure Laterality Date  . APPENDECTOMY    . BRAIN SURGERY    . CATARACT EXTRACTION Right 2018  . COLONOSCOPY  2007   Dr. Collene Mares  . KNEE ARTHROSCOPY Right   . TONSILLECTOMY AND ADENOIDECTOMY     age 3    Family Psychiatric History:  Denies Family History:  Family History  Problem Relation Age of Onset  . Stroke Mother   . Hypertension Mother   . Prostate cancer Father        mets  . Aneurysm Father   . Breast cancer Maternal Grandmother     Social History:  Social History   Socioeconomic History  . Marital status: Divorced    Spouse name: Not on file  . Number of children: 2  . Years of education: 66  . Highest education level: High school graduate  Occupational History  . Occupation: Unemployed    Comment: Prior Therapist, art  Tobacco Use  . Smoking status: Current Every Day Smoker    Packs/day: 0.75  . Smokeless tobacco: Never Used  Vaping Use  . Vaping Use: Never used  Substance and Sexual Activity  . Alcohol use: No  . Drug use: Yes    Types: Marijuana    Comment: Occasionally to help with migraine headaches  . Sexual activity: Not Currently  Other Topics Concern  . Not on file  Social History Narrative   Right handed   Highest level of edu- HS   Lives in one story home with two roommates   Social Determinants of Health   Financial Resource Strain: Not on file  Food Insecurity: Not on file  Transportation Needs: Not on file  Physical Activity: Not on file  Stress: Not on file  Social Connections: Not on file    Allergies:  Allergies  Allergen Reactions  . Codeine Nausea And Vomiting    Metabolic Disorder Labs: No results found for: HGBA1C, MPG No results found for: PROLACTIN Lab Results  Component Value Date   CHOL 211 (H) 03/24/2020   TRIG 117 03/24/2020   HDL 69 03/24/2020   CHOLHDL 3.1 03/24/2020   VLDL 23 11/05/2016    LDLCALC 121 (H) 03/24/2020   LDLCALC 105 (H) 06/10/2019   Lab Results  Component Value Date   TSH 1.660 08/04/2018   TSH 1.760 09/22/2017    Therapeutic Level Labs: No results found for: LITHIUM No results found for: VALPROATE No components found for:  CBMZ  Current Medications: Current Outpatient Medications  Medication Sig Dispense Refill  . Brexpiprazole (REXULTI) 0.5 MG TABS Take 1 tablet (0.5 mg total) by mouth daily. 30 tablet 2  . atorvastatin (LIPITOR) 40 MG tablet Take 1 tablet (40 mg total) by mouth daily. 90 tablet 3  . azaTHIOprine (IMURAN) 50 MG tablet Take 50 mg by mouth daily. Taking a total of 100mg  daily    . cephALEXin (KEFLEX) 500  MG capsule Take 500 mg by mouth 3 (three) times daily. For 7 days. (Patient not taking: Reported on 08/03/2020)    . Multiple Vitamin (MULTIVITAMIN WITH MINERALS) TABS Take 1 tablet by mouth daily.    . OCALIVA 10 MG TABS Take by mouth.    Alvino Blood 5 MG TABS  (Patient not taking: No sig reported)    . Omega-3 Fatty Acids (FISH OIL PO) Take by mouth daily. (Patient not taking: No sig reported)    . PARoxetine (PAXIL) 40 MG tablet Take 1 tablet (40 mg total) by mouth daily. 30 tablet 2  . predniSONE (DELTASONE) 10 MG tablet Take 5 mg by mouth daily.  (Patient not taking: No sig reported)    . zolmitriptan (ZOMIG) 5 MG tablet Take 1 tablet (5 mg total) by mouth as needed for migraine. 10 tablet 11   No current facility-administered medications for this visit.     Musculoskeletal: Strength & Muscle Tone: Unable to assess due to telehealth visit [place]: Unable to assess due to telehealth visit Patient leans: N/A  Psychiatric Specialty Exam: Review of Systems  There were no vitals taken for this visit.There is no height or weight on file to calculate BMI.  General Appearance: Well Groomed  Eye Contact:  Good  Speech:  Clear and Coherent and Normal Rate  Volume:  Normal  Mood:  Anxious and Depressed  Affect:  Appropriate  and Congruent  Thought Process:  Coherent, Goal Directed and Linear  Orientation:  Full (Time, Place, and Person)  Thought Content: Logical and Hallucinations: Visual   Suicidal Thoughts:  No  Homicidal Thoughts:  No  Memory:  Immediate;   Good Recent;   Good Remote;   Good  Judgement:  Good  Insight:  Good  Psychomotor Activity:  Normal  Concentration:  Concentration: Good and Attention Span: Good  Recall:  Good  Fund of Knowledge: Good  Language: Good  Akathisia:  No  Handed:  Right  AIMS (if indicated):Not done  Assets:  Communication Skills Desire for Improvement Financial Resources/Insurance Housing Social Support  ADL's:  Intact  Cognition: WNL  Sleep:  Fair   Screenings: GAD-7   Flowsheet Row Video Visit from 09/07/2020 in [facility] Video Visit from 07/12/2020 in [facility] Counselor from 12/16/2019 in [facility]  Total GAD-7 Score 20 20 19     Mini-Mental   [place] Office Visit from 08/04/2018 in [place]  Total Score (max 30 points ) 28    PHQ2-9   Flowsheet Row Video Visit from 09/07/2020 in [facility] Video Visit from 07/12/2020 in [facility] Office Visit from 03/24/2020 in [place] from 12/16/2019 in [facility] Office Visit from 09/29/2018 in [place]  PHQ-2 Total Score 4 3 1 2  0  PHQ-9 Total Score 19 18 -- 8 --       Assessment and Plan: Patient endorses symptoms of anxiety, depression, and VH. Today she notes that she does not want to restart Abilify.  She is agreeable to starting Rexulti 0.5 mg daily to help manage depression.  She is also agreeable to increasing Paxil 30 mg to 40 mg to help manage anxiety and depression.  Provider recommended patient taking hydroxyzine however at this time she notes that she would like to hold  off on it.  1. Moderate episode of recurrent major depressive disorder ([place])  Increased- PARoxetine (PAXIL) 40 MG tablet; Take 1 tablet (40 mg total) by mouth daily.  Dispense: 30 tablet; Refill: 2 Start- Brexpiprazole (REXULTI) 0.5 MG TABS; Take 1 tablet (0.5 mg total) by mouth daily.  Dispense: 30 tablet; Refill: 2  2. Generalized anxiety disorder  INcreased- PARoxetine (PAXIL) 40 MG tablet; Take 1 tablet (40 mg total) by mouth daily.  Dispense: 30 tablet; Refill: 2    Salley Slaughter, NP 09/07/2020, 11:50 AM

## 2020-09-13 ENCOUNTER — Other Ambulatory Visit: Payer: Self-pay

## 2020-09-13 ENCOUNTER — Ambulatory Visit (INDEPENDENT_AMBULATORY_CARE_PROVIDER_SITE_OTHER): Payer: Medicaid Other | Admitting: Clinical

## 2020-09-13 DIAGNOSIS — F411 Generalized anxiety disorder: Secondary | ICD-10-CM | POA: Diagnosis not present

## 2020-09-14 DIAGNOSIS — K7469 Other cirrhosis of liver: Secondary | ICD-10-CM | POA: Diagnosis not present

## 2020-09-14 DIAGNOSIS — K743 Primary biliary cirrhosis: Secondary | ICD-10-CM | POA: Diagnosis not present

## 2020-09-14 DIAGNOSIS — K754 Autoimmune hepatitis: Secondary | ICD-10-CM | POA: Diagnosis not present

## 2020-09-16 NOTE — Progress Notes (Signed)
   THERAPIST PROGRESS NOTE Virtual Visit via Telephone Note  I connected with Elizabeth Phelps on 09/13/2020 at  1:00 PM EDT by telephone and verified that I am speaking with the correct person using two identifiers.  Location: Patient: home Provider: office   I discussed the limitations, risks, security and privacy concerns of performing an evaluation and management service by telephone and the availability of in person appointments. I also discussed with the patient that there may be a patient responsible charge related to this service. The patient expressed understanding and agreed to proceed.   Follow Up Instructions: I discussed the assessment and treatment plan with the patient. The patient was provided an opportunity to ask questions and all were answered. The patient agreed with the plan and demonstrated an understanding of the instructions.   The patient was advised to call back or seek an in-person evaluation if the symptoms worsen or if the condition fails to improve as anticipated.   Session Time: 30 minutes  Participation Level: Active  Behavioral Response: NAAlertAnxious  Type of Therapy: Individual Therapy  Treatment Goals addressed: Anxiety  Interventions: CBT  Summary:  Elizabeth Phelps is a 65 y.o. female who presents for the session oriented times five and friendly. Client denied hallucinations and delusions. Client reported on today she has been doing well but having some difficulty with her medications. Client reported she is feeling worse than before being on her prescribed medication. Client reported her "anxiety is out the roof now". Client reported she has felt overwhelmed by the amount of medication she is taking on top of the others she needs to take for medical reasons. Client reported she has experienced side effect of headaches from the medication. Client reported "I still cant shut my mind off". Client reported she has to hear things multiple times to  grasp what is being said. Client reported her neurologist will be doing a memory test on her in April. Client reported she would like to stop taking the medication if she can until the test is done to see if there is a definite diagnosis for what's going on. Client reported she doesn't think her medication is treating what her symptoms are.     Suicidal/Homicidal: Nowithout intent/plan  Therapist Response:  Therapist began the session checking in asking how she has been. Therapist actively listened to the clients thoughts and feelings. Therapist used CBT to normalize the clients feelings. Therapist used CBT to engage with the client in developing positive self talk to problem solve her concerns. Client was scheduled for next appointment.   Plan: Return again in 4 weeks for individual therapy.  Diagnosis: Generalized anxiety disorder  Birdena Jubilee Jamisen Duerson, LCSW 09/13/2020

## 2020-09-22 ENCOUNTER — Telehealth: Payer: Self-pay | Admitting: Family Medicine

## 2020-09-22 DIAGNOSIS — Z419 Encounter for procedure for purposes other than remedying health state, unspecified: Secondary | ICD-10-CM | POA: Diagnosis not present

## 2020-09-22 NOTE — Telephone Encounter (Signed)
I attempted to reach [place] today to get her rescheduled for a phone visit with the [facility] on the Managed Medicaid Team. I left my name and number on her VM.

## 2020-10-13 ENCOUNTER — Telehealth: Payer: Self-pay | Admitting: Family Medicine

## 2020-10-13 NOTE — Telephone Encounter (Signed)
2nd attempt to reach [place] to get her rescheduled for a phone visit with the MM RNCM. I left my name and number on her VM.

## 2020-10-17 ENCOUNTER — Encounter: Payer: Medicaid Other | Admitting: Psychology

## 2020-10-20 ENCOUNTER — Telehealth: Payer: Self-pay | Admitting: Family Medicine

## 2020-10-20 NOTE — Telephone Encounter (Signed)
Third attempt to reach Elizabeth Phelps to get her rescheduled with the MM RNCM. I left my name and number again.

## 2020-10-24 ENCOUNTER — Encounter: Payer: Medicaid Other | Admitting: Psychology

## 2020-11-02 ENCOUNTER — Other Ambulatory Visit: Payer: Self-pay

## 2020-11-07 ENCOUNTER — Other Ambulatory Visit: Payer: Self-pay | Admitting: Obstetrics and Gynecology

## 2020-11-07 ENCOUNTER — Other Ambulatory Visit: Payer: Self-pay

## 2020-11-07 NOTE — Patient Outreach (Signed)
Care Coordination  11/07/2020  KAYDINCE TOWLES 1956/05/02 [id number]    Medicaid Managed Care   Unsuccessful Outreach Note  11/07/2020 Name: SHYLYNN BRUNING MRN: [id number] DOB: May 14, 1956  Referred by: Denita Lung, MD Reason for referral : High Risk Managed Medicaid (Unsuccessful telephone outreach)   Third unsuccessful telephone outreach was attempted.  The patient was referred to the case management team for assistance with care management and care coordination. The patient's primary care provider has been notified of our unsuccessful attempts to make or maintain contact with the patient. The care management team is pleased to engage with this patient at any time in the future should he/she be interested in assistance from the care management team.   Follow Up Plan: We have been unable to make contact with the patient for follow up. The care management team is available to follow up with the patient after provider conversation with the patient regarding recommendation for care management engagement and subsequent re-referral to the care management team.   Aida Raider RN, BSN [place] Management Coordinator - Managed [facility] High Risk [phone].

## 2020-11-09 ENCOUNTER — Other Ambulatory Visit: Payer: Self-pay

## 2020-11-09 NOTE — Patient Outreach (Signed)
Medicaid Managed Care    Pharmacy Note  11/09/2020 Name: Elizabeth Phelps MRN: [id number] DOB: [birth year]  Elizabeth Phelps is a 65 y.o. year old female who is a primary care patient of Denita Lung, MD. The [facility] Managed Care Coordination team was consulted for assistance with disease management and care coordination needs.    Engaged with patient Engaged with patient by telephone for initial visit in response to referral for case management and/or care coordination services.  Elizabeth Phelps was given information about Managed Medicaid Care Coordination team services today. Elizabeth Phelps agreed to services and verbal consent obtained.   Objective:  Lab Results  Component Value Date   CREATININE 0.76 03/24/2020   CREATININE 0.79 06/10/2019   CREATININE 0.56 06/11/2018    No results found for: HGBA1C     Component Value Date/Time   CHOL 211 (H) 03/24/2020 1559   TRIG 117 03/24/2020 1559   HDL 69 03/24/2020 1559   CHOLHDL 3.1 03/24/2020 1559   CHOLHDL 3.5 11/05/2016 0736   VLDL 23 11/05/2016 0736   LDLCALC 121 (H) 03/24/2020 1559    Other: (TSH, CBC, Vit D, etc.)  Clinical ASCVD: No  The 10-year ASCVD risk score Elizabeth Bussing DC Jr., et al., 2013) is: 8%   Values used to calculate the score:     Age: 26 years     Sex: Female     Is Non-Hispanic African American: No     Diabetic: No     Tobacco smoker: Yes     Systolic Blood Pressure: 409 mmHg     Is BP treated: No     HDL Cholesterol: 69 mg/dL     Total Cholesterol: 211 mg/dL    Other: (CHADS2VASc if Afib, PHQ9 if depression, MMRC or CAT for COPD, ACT, DEXA)  BP Readings from Last 3 Encounters:  07/21/20 122/80  04/04/20 113/75  03/24/20 110/74    Assessment/Interventions: Review of patient past medical history, allergies, medications, health status, including review of consultants reports, laboratory and other test data, was performed as part of comprehensive evaluation and provision of chronic care management  services.    Possible Covid Exposure -Patient answered the phone with stuffy nose and monotone. Said she thought it was allergies but that she's never had allergies before. I said, "It may be a good idea to get tested, just in case." She told me, "I don't think I need to, I got both my shots but I haven't been boosted yet." I recommended she get a test again and asked if she needed help scheduling one. She told me she didn't need help because she took her grandson 6 days ago to get a Covid Test at [facility] which came back positive, she's the one who drove/took him. Plan: Patient will schedule test at [facility] ASAP. Potential for Paxlovid but she is on many meds that have possibility to interact. F/U in 2 weeks to check in on patient and will go over meds again then, main priority was Covid potential today   SDOH (Social Determinants of Health) assessments and interventions performed:    Care Plan  Allergies  Allergen Reactions  . Codeine Nausea And Vomiting    Medications Reviewed Today    Reviewed by Salley Slaughter, NP (Nurse Practitioner) on 09/07/20 at 1037  Med List Status: <None>  Medication Order Taking? Sig Documenting Provider Last Dose Status Informant  atorvastatin (LIPITOR) 40 MG tablet [id number]  Take 1 tablet (40 mg total) by mouth daily. [employer],  Elizabeth Jarvis, MD  Active   azaTHIOprine (IMURAN) 50 MG tablet [id number] No Take 50 mg by mouth daily. Taking a total of 100mg  daily [provider] Taking Active   Brexpiprazole (REXULTI) 0.5 MG TABS [id number] Yes Take 1 tablet (0.5 mg total) by mouth daily. Salley Slaughter, NP  Active   cephALEXin (KEFLEX) 500 MG capsule [id number] No Take 500 mg by mouth 3 (three) times daily. For 7 days.  Patient not taking: Reported on 08/03/2020   [provider] Not Taking Active Self  Multiple Vitamin (MULTIVITAMIN WITH MINERALS) TABS [id number] No Take 1 tablet by mouth daily. [provider] Taking Active Self  OCALIVA 10 MG  TABS [id number] No Take by mouth. [provider] Taking Active   OCALIVA 5 MG TABS [id number] No   Patient not taking: No sig reported   [provider] Not Taking Active   Omega-3 Fatty Acids (FISH OIL PO) [id number] No Take by mouth daily.  Patient not taking: No sig reported   [provider] Not Taking Active   PARoxetine (PAXIL) 40 MG tablet [id number]  Take 1 tablet (40 mg total) by mouth daily. Salley Slaughter, NP  Active   predniSONE (DELTASONE) 10 MG tablet [id number] No Take 5 mg by mouth daily.   Patient not taking: No sig reported   [provider] Not Taking Active   zolmitriptan (ZOMIG) 5 MG tablet [id number] No Take 1 tablet (5 mg total) by mouth as needed for migraine. Denita Lung, MD Taking Active           Patient Active Problem List   Diagnosis Date Noted  . Diverticulosis 07/21/2020  . Major depressive disorder, recurrent episode, mild ([place]) 12/18/2019  . Anxiety disorder due to medical condition 12/18/2019  . Cerebral aneurysm without rupture   . Autoimmune hepatitis ([place]) 08/04/2018  . Atherosclerosis of aorta ([place]) 10/02/2017  . Incomplete right bundle branch block 09/22/2017  . Arnold-Chiari malformation, type I ([place]) 08/12/2016  . Hyperlipidemia LDL goal <100 08/12/2016  . History of colonic polyps 08/12/2016  . Current smoker 05/25/2013  . Migraine headache 01/08/2013    Conditions to be addressed/monitored: Depression   Care Plan : Medication Management  Updates made by Lane Hacker, [place] since 08/03/2020 12:00 AM    Problem: Health Promotion or Disease Self-Management (General Plan of Care)     Goal: Medication Management   Note:   Current Barriers:  . Unable to self administer medications as prescribed . Does not adhere to prescribed medication regimen . Does not maintain contact with provider office . Does not contact provider office for questions/concerns .   Pharmacist Clinical Goal(s):  Marland Kitchen Over  the next 90 days, patient will achieve adherence to monitoring guidelines and medication adherence to achieve therapeutic efficacy . contact provider office for questions/concerns as evidenced notation of same in electronic health record through collaboration with PharmD and provider.  .   Interventions: . Inter-disciplinary care team collaboration (see longitudinal plan of care) . Comprehensive medication review performed; medication list updated in electronic medical record  @[email or web address] @  Patient Goals/Self-Care Activities . Over the next 90 days, patient will:  - take medications as prescribed collaborate with provider on medication access solutions  Follow Up Plan: The care management team will reach out to the patient again over the next 90 days.     Task: Mutually Develop and Royce Macadamia Achievement of Patient Goals   Note:   Care Management Activities:    -  verbalization of feelings encouraged    Notes:      Medication Assistance: None required. Patient affirms current coverage meets needs.   Follow up: Agree   Plan: The care management team will reach out to the patient again over the next 90 days.   [name], Pharm.D., Managed Medicaid Pharmacist - [phone]

## 2020-11-09 NOTE — Patient Instructions (Signed)
Visit Information  Elizabeth Phelps was given information about Medicaid Managed Care team care coordination services as a part of their [facility] Medicaid benefit. Elizabeth Phelps verbally consented to engagement with the [facility] Managed Care team.   For questions related to your [facility] health plan, please call: [phone]  If you would like to schedule transportation through your [facility] plan, please call the following number at least 2 days in advance of your appointment: [phone].  Call the [place] at [phone], at any time, 24 hours a day, 7 days a week. If you are in danger or need immediate medical attention call 911.  Elizabeth Phelps - following are the goals we discussed in your visit today:  Goals Addressed   None     Please see education materials related to HTN provided as print materials.   Patient verbalizes understanding of instructions provided today.   The Managed Medicaid care management team will reach out to the patient again over the next 30 days.   Elizabeth Phelps, Pharm.D., Managed Medicaid Pharmacist [phone]   Following is a copy of your plan of care:  Patient Care Plan: General Plan of Care (Adult)    Problem Identified: Health Promotion or Disease Self-Management (General Plan of Care) Resolved 11/07/2020  Priority: High  Onset Date: 07/17/2020    Patient Care Plan: General Plan of Care (Adult)    Problem Identified: Health Promotion or Disease Self-Management (General Plan of Care) Resolved 11/07/2020  Priority: High  Onset Date: 07/17/2020    Goal: Self-Management Plan Developed Completed 11/07/2020  Start Date: 07/17/2020  Expected End Date: 11/07/2020  This Visit's Progress: Not on track  Priority: High  Note:   Current Barriers:  . Chronic Disease Management support and education needs.  Patient presented to [facility] over the weekend via ambulance for RUQ pain, diagnosed with UTI.  RUQ  pain continues. Marland Kitchen Update 11/07/20:  Unable to maintain contact with patient.  Nurse Case Manager Clinical Goal(s):  Marland Kitchen Over the next 30 days, patient will work with PCP to address needs related to RUQ pain. . Over the next 90 days, patient will attend all scheduled medical appointments: . Over the next 30 days patient will work with CM team pharmacist to review medications.  Interventions:  . Inter-disciplinary care team collaboration (see longitudinal plan of care) . Advised patient to contact PCP. Marland Kitchen Reviewed medications with patient. Elizabeth Phelps with Pharmacist regarding medications. . Discussed plans with patient for ongoing care management follow up and provided patient with direct contact information for care management team . Pharmacy referral for medication review.  Patient Goals/Self-Care Activities Over the next 30 days, patient will:  Self administers medications as prescribed Attends all scheduled provider appointments Calls pharmacy for medication refills Calls provider office for new concerns or questions  Follow Up Plan: The Managed Medicaid care management team will reach out to the patient again over the next 7 days.  The patient has been provided with contact information for the Managed Medicaid care management team and has been advised to call with any health related questions or concerns.       Task: Mutually Develop and Elizabeth Phelps Achievement of Patient Goals Completed 11/07/2020  Note:   Care Management Activities:    - verbalization of feelings encouraged    Notes:    Patient Care Plan: Medication Management    Problem Identified: Health Promotion or Disease Self-Management (General Plan of Care)     Goal: Medication Management  Note:   Current Barriers:  . Unable to self administer medications as prescribed . Does not adhere to prescribed medication regimen . Does not maintain contact with provider office . Does not contact provider office for  questions/concerns .   Pharmacist Clinical Goal(s):  Marland Kitchen Over the next 90 days, patient will achieve adherence to monitoring guidelines and medication adherence to achieve therapeutic efficacy . contact provider office for questions/concerns as evidenced notation of same in electronic health record through collaboration with PharmD and provider.  .   Interventions: . Inter-disciplinary care team collaboration (see longitudinal plan of care) . Comprehensive medication review performed; medication list updated in electronic medical record  @[facility] @  Patient Goals/Self-Care Activities . Over the next 90 days, patient will:  - take medications as prescribed collaborate with provider on medication access solutions  Follow Up Plan: The care management team will reach out to the patient again over the next 90 days.     Task: Mutually Develop and Elizabeth Phelps Achievement of Patient Goals   Note:   Care Management Activities:    - verbalization of feelings encouraged    Notes:

## 2020-11-24 ENCOUNTER — Other Ambulatory Visit: Payer: Self-pay

## 2020-11-24 NOTE — Progress Notes (Signed)
Darryl Hyers is still practicing.  [phone].  Have Blondina give her a call and she needs to follow-up with me as well.  Thanks for your help Jill Alexanders

## 2020-11-24 NOTE — Patient Instructions (Signed)
Visit Information  Ms. Trabert was given information about Medicaid Managed Care team care coordination services as a part of their [facility] Medicaid benefit. [name] verbally consented to engagement with the [facility] Managed Care team.   For questions related to your [facility] health plan, please call: [phone] or go here:[email or web address]  If you would like to schedule transportation through your [facility] plan, please call the following number at least 2 days in advance of your appointment: [phone].  Call the [place] at [phone], at any time, 24 hours a day, 7 days a week. If you are in danger or need immediate medical attention call 911.  Ms. Gramajo - following are the goals we discussed in your visit today:  Goals Addressed   None     Please see education materials related to Mental Health provided as print materials.   Patient verbalizes understanding of instructions provided today.   The Managed Medicaid care management team will reach out to the patient again over the next 60 days.   [name], Pharm.D., Managed Medicaid Pharmacist [phone]   Following is a copy of your plan of care:  Patient Care Plan: General Plan of Care (Adult)    Problem Identified: Health Promotion or Disease Self-Management (General Plan of Care) Resolved 11/07/2020  Priority: High  Onset Date: 07/17/2020    Patient Care Plan: General Plan of Care (Adult)    Problem Identified: Health Promotion or Disease Self-Management (General Plan of Care) Resolved 11/07/2020  Priority: High  Onset Date: 07/17/2020    Goal: Self-Management Plan Developed Completed 11/07/2020  Start Date: 07/17/2020  Expected End Date: 11/07/2020  This Visit's Progress: Not on track  Priority: High  Note:   Current Barriers:  . Chronic Disease Management support and education needs.  Patient presented to [facility] over the weekend via  ambulance for RUQ pain, diagnosed with UTI.  RUQ pain continues. Marland Kitchen Update 11/07/20:  Unable to maintain contact with patient.  Nurse Case Manager Clinical Goal(s):  Marland Kitchen Over the next 30 days, patient will work with PCP to address needs related to RUQ pain. . Over the next 90 days, patient will attend all scheduled medical appointments: . Over the next 30 days patient will work with CM team pharmacist to review medications.  Interventions:  . Inter-disciplinary care team collaboration (see longitudinal plan of care) . Advised patient to contact PCP. Marland Kitchen Reviewed medications with patient. [name] with Pharmacist regarding medications. . Discussed plans with patient for ongoing care management follow up and provided patient with direct contact information for care management team . Pharmacy referral for medication review.  Patient Goals/Self-Care Activities Over the next 30 days, patient will:  Self administers medications as prescribed Attends all scheduled provider appointments Calls pharmacy for medication refills Calls provider office for new concerns or questions  Follow Up Plan: The Managed Medicaid care management team will reach out to the patient again over the next 7 days.  The patient has been provided with contact information for the Managed Medicaid care management team and has been advised to call with any health related questions or concerns.       Task: Mutually Develop and Royce Macadamia Achievement of Patient Goals Completed 11/07/2020  Note:   Care Management Activities:    - verbalization of feelings encouraged    Notes:    Patient Care Plan: Medication Management    Problem Identified: Health Promotion or Disease Self-Management (General Plan of Care)  Goal: Medication Management   Note:   Current Barriers:  . Unable to self administer medications as prescribed . Does not adhere to prescribed medication regimen . Does not maintain contact with provider  office . Does not contact provider office for questions/concerns .   Pharmacist Clinical Goal(s):  Marland Kitchen Over the next 90 days, patient will achieve adherence to monitoring guidelines and medication adherence to achieve therapeutic efficacy . contact provider office for questions/concerns as evidenced notation of same in electronic health record through collaboration with PharmD and provider.  .   Interventions: . Inter-disciplinary care team collaboration (see longitudinal plan of care) . Comprehensive medication review performed; medication list updated in electronic medical record  @[facility] @  Patient Goals/Self-Care Activities . Over the next 90 days, patient will:  - take medications as prescribed collaborate with provider on medication access solutions  Follow Up Plan: The care management team will reach out to the patient again over the next 90 days.     Task: Mutually Develop and Royce Macadamia Achievement of Patient Goals   Note:   Care Management Activities:    - verbalization of feelings encouraged    Notes:

## 2020-11-24 NOTE — Patient Outreach (Signed)
Medicaid Managed Care    Pharmacy Note  11/24/2020 Name: Elizabeth Phelps MRN: [id number] DOB: 1956/04/08  Elizabeth Phelps is a 65 y.o. year old female who is a primary care patient of Denita Lung, MD. The [facility] Managed Care Coordination team was consulted for assistance with disease management and care coordination needs.    Engaged with patient Engaged with patient by telephone for initial visit in response to referral for case management and/or care coordination services.  Ms. Hosking was given information about Managed Medicaid Care Coordination team services today. [name] agreed to services and verbal consent obtained.   Objective:  Lab Results  Component Value Date   CREATININE 0.76 03/24/2020   CREATININE 0.79 06/10/2019   CREATININE 0.56 06/11/2018    No results found for: HGBA1C     Component Value Date/Time   CHOL 211 (H) 03/24/2020 1559   TRIG 117 03/24/2020 1559   HDL 69 03/24/2020 1559   CHOLHDL 3.1 03/24/2020 1559   CHOLHDL 3.5 11/05/2016 0736   VLDL 23 11/05/2016 0736   LDLCALC 121 (H) 03/24/2020 1559    Other: (TSH, CBC, Vit D, etc.)  Clinical ASCVD: No  The 10-year ASCVD risk score [name] DC Jr., et al., 2013) is: 8%   Values used to calculate the score:     Age: 43 years     Sex: Female     Is Non-Hispanic African American: No     Diabetic: No     Tobacco smoker: Yes     Systolic Blood Pressure: 462 mmHg     Is BP treated: No     HDL Cholesterol: 69 mg/dL     Total Cholesterol: 211 mg/dL    Other: (CHADS2VASc if Afib, PHQ9 if depression, MMRC or CAT for COPD, ACT, DEXA)  BP Readings from Last 3 Encounters:  07/21/20 122/80  04/04/20 113/75  03/24/20 110/74    Assessment/Interventions: Review of patient past medical history, allergies, medications, health status, including review of consultants reports, laboratory and other test data, was performed as part of comprehensive evaluation and provision of chronic care management  services.    Mood Gad 7 : 11/24/20: 21 Paroxetine Tried/Failed: Aripiprazole (messed with sleep cycle and amped her up) Feb 2022: Wants to see Page (Other Mental Health person) next week to make sure Abilify is OK -Went to [employer] (Referred from neuro), upset that Psyc put her on Abilify after 30 minutes of speaking while dr's 60 year old kid screamed in the background Plan: Did not pressure patient to take medication. Told her risks, ADR' and recommended she run it by Page and her PCP whom she has a great relationship with both June 2022: Specialist increased to 40mg  Paroxitine, patient states it helps a little but that the thing that helps the best is Marijana. She is adamant that she doesn't want to quit. Patient kept talking about "[employer]" and how she isn't going to be a [place] pig. Is open to getting a new counselor, will coordinate with PCP to get someone to talk with. Woman named "Darryl" in 2006 was who patient really liked. Patient speaks very highly of Dr. Redmond School.     Lipids Lab Results  Component Value Date   CHOL 211 (H) 03/24/2020   CHOL 178 06/10/2019   CHOL 154 09/22/2017   Lab Results  Component Value Date   HDL 69 03/24/2020   HDL 56 06/10/2019   HDL 43 09/22/2017   Lab Results  Component Value Date   LDLCALC 121 (H) 03/24/2020   LDLCALC 105 (H) 06/10/2019   LDLCALC 70 09/22/2017   Lab Results  Component Value Date   TRIG 117 03/24/2020   TRIG 96 06/10/2019   TRIG 206 (H) 09/22/2017   Lab Results  Component Value Date   CHOLHDL 3.1 03/24/2020   CHOLHDL 3.2 06/10/2019   CHOLHDL 3.6 09/22/2017   No results found for: LDLDIRECT Atorvastatin 40mg  Plan: At goal after PCP increased in Feb 2022   Rejection  Azathioprine Plan: At goal,  patient stable/ symptoms controlled   PBC Alvino Blood ([place] from [facility] because no one in [place] can get it) Plan: At goal,  patient stable/ symptoms controlled   Pain Tramadol  (Not taking anymore)  Migraines -Smokes Pot for migraine Zolmitriptan Plan: At goal,  patient stable/ symptoms controlled   Meds: -Likes going to Pharmacy because only time to go out  SDOH (Social Determinants of Health) assessments and interventions performed:    Care Plan  Allergies  Allergen Reactions  . Codeine Nausea And Vomiting    Medications Reviewed Today    Reviewed by Salley Slaughter, NP (Nurse Practitioner) on 09/07/20 at 1037  Med List Status: <None>  Medication Order Taking? Sig Documenting Provider Last Dose Status Informant  atorvastatin (LIPITOR) 40 MG tablet [id number]  Take 1 tablet (40 mg total) by mouth daily. Denita Lung, MD  Active   azaTHIOprine (IMURAN) 50 MG tablet [id number] No Take 50 mg by mouth daily. Taking a total of 100mg  daily [provider] Taking Active   Brexpiprazole (REXULTI) 0.5 MG TABS [id number] Yes Take 1 tablet (0.5 mg total) by mouth daily. Salley Slaughter, NP  Active   cephALEXin (KEFLEX) 500 MG capsule [id number] No Take 500 mg by mouth 3 (three) times daily. For 7 days.  Patient not taking: Reported on 08/03/2020   [provider] Not Taking Active Self  Multiple Vitamin (MULTIVITAMIN WITH MINERALS) TABS [id number] No Take 1 tablet by mouth daily. [provider] Taking Active Self  OCALIVA 10 MG TABS [id number] No Take by mouth. [provider] Taking Active   OCALIVA 5 MG TABS [id number] No   Patient not taking: No sig reported   [provider] Not Taking Active   Omega-3 Fatty Acids (FISH OIL PO) [id number] No Take by mouth daily.  Patient not taking: No sig reported   [provider] Not Taking Active   PARoxetine (PAXIL) 40 MG tablet [id number]  Take 1 tablet (40 mg total) by mouth daily. Salley Slaughter, NP  Active   predniSONE (DELTASONE) 10 MG tablet [id number] No Take 5 mg by mouth daily.   Patient not taking: No sig reported   [provider] Not Taking  Active   zolmitriptan (ZOMIG) 5 MG tablet [id number] No Take 1 tablet (5 mg total) by mouth as needed for migraine. Denita Lung, MD Taking Active           Patient Active Problem List   Diagnosis Date Noted  . Diverticulosis 07/21/2020  . Major depressive disorder, recurrent episode, mild ([place]) 12/18/2019  . Anxiety disorder due to medical condition 12/18/2019  . Cerebral aneurysm without rupture   . Autoimmune hepatitis ([place]) 08/04/2018  . Atherosclerosis of aorta ([place]) 10/02/2017  . Incomplete right bundle branch block 09/22/2017  . Arnold-Chiari malformation, type I ([place]) 08/12/2016  . Hyperlipidemia LDL goal <100 08/12/2016  . History of colonic polyps 08/12/2016  . Current smoker  05/25/2013  . Migraine headache 01/08/2013    Conditions to be addressed/monitored: Depression   Care Plan : Medication Management  Updates made by Lane Hacker, [place] since 08/03/2020 12:00 AM    Problem: Health Promotion or Disease Self-Management (General Plan of Care)     Goal: Medication Management   Note:   Current Barriers:  . Unable to self administer medications as prescribed . Does not adhere to prescribed medication regimen . Does not maintain contact with provider office . Does not contact provider office for questions/concerns .   Pharmacist Clinical Goal(s):  Marland Kitchen Over the next 90 days, patient will achieve adherence to monitoring guidelines and medication adherence to achieve therapeutic efficacy . contact provider office for questions/concerns as evidenced notation of same in electronic health record through collaboration with PharmD and provider.  .   Interventions: . Inter-disciplinary care team collaboration (see longitudinal plan of care) . Comprehensive medication review performed; medication list updated in electronic medical record  @[facility] @  Patient Goals/Self-Care Activities . Over the next 90 days, patient will:  - take medications as  prescribed collaborate with provider on medication access solutions  Follow Up Plan: The care management team will reach out to the patient again over the next 90 days.     Task: Mutually Develop and Royce Macadamia Achievement of Patient Goals   Note:   Care Management Activities:    - verbalization of feelings encouraged    Notes:      Medication Assistance: None required. Patient affirms current coverage meets needs.   Follow up: Agree   Plan: The care management team will reach out to the patient again over the next 90 days.   [name], Pharm.D., Managed Medicaid Pharmacist - [phone]

## 2020-12-05 ENCOUNTER — Telehealth (HOSPITAL_COMMUNITY): Payer: Medicaid Other | Admitting: Psychiatry

## 2020-12-05 DIAGNOSIS — K743 Primary biliary cirrhosis: Secondary | ICD-10-CM | POA: Insufficient documentation

## 2020-12-06 DIAGNOSIS — K7469 Other cirrhosis of liver: Secondary | ICD-10-CM | POA: Diagnosis not present

## 2020-12-06 DIAGNOSIS — K754 Autoimmune hepatitis: Secondary | ICD-10-CM | POA: Diagnosis not present

## 2020-12-06 DIAGNOSIS — K743 Primary biliary cirrhosis: Secondary | ICD-10-CM | POA: Diagnosis not present

## 2020-12-08 ENCOUNTER — Ambulatory Visit: Payer: Self-pay

## 2020-12-11 DIAGNOSIS — K743 Primary biliary cirrhosis: Secondary | ICD-10-CM | POA: Diagnosis not present

## 2020-12-11 DIAGNOSIS — K754 Autoimmune hepatitis: Secondary | ICD-10-CM | POA: Diagnosis not present

## 2020-12-12 ENCOUNTER — Other Ambulatory Visit: Payer: Self-pay

## 2020-12-12 ENCOUNTER — Ambulatory Visit (INDEPENDENT_AMBULATORY_CARE_PROVIDER_SITE_OTHER): Payer: Medicaid Other | Admitting: Neurology

## 2020-12-12 ENCOUNTER — Encounter: Payer: Self-pay | Admitting: Neurology

## 2020-12-12 ENCOUNTER — Other Ambulatory Visit: Payer: Self-pay | Admitting: Nurse Practitioner

## 2020-12-12 VITALS — BP 105/72 | HR 81 | Ht 64.0 in | Wt 137.0 lb

## 2020-12-12 DIAGNOSIS — K754 Autoimmune hepatitis: Secondary | ICD-10-CM

## 2020-12-12 DIAGNOSIS — F411 Generalized anxiety disorder: Secondary | ICD-10-CM | POA: Diagnosis not present

## 2020-12-12 DIAGNOSIS — G43109 Migraine with aura, not intractable, without status migrainosus: Secondary | ICD-10-CM | POA: Diagnosis not present

## 2020-12-12 DIAGNOSIS — G3184 Mild cognitive impairment, so stated: Secondary | ICD-10-CM

## 2020-12-12 DIAGNOSIS — I671 Cerebral aneurysm, nonruptured: Secondary | ICD-10-CM | POA: Diagnosis not present

## 2020-12-12 DIAGNOSIS — K743 Primary biliary cirrhosis: Secondary | ICD-10-CM

## 2020-12-12 NOTE — Patient Instructions (Addendum)
Referral will be sent to Neurointerventional specialist to continue follow-up for aneurysm  2. Continue follow-up with Behavioral Health  3. Continue as needed Zomig for migraines  4. Follow-up in 6-8 months, call for any changes

## 2020-12-12 NOTE — Progress Notes (Signed)
NEUROLOGY FOLLOW UP OFFICE NOTE  Elizabeth Phelps [id number] 06-15-1956  HISTORY OF PRESENT ILLNESS: I had the pleasure of seeing Elizabeth Phelps in follow-up in the neurology clinic on 12/12/2020.  The patient was last seen 8 months ago for memory loss. She is alone in the office today. Records and images were personally reviewed where available.  She has a history of right MCA aneurysm s/p craniotomy in 2002. Interval MRA head done 05/2020 did not show any new changes with redemonstrated fusiform aneurysm of the right M1 segment with a proximal blister-like component measuring approximately 61mm at the dome and a larger distal M1 saccular component measuring approximately 8x33mm, stable since 2027. It was noted that this aneurysm may be amenable to endovascular reconstruction.  She had rescheduled her Neuropsychological evaluation to next week. Prior testing indicated possible ADHD and significant psychiatric distress as likely causes of cognitive deficits, she sees [facility]. She is very anxious today, with constant leg bouncing/shaking during the visit, pressured speech. She states she is "all over the place, my brain is a hot mess right now." She reports anxiety is "through the roof," she has seen Psychiatry and Paxil was increased to 40mg  daily. She felt "brain fog" with Abilify and was switched to [place], but "I was everywhere, I told her I can't take this." She states smoking marijuana helps with her anxiety. She refused to take a different medication, then states "I can't feel what I'm feeling right now." She wants to lear how to control her anxiety. She states she cannot get her brain to cut off, even at night. She has prn Xanax from last year, it helps her sleep but until noon the next day. She states she is not driving, she was getting lost so her children took the keys away. She worries about money, and states she cannot walk away from the TV, "[employer] is my drug." She feels the  frustration is what is causing her anxiety, "the switch is broke." She is dealing with family issues as well. She tries to do the breathing exercises she learned from the headache clinic. When she has a migraine, she usually starts with hot coffee and a cold rag on her face. Zomig is her last resort, she has to stay in bed if she takes it, otherwise she feels like she would fall. She states she just wants to work, but lost 3 jobs due to her anxiety. She says "I don't like change, change messes me up."    History on Initial Assessment 09/08/2018: This is a pleasant 65 year old right-handed woman with a history of hyperlipidemia, tobacco use, anxiety, migraine with aura, right MCA aneurysm s/p craniotomy, presenting for evaluation of worsening memory. She started having memory issues after her brain surgery in 2002, however she continued to function quite well and states she has always been on top of things until around October 2019. At that time, she started noticing word-finding difficulties, Elizabeth Phelps has noticed this as well, she would stop mid-sentence unable to remember a word. She would hear something but would not comprehend what people are saying, asking them to repeat it several times. She feels like her brain is "short-circuiting." She reports her long-term memory is "awesome" but she cannot remember what she ate yesterday. She has gotten lost driving and uses her GPS all the time even for familiar roads. 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 There were 3 days last week where she was driving to work then she suddenly realizes she is there but does not remember the trip. Elizabeth Phelps was talking to her one time while patient was driving then realized she was at work, and did not notice any confusion. 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 She states numbers have always been her thing but now she cannot do math anymore. She lives with 2 roommates who were the first ones to notice how forgetful she has been. She has been told she is "off somewhere" or "zoning out" and would need to be called a couple of times to get her attention. She has noticed she gets frustrated and agitated more easily. She was anxious after her brain surgery in 2002 and was started on Paxil which helped, she does not feel it is helping much now.  She has also started having involuntary left hand shaking lasting a few minutes. Leg is unaffected. There is no associated speech difficulty, confusion, or weakness when she has the shaking. She has numbness and tingling in her finger tips. She has been having more headaches since October, with good response to Excedrin. She has dizziness when she gets up from bed in the morning or in the middle of the night, this is when she usually falls. Last fall was a week ago. She has noticed that if she looks at something sometimes, she sees something different, then looks again and sees what the object actually is. No olfactory/gustatory hallucinations. She has nausea when dizzy, but also has waves of nausea throughout the day. Sleep is fine most of the time. She has not woken up with tongue bite or incontinence. Her mother had memory issues after several strokes. Her father had a history of cerebral aneurysm as well. She had a head injury in the 1980s. No alcohol use. There is no family history of seizures.   MRI brain without contrast in January 2017 showed encephalomalacia of the right temporal lobe, right lateral lenticulostriate territory chronic infarct affecting the external capsule, periventricular white matter, patulous right temporal horn. Cerebellar tonsils are 53mm below foramen magnum. MRA head showed combination fusiform and saccular aneurysm of the right MCA throughout M1  segment, appearing similar in size to angiogram in 2002.   Repeat MRI/MRA brain in 05/2019 showed no acute changes. There were post-op changes from right pterional craniotomy with underlying chronic post-operative and/or ischemic encephalomalacia in the right temporal lobe and right basal ganglia. There is a Chiari 1 malfomation with tonsils extending up to 64mm through foramen magnum, stable from prior. There is diffuse atrophy and chronic microvascular disease, mildly progressed from 2017. MRA showed combination of fusiform and saccular right MCA aneurysm originating from the right M1 segment, similar to 2017 imaging.   Neurocognitive testing was done in March 2021. There was performance variability as well as concerns surrounding validity of lower test scores. Deficits in visuoperceptual functioning are consistent with history of right temporal lobe encephalomalacia. There was no pronounced right frontal cognitive dysfunction seen. There was variability across processing speed, executive functioning, and visuospatial abilities, as well as learning and memory. She scored moderate range for acute symptoms of anxiety and depression. She also scored in the "likely to have ADHD" range across a childhood assessment of inattentive symptoms. It was noted that "There  is the potential that the primary etiology of her cognitive deficits surrounds underlying traits of ADHD, exacerbated by significant psychiatric distress. This could manifest in trouble with processing speed and receptive language, worsening her ability to problem solve, adapt to changing cognitive demands, and learn novel information. However, I cannot rule out an underlying cognitive disorder at the present time."   EEG done 06/2019 showed occasional slowing over the right frontal and temporal regions.   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 90 tablet 3   azaTHIOprine (IMURAN) 50 MG tablet Take 50 mg by mouth daily. Taking a total of 100mg  daily     Brexpiprazole (REXULTI) 0.5 MG TABS Take 1 tablet (0.5 mg total) by mouth daily. 30 tablet 2   cephALEXin (KEFLEX) 500 MG capsule Take 500 mg by mouth 3 (three) times daily. For 7 days. (Patient not taking: Reported on 08/03/2020)     Multiple Vitamin (MULTIVITAMIN WITH MINERALS) TABS Take 1 tablet by mouth daily.     OCALIVA 10 MG TABS Take by mouth.     OCALIVA 5 MG TABS  (Patient not taking: No sig reported)     Omega-3 Fatty Acids (FISH OIL PO) Take by mouth daily. (Patient not taking: No sig reported)     PARoxetine (PAXIL) 40 MG tablet Take 1 tablet (40 mg total) by mouth daily. 30 tablet 2   predniSONE (DELTASONE) 10 MG tablet Take 5 mg by mouth daily.  (Patient not taking: No sig reported)     zolmitriptan (ZOMIG) 5 MG tablet Take 1 tablet (5 mg total) by mouth as needed for migraine. 10 tablet 11   No current facility-administered medications on file prior to visit.    ALLERGIES: Allergies  Allergen Reactions   Codeine Nausea And Vomiting    FAMILY HISTORY: Family History  Problem Relation Age of Onset   Stroke Mother     Hypertension Mother    Prostate cancer Father        mets   Aneurysm Father    Breast cancer Maternal Grandmother     SOCIAL HISTORY: Social History   Socioeconomic History   Marital status: Divorced    Spouse name: Not on file   Number of children: 2   Years of education: 12   Highest education level: High school graduate  Occupational History   Occupation: Unemployed    Comment: Prior customer service  Tobacco Use   Smoking status: Every Day    Packs/day: 0.75    Pack years: 0.00    Types: Cigarettes   Smokeless tobacco: Never  Vaping Use   Vaping Use: Never used  Substance  and Sexual Activity   Alcohol use: No   Drug use: Yes    Types: Marijuana    Comment: Occasionally to help with migraine headaches   Sexual activity: Not Currently  Other Topics Concern   Not on file  Social History Narrative   Right handed   Highest level of edu- HS   Lives in one story home with two roommates   Social Determinants of Health   Financial Resource Strain: Not on file  Food Insecurity: Not on file  Transportation Needs: Not on file  Physical Activity: Not on file  Stress: Not on file  Social Connections: Not on file  Intimate Partner Violence: Not on file     PHYSICAL EXAM: Vitals:   12/12/20 1004  BP: 105/72  Pulse: 81  SpO2: 97%   General: No acute distress, very anxious in the office today with pressure speech, constant leg bouncing/shaking  Head:  Normocephalic/atraumatic Skin/Extremities: No rash, no edema Neurological Exam: alert and awake. No aphasia or dysarthria. Fund of knowledge is appropriate.  Intact fluency and comprehension. Attention and concentration are normal.   Cranial nerves: Pupils equal, round. Extraocular movements intact.  No facial asymmetry.  Motor: moves all extremities symmetrically. Gait narrow-based and steady   IMPRESSION: This is a 65 yo RH woman with a history of  hyperlipidemia, tobacco use, anxiety, migraine with aura, right MCA  aneurysm s/p craniotomy, who presented with memory changes and headaches. MRI brain no acute changes, there is right temporal encephalomalacia, stable aneurysm on follow-up MRA on 05/2020. There is a fusiform aneurysm of the right M1 segment with a proximal blister-like component measuring approximately 38mm at the dome and a larger distal M1 saccular component measuring approximately 8x91mm, stable since 2027. It was noted that this aneurysm may be amenable to endovascular reconstruction, referral to Neurointerventional Radiology will be done. She continues to report memory changes, however she is significantly anxious in the office today, which will likely affect/produce similar results on upcoming Neuropsychological evaluation. She was advised to follow closely with Behavioral health, especially her psychotherapist. Headaches overall stable, she has prn Zomig. Follow-up in 6-8 months, call for any changes.    Thank you for allowing me to participate in her care.  Please do not hesitate to call for any questions or concerns.    Ellouise Newer, M.D.   CC: Dr. Redmond School

## 2020-12-13 ENCOUNTER — Other Ambulatory Visit (HOSPITAL_COMMUNITY): Payer: Self-pay | Admitting: Psychiatry

## 2020-12-13 DIAGNOSIS — F331 Major depressive disorder, recurrent, moderate: Secondary | ICD-10-CM

## 2020-12-13 DIAGNOSIS — F411 Generalized anxiety disorder: Secondary | ICD-10-CM

## 2020-12-14 NOTE — Patient Outreach (Signed)
Patient called me and told me she still hadn't heard about the counselor, told her again to call Dr. Redmond School to get F/U so he can refer

## 2020-12-16 ENCOUNTER — Ambulatory Visit
Admission: RE | Admit: 2020-12-16 | Discharge: 2020-12-16 | Disposition: A | Discharge status: Home / Self Care | Payer: Medicaid Other | Source: Ambulatory Visit | Attending: Family Medicine | Admitting: Family Medicine

## 2020-12-16 ENCOUNTER — Other Ambulatory Visit: Payer: Self-pay

## 2020-12-18 ENCOUNTER — Telehealth (INDEPENDENT_AMBULATORY_CARE_PROVIDER_SITE_OTHER): Payer: Medicare Other | Admitting: Family Medicine

## 2020-12-18 ENCOUNTER — Telehealth: Payer: Self-pay | Admitting: Neurology

## 2020-12-18 ENCOUNTER — Encounter: Payer: Self-pay | Admitting: Family Medicine

## 2020-12-18 ENCOUNTER — Other Ambulatory Visit: Payer: Self-pay

## 2020-12-18 ENCOUNTER — Institutional Professional Consult (permissible substitution): Payer: Medicaid Other | Admitting: Family Medicine

## 2020-12-18 VITALS — BP 105/72 | Wt 137.0 lb

## 2020-12-18 DIAGNOSIS — F330 Major depressive disorder, recurrent, mild: Secondary | ICD-10-CM | POA: Diagnosis not present

## 2020-12-18 DIAGNOSIS — I671 Cerebral aneurysm, nonruptured: Secondary | ICD-10-CM

## 2020-12-18 DIAGNOSIS — G43109 Migraine with aura, not intractable, without status migrainosus: Secondary | ICD-10-CM

## 2020-12-18 NOTE — Telephone Encounter (Signed)
Pls send referral to Dr. Estanislado Pandy with [facility] (fax [phone]), dx: cerebral aneurysm, thanks

## 2020-12-18 NOTE — Telephone Encounter (Signed)
Pt called in stating when she had her last visit, Dr. Delice Lesch mentioned she needs to see a vascular doctor, but she hasn't heard from anyone about it.

## 2020-12-18 NOTE — Progress Notes (Addendum)
   Subjective:    Patient ID: Elizabeth Phelps, female    DOB: March 16, 1956, 65 y.o.   MRN: [id number]  HPI Documentation for virtual audio and video telecommunications through [place] encounter: The patient was located at home. 2 patient identifiers used.  The provider was located in the office. The patient did consent to this visit and is aware of possible charges through their insurance for this visit. The other persons participating in this telemedicine service were none. Time spent on call was 1020 minutes and in review of previous records >20 minutes total for counseling and coordination of care. This virtual service is not related to other E/M service within previous 7 days.  She is seeing Dr. Delice Lesch for evaluation and treatment of migraine headache and is scheduled for some more testing.  Apparently Dr. Delice Lesch mention talk to a neurosurgeon about possible procedure for her fusiform aneurysm.  She is also being seen by [facility] and recently had her Paxil increased to 40 mg.  She was given Rexulti that she apparently did not tolerate well.  Apparently another medication was offered but she refused.  She is not comfortable with the therapist that she has.  Review of Systems     Objective:   Physical Exam Alert and in no distress with appropriate affect.  Dressed appropriately.  PHQ of 10     Assessment & Plan:  Major depressive disorder, recurrent episode, mild (HCC)  Cerebral aneurysm without rupture  Migraine with aura and without status migrainosus, not intractable I discussed various options with her and recommend she call [facility] to see if they can help with counseling.  She will continue to see Dr. Delice Lesch and consider seeing neurosurgeon concerning the aneurysm and possible new techniques.

## 2020-12-19 ENCOUNTER — Encounter: Payer: Self-pay | Admitting: Psychology

## 2020-12-19 ENCOUNTER — Ambulatory Visit (INDEPENDENT_AMBULATORY_CARE_PROVIDER_SITE_OTHER): Payer: Medicare Other | Admitting: Psychology

## 2020-12-19 ENCOUNTER — Ambulatory Visit: Payer: Medicaid Other | Admitting: Psychology

## 2020-12-19 DIAGNOSIS — F411 Generalized anxiety disorder: Secondary | ICD-10-CM

## 2020-12-19 DIAGNOSIS — F331 Major depressive disorder, recurrent, moderate: Secondary | ICD-10-CM | POA: Diagnosis not present

## 2020-12-19 DIAGNOSIS — R4189 Other symptoms and signs involving cognitive functions and awareness: Secondary | ICD-10-CM

## 2020-12-19 DIAGNOSIS — R41840 Attention and concentration deficit: Secondary | ICD-10-CM | POA: Diagnosis not present

## 2020-12-19 NOTE — Progress Notes (Signed)
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 to the best of her abilities and exhibited adequate task engagement and persistence. Scores across stand-alone and embedded performance validity measures were variable but largely within expectation. As such, the results of the current evaluation are believed to be a valid representation of Ms. Ressel's current cognitive functioning.  Test Results: Ms. Cookson was largely oriented at the time of the current evaluation. Points were lost for her being unable to state the current date.   Intellectual abilities based upon educational and vocational attainment were estimated to be in the average range. Premorbid abilities were estimated to be within the average range based upon a single-word reading test.   Processing speed was below average to average. Basic attention was above average. More complex attention (e.g., working memory) was average. Executive functioning was mildly variable but overall adequate, ranging from the below average to above average normative ranges.  Assessed receptive language abilities were  average. Likewise, Ms. Boutelle did not exhibit any difficulties comprehending task instructions and answered all questions asked of her appropriately. Assessed expressive language was well below average across verbal fluency tasks but above average across a confrontation naming task.     Assessed visuospatial/visuoconstructional abilities were average to exceptionally high. Points were lost on her drawing of a clock due to no size differentiation between clock hands.   Fine motor coordination/speed was well below average when using her dominant (right) hand and below average when using her non-dominant (left) hand.    Learning (i.e., encoding) of novel verbal and visual information was below average to average across verbal tasks and well below average across a visual task. Spontaneous delayed recall (i.e., retrieval) of previously learned information was below average to average. Retention rates were 95% across a story learning task, 90% across a list learning task, and 100% across a shape learning task. Performance across recognition tasks was below average to average, suggesting evidence for information consolidation.   Results of emotional screening instruments suggested that recent symptoms of generalized anxiety were in the moderate range, while symptoms of depression were also within the moderate range. A screening instrument assessing recent sleep quality suggested the presence of moderate sleep dysfunction.  Tables of Scores:   Note: This summary of test scores accompanies the interpretive report and should not be considered in isolation without reference to the appropriate sections in the text. Descriptors are based on appropriate normative data and Phelps be adjusted based on clinical judgment. Terms such as "Within  Expectation," "Within Normal Limits," and "Outside Normal Limits" are used when a more specific description of the test score cannot be determined. Descriptors refer to the current  evaluation only.         Validity:    DESCRIPTOR   March 2021 Current    ACS Word Choice: --- --- --- Outside Normal Limits  HVLT-R Recognition Discrimination Index: --- --- --- Within Expectation  BVMT-R Retention Percentage: --- --- --- Within Expectation  D-KEFS Color Word Effort Index: --- --- --- Within Expectation        Orientation:       Raw Score Raw Score Percentile   NAB Orientation, Form  28/29 27/29 --- ---        Intellectual Functioning:             Standard Score Standard Score Percentile   Barona Formula Estimated Premorbid IQ --- 96 39 Average         Standard Score Standard Score Percentile   Test of Premorbid Functioning: 92 98 45 Average        Memory:            Wechsler Memory Scale (WMS-IV):                       Raw Score (Scaled Score) Raw Score (Scaled Score) Percentile     Logical Memory I 16/50 (6) 22/50 (9) 37 Average    Logical Memory II 16/50 (8) 21/50 (10) 50 Average    Logical Memory Recognition 24/30 24/30 --- Average        Hopkins Verbal Learning Test (HVLT-R), Form 1: Raw Score (T Score) Raw Score (T Score) Percentile     Total Trials 1-3 17/36 (28) 22/36 (40) 16 Below Average    Delayed Recall 4/12 (21) 9/12 (47) 38 Average    Recognition Discrimination Index 6 (17) 10 (45) 31 Average      True Positives 9 10 --- ---      False Positives 3 0 --- ---         Brief Visuospatial Memory Test (BVMT-R), Form 1: Raw Score (T Score) Raw Score (T Score) Percentile     Total Trials 1-3 13/36 (34) 14/36 (36) 8 Well Below Average    Delayed Recall 7/12 (44) 6/12 (40) 16 Below Average    Recognition Discrimination Index 2 5 --- Below Average      Recognition Hits 3/6 5/6 --- Below Average      False Positive Errors 1 0 --- Within Normal Limits         Attention/Executive Function:            Trail Making Test (TMT): Raw Score (T Score) Raw Score (T Score) Percentile     Part A 35 secs.,  0 errors (47) 37 secs.,  0 errors (49) 46 Average     Part B 104 secs.,  1 error (45) 84 secs.,  0 errors (51) 54 Average          Scaled Score Scaled Score Percentile   WAIS-IV Coding: '6 7 16 ' Below Average        NAB Attention Module, Form : T Score T Score Percentile     Digits Forward 49 60 84 Above Average    Digits Backwards 45 46 34 Average        D-KEFS Color-Word Interference Test: Raw Score (Scaled Score) Raw Score (Scaled Score) Percentile     Color Naming 43 secs. (  6) 41 secs. (6) 9 Below Average    Word Reading 38 secs. (3) 27 secs. (9) 37 Average    Inhibition 87 secs. (7) 84 secs. (7) 16 Below Average      Total Errors 0 errors (12) 1 error (11) 63 Average    Inhibition/Switching 112 secs. (5) 103 secs. (6) 9 Below Average      Total Errors 1 error (12) 0 errors (13) 84 Above Average        D-KEFS Verbal Fluency Test: Raw Score (Scaled Score) Raw Score (Scaled Score) Percentile     Letter Total Correct 23 (6) 25 (7) 16 Below Average    Category Total Correct 30 (8) 26 (6) 9 Below Average    Category Switching Total Correct 12 (9) 12 (9) 37 Average    Category Switching Accuracy 11 (10) 11 (10) 50 Average      Total Set Loss Errors 3 (9) 0 (13) 84 Above Average      Total Repetition Errors 2 (11) 1 (12) 75 Above Average        D-KEFS Design Fluency Test: Raw Score (Scaled Score) Raw Score (Scaled Score) Percentile     Condition 1 - Filled Dots 8 (10) 8 (10) 50 Average    Condition 2 - Empty Dots 9 (10) 9 (10) 50 Average    Condition 3 - Switching 6 (10) 5 (9) 37 Average      Total Set Loss Errors 3 (10) 2 (11) 63 Average      Total Repetition Errors 2 (12) 1 (13) 84 Above Average        D-KEFS 20 Questions Test: Scaled Score Scaled Score Percentile     Total Weighted Achievement Score 15 13 84 Above Average    Initial Abstraction Score 11 16 98 Exceptionally High        Language:            Verbal Fluency Test: Raw Score (T Score) Raw Score (T Score) Percentile     Phonemic Fluency (FAS) 23 (35) 25 (35)  7 Well Below Average    Animal Fluency 15 (42) 12 (35) 7 Well Below Average         NAB Language Module, Form : T Score T Score Percentile     Auditory Comprehension 37 46 34 Average    Naming 28/31 (39) 31/31 (58) 79 Above Average        Visuospatial/Visuoconstruction:       Raw Score Raw Score Percentile   Clock Drawing: 10/10 9/10 --- Within Normal Limits        NAB Spatial Module, Form : T Score T Score Percentile     Figure Drawing Copy 50 73 99 Exceptionally High    Figure Drawing Immediate Recall 46 49 46 Average         Scaled Score Scaled Score Percentile   WAIS-IV Block Design: 7 9 37 Average  WAIS-IV Visual Puzzles: '7 8 25 ' Average        Sensory-Motor:            [place] Grooved Pegboard Test: Raw Score Raw Score Percentile     Dominant Hand 97 secs.,  2 drops 110 secs.,  0 drops  5 Well Below Average    Non-Dominant Hand 88 secs.,  0 drops 105 secs.,  1 drops  14 Below Average        Mood and Personality:       Raw Score Raw Score Percentile  Beck Depression Inventory - II: 24 26 --- Moderate  PROMIS Anxiety Questionnaire: 23 25 --- Moderate        Additional Questionnaires:            Adult Self-Report Scale (Childhood): Raw Score Raw Score Percentile     Inattention 20 22 --- Likely to have ADHD    Hyperactive/Impulsive 13 12 --- Unlikely to have ADHD        PROMIS Sleep Disturbance Questionnaire: 14 36 --- Moderate   Informed Consent and Coding/Compliance:   The current evaluation represents a clinical evaluation for the purposes previously outlined by the referral source and is in no way reflective of a forensic evaluation.   Ms. Wheeless was provided with a verbal description of the nature and purpose of the present neuropsychological evaluation. Also reviewed were the foreseeable risks and/or discomforts and benefits of the procedure, limits of confidentiality, and mandatory reporting requirements of this provider. The patient was given the  opportunity to ask questions and receive answers about the evaluation. Oral consent to participate was provided by the patient.   This evaluation was conducted by Christia Reading, Ph.D., licensed clinical neuropsychologist. Ms. Double completed a clinical interview with Dr. Melvyn Novas, billed as one unit [phone], and 140 minutes of cognitive testing and scoring, billed as one unit [phone] and four additional units [id number]. Psychometrist Milana Kidney, B.S., assisted Dr. Melvyn Novas with test administration and scoring procedures. As a separate and discrete service, Dr. Melvyn Novas spent a total of 160 minutes in interpretation and report writing billed as one unit [phone] and two units 96133.

## 2020-12-19 NOTE — Progress Notes (Signed)
   Psychometrician Note   Cognitive testing was administered to Elizabeth Phelps by Elizabeth Phelps, B.S. (psychometrist) under the supervision of Dr. Christia Reading, Ph.D., licensed psychologist on 12/19/20. Elizabeth Phelps not appear overtly distressed by the testing session per behavioral observation or responses across self-report questionnaires. Rest breaks were offered.    The battery of tests administered was selected by Dr. Christia Reading, Ph.D. with consideration to Elizabeth Phelps current level of functioning, the nature of her symptoms, emotional and behavioral responses during interview, level of literacy, observed level of motivation/effort, and the nature of the referral question. This battery was communicated to the psychometrist. Communication between Dr. Christia Reading, Ph.D. and the psychometrist was ongoing throughout the evaluation and Dr. Christia Reading, Ph.D. was immediately accessible at all times. Dr. Christia Reading, Ph.D. provided supervision to the psychometrist on the date of this service to the extent necessary to assure the quality of all services provided.    Elizabeth Phelps will return within approximately 1-2 weeks for an interactive feedback session with Dr. Melvyn Novas at which time her test performances, clinical impressions, and treatment recommendations will be reviewed in detail. Elizabeth Phelps understands she can contact our office should she require our assistance before this time.  A total of 140 minutes of billable time were spent face-to-face with Elizabeth Phelps by the psychometrist. This includes both test administration and scoring time. Billing for these services is reflected in the clinical report generated by Dr. Christia Reading, Ph.D.  This note reflects time spent with the psychometrician and does not include test scores or any clinical interpretations made by Dr. Melvyn Novas. The full report will follow in a separate note.

## 2020-12-20 ENCOUNTER — Other Ambulatory Visit (HOSPITAL_COMMUNITY): Payer: Self-pay | Admitting: Interventional Radiology

## 2020-12-20 DIAGNOSIS — I671 Cerebral aneurysm, nonruptured: Secondary | ICD-10-CM

## 2020-12-20 NOTE — Telephone Encounter (Signed)
Referral has been created and faxed.

## 2020-12-21 ENCOUNTER — Ambulatory Visit (HOSPITAL_COMMUNITY)
Admission: RE | Admit: 2020-12-21 | Discharge: 2020-12-21 | Disposition: A | Discharge status: Home / Self Care | Payer: Medicaid Other | Source: Ambulatory Visit | Attending: Interventional Radiology | Admitting: Interventional Radiology

## 2020-12-21 ENCOUNTER — Other Ambulatory Visit: Payer: Self-pay

## 2020-12-21 DIAGNOSIS — I671 Cerebral aneurysm, nonruptured: Secondary | ICD-10-CM

## 2020-12-22 HISTORY — PX: IR RADIOLOGIST EVAL & MGMT: IMG5224

## 2020-12-27 ENCOUNTER — Ambulatory Visit (INDEPENDENT_AMBULATORY_CARE_PROVIDER_SITE_OTHER): Payer: Medicare Other | Admitting: Psychology

## 2020-12-27 ENCOUNTER — Other Ambulatory Visit: Payer: Self-pay

## 2020-12-27 DIAGNOSIS — F331 Major depressive disorder, recurrent, moderate: Secondary | ICD-10-CM

## 2020-12-27 DIAGNOSIS — F411 Generalized anxiety disorder: Secondary | ICD-10-CM | POA: Diagnosis not present

## 2020-12-27 DIAGNOSIS — R41840 Attention and concentration deficit: Secondary | ICD-10-CM

## 2020-12-27 NOTE — Progress Notes (Signed)
   Neuropsychology Feedback Session [name]. [facility]  Reason for Referral:   Elizabeth Phelps is a 65 y.o. right-handed Caucasian female referred by Ellouise Newer, M.D., to characterize her current cognitive functioning and assist with diagnostic clarity and treatment planning in the context of subjective cognitive decline, migraine headaches, significant anxiety, and history of a non-ruptured right MCA aneurysm.  Feedback:   Elizabeth Phelps completed a comprehensive neuropsychological evaluation on 12/19/2020. Please refer to that encounter for the full report and recommendations. Briefly, results suggested isolated deficits across verbal fluency (both phonemic and semantic), as well as fine motor coordination/speed when using her dominant (right) hand. There was also a weakness learning visually presented information. However, other aspects of learning and memory were appropriate. Relative to her prior evaluation in March 2021, a large degree of stability was exhibited. Performances as a whole were less variable as previously observed. Improvements were exhibited across several tasks, including those assessing verbal learning and memory, basic attention, and visuospatial abilities. No cognitive domains exhibited a significant decline across the two evaluations. At present, the most likely cause for subjective cognitive dysfunction continues to surround underlying traits of ADHD, exacerbated by significant psychiatric distress and disrupted sleep. I agree with Elizabeth Phelps's own statement that "anxiety is [her] key" as this appears to be the strongest driver of psychiatric symptoms and subjective dysfunction at the present time.  Elizabeth Phelps was unaccompanied during the current feedback session. Content of the current session focused on the results of her neuropsychological evaluation. Elizabeth Phelps was given the opportunity to ask questions and her questions  were answered. She was encouraged to reach out should additional questions arise. A copy of her report was provided at the conclusion of the visit.      25 minutes were spent conducting the current feedback session with Elizabeth Phelps, billed as one unit [phone].

## 2020-12-29 ENCOUNTER — Telehealth (HOSPITAL_COMMUNITY): Payer: Self-pay | Admitting: Radiology

## 2020-12-29 ENCOUNTER — Other Ambulatory Visit (HOSPITAL_COMMUNITY): Payer: Self-pay | Admitting: Interventional Radiology

## 2020-12-29 DIAGNOSIS — I671 Cerebral aneurysm, nonruptured: Secondary | ICD-10-CM

## 2020-12-29 NOTE — Telephone Encounter (Signed)
Called pt back, she is ready to schedule her cerebral angiogram with Dr. Estanislado Pandy. Waiting on insurance auth. JM

## 2021-01-03 ENCOUNTER — Ambulatory Visit
Admission: RE | Admit: 2021-01-03 | Discharge: 2021-01-03 | Disposition: A | Discharge status: Home / Self Care | Payer: Medicaid Other | Source: Ambulatory Visit | Attending: Nurse Practitioner | Admitting: Nurse Practitioner

## 2021-01-03 ENCOUNTER — Other Ambulatory Visit: Payer: Self-pay | Admitting: Student

## 2021-01-03 DIAGNOSIS — K754 Autoimmune hepatitis: Secondary | ICD-10-CM

## 2021-01-03 DIAGNOSIS — K743 Primary biliary cirrhosis: Secondary | ICD-10-CM

## 2021-01-04 ENCOUNTER — Encounter (HOSPITAL_COMMUNITY): Payer: Self-pay

## 2021-01-04 ENCOUNTER — Other Ambulatory Visit: Payer: Self-pay

## 2021-01-04 ENCOUNTER — Ambulatory Visit (HOSPITAL_COMMUNITY)
Admission: RE | Admit: 2021-01-04 | Discharge: 2021-01-04 | Disposition: A | Discharge status: Home / Self Care | Payer: Medicare Other | Source: Ambulatory Visit | Attending: Interventional Radiology | Admitting: Interventional Radiology

## 2021-01-04 ENCOUNTER — Other Ambulatory Visit (HOSPITAL_COMMUNITY): Payer: Self-pay | Admitting: Interventional Radiology

## 2021-01-04 DIAGNOSIS — I671 Cerebral aneurysm, nonruptured: Secondary | ICD-10-CM | POA: Diagnosis not present

## 2021-01-04 DIAGNOSIS — Z885 Allergy status to narcotic agent status: Secondary | ICD-10-CM | POA: Diagnosis not present

## 2021-01-04 DIAGNOSIS — Q0700 Arnold-Chiari syndrome without spina bifida or hydrocephalus: Secondary | ICD-10-CM | POA: Insufficient documentation

## 2021-01-04 DIAGNOSIS — F17210 Nicotine dependence, cigarettes, uncomplicated: Secondary | ICD-10-CM | POA: Diagnosis not present

## 2021-01-04 DIAGNOSIS — E785 Hyperlipidemia, unspecified: Secondary | ICD-10-CM | POA: Diagnosis not present

## 2021-01-04 DIAGNOSIS — R519 Headache, unspecified: Secondary | ICD-10-CM | POA: Diagnosis not present

## 2021-01-04 DIAGNOSIS — Z79899 Other long term (current) drug therapy: Secondary | ICD-10-CM | POA: Diagnosis not present

## 2021-01-04 DIAGNOSIS — I4519 Other right bundle-branch block: Secondary | ICD-10-CM | POA: Insufficient documentation

## 2021-01-04 DIAGNOSIS — F411 Generalized anxiety disorder: Secondary | ICD-10-CM | POA: Insufficient documentation

## 2021-01-04 DIAGNOSIS — F331 Major depressive disorder, recurrent, moderate: Secondary | ICD-10-CM

## 2021-01-04 DIAGNOSIS — R413 Other amnesia: Secondary | ICD-10-CM | POA: Diagnosis not present

## 2021-01-04 DIAGNOSIS — K754 Autoimmune hepatitis: Secondary | ICD-10-CM | POA: Insufficient documentation

## 2021-01-04 HISTORY — PX: IR ANGIO INTRA EXTRACRAN SEL COM CAROTID INNOMINATE BILAT MOD SED: IMG5360

## 2021-01-04 HISTORY — PX: IR ANGIO VERTEBRAL SEL VERTEBRAL UNI R MOD SED: IMG5368

## 2021-01-04 HISTORY — PX: IR ANGIO VERTEBRAL SEL SUBCLAVIAN INNOMINATE UNI L MOD SED: IMG5364

## 2021-01-04 HISTORY — PX: IR US GUIDE VASC ACCESS RIGHT: IMG2390

## 2021-01-04 LAB — CBC WITH DIFFERENTIAL/PLATELET
Abs Immature Granulocytes: 0.02 10*3/uL (ref 0.00–0.07)
Basophils Absolute: 0 10*3/uL (ref 0.0–0.1)
Basophils Relative: 1 %
Eosinophils Absolute: 0.1 10*3/uL (ref 0.0–0.5)
Eosinophils Relative: 2 %
HCT: 43.8 % (ref 36.0–46.0)
Hemoglobin: 14 g/dL (ref 12.0–15.0)
Immature Granulocytes: 0 %
Lymphocytes Relative: 24 %
Lymphs Abs: 1.2 10*3/uL (ref 0.7–4.0)
MCH: 29.4 pg (ref 26.0–34.0)
MCHC: 32 g/dL (ref 30.0–36.0)
MCV: 91.8 fL (ref 80.0–100.0)
Monocytes Absolute: 0.7 10*3/uL (ref 0.1–1.0)
Monocytes Relative: 14 %
Neutro Abs: 2.9 10*3/uL (ref 1.7–7.7)
Neutrophils Relative %: 59 %
Platelets: 180 10*3/uL (ref 150–400)
RBC: 4.77 MIL/uL (ref 3.87–5.11)
RDW: 14.2 % (ref 11.5–15.5)
WBC: 5 10*3/uL (ref 4.0–10.5)
nRBC: 0 % (ref 0.0–0.2)

## 2021-01-04 LAB — BASIC METABOLIC PANEL
Anion gap: 6 (ref 5–15)
BUN: 11 mg/dL (ref 8–23)
CO2: 27 mmol/L (ref 22–32)
Calcium: 9.1 mg/dL (ref 8.9–10.3)
Chloride: 107 mmol/L (ref 98–111)
Creatinine, Ser: 0.7 mg/dL (ref 0.44–1.00)
GFR, Estimated: >60 mL/min (ref >60)
Glucose, Bld: 94 mg/dL (ref 70–99)
Potassium: 4.4 mmol/L (ref 3.5–5.1)
Sodium: 140 mmol/L (ref 135–145)

## 2021-01-04 LAB — PROTIME-INR
INR: 1 (ref 0.8–1.2)
Prothrombin Time: 13.2 seconds (ref 11.4–15.2)

## 2021-01-04 MED ORDER — NITROGLYCERIN 1 MG/10 ML FOR IR/CATH LAB
INTRA_ARTERIAL | Status: AC | PRN
  Administered 2021-01-04: 200 ug via INTRA_ARTERIAL

## 2021-01-04 MED ORDER — IOHEXOL 300 MG/ML  SOLN
100.0000 mL | Freq: Once | INTRAMUSCULAR | Status: AC | PRN
  Administered 2021-01-04: 75 mL via INTRA_ARTERIAL

## 2021-01-04 MED ORDER — HEPARIN SODIUM (PORCINE) 1000 UNIT/ML IJ SOLN
INTRAMUSCULAR | Status: AC
  Filled 2021-01-04: qty 1

## 2021-01-04 MED ORDER — FENTANYL CITRATE (PF) 100 MCG/2ML IJ SOLN
INTRAMUSCULAR | Status: AC
  Filled 2021-01-04: qty 2

## 2021-01-04 MED ORDER — NITROGLYCERIN 1 MG/10 ML FOR IR/CATH LAB
INTRA_ARTERIAL | Status: AC
  Filled 2021-01-04: qty 10

## 2021-01-04 MED ORDER — LIDOCAINE HCL 1 % IJ SOLN
INTRAMUSCULAR | Status: AC | PRN
  Administered 2021-01-04: 10 mL via INTRADERMAL

## 2021-01-04 MED ORDER — IOHEXOL 240 MG/ML SOLN
50.0000 mL | Freq: Once | INTRAMUSCULAR | Status: AC | PRN
  Administered 2021-01-04: 20 mL via INTRAVENOUS

## 2021-01-04 MED ORDER — SODIUM CHLORIDE 0.9 % IV SOLN: INTRAVENOUS | Status: AC

## 2021-01-04 MED ORDER — VERAPAMIL HCL 2.5 MG/ML IV SOLN
INTRAVENOUS | Status: AC | PRN
  Administered 2021-01-04: 2.5 mg via INTRA_ARTERIAL

## 2021-01-04 MED ORDER — SODIUM CHLORIDE 0.9 % IV BOLUS: 250.0000 mL | Freq: Once | INTRAVENOUS | Status: DC

## 2021-01-04 MED ORDER — SODIUM CHLORIDE 0.9 % IV SOLN: INTRAVENOUS | Status: DC

## 2021-01-04 MED ORDER — VERAPAMIL HCL 2.5 MG/ML IV SOLN
INTRAVENOUS | Status: AC
  Filled 2021-01-04: qty 2

## 2021-01-04 MED ORDER — LIDOCAINE HCL 1 % IJ SOLN
INTRAMUSCULAR | Status: AC
  Filled 2021-01-04: qty 20

## 2021-01-04 MED ORDER — MIDAZOLAM HCL 2 MG/2ML IJ SOLN
INTRAMUSCULAR | Status: AC | PRN
  Administered 2021-01-04: 1 mg via INTRAVENOUS

## 2021-01-04 MED ORDER — MIDAZOLAM HCL 2 MG/2ML IJ SOLN
INTRAMUSCULAR | Status: AC
  Filled 2021-01-04: qty 2

## 2021-01-04 MED ORDER — IOHEXOL 300 MG/ML  SOLN
100.0000 mL | Freq: Once | INTRAMUSCULAR | Status: AC | PRN
  Administered 2021-01-04: 25 mL

## 2021-01-04 MED ORDER — HEPARIN SODIUM (PORCINE) 1000 UNIT/ML IJ SOLN
INTRAMUSCULAR | Status: AC | PRN
  Administered 2021-01-04: 2000 [IU] via INTRAVENOUS

## 2021-01-04 MED ORDER — FENTANYL CITRATE (PF) 100 MCG/2ML IJ SOLN
INTRAMUSCULAR | Status: AC | PRN
  Administered 2021-01-04: 25 ug via INTRAVENOUS

## 2021-01-04 MED ORDER — SODIUM CHLORIDE 0.9 % IV BOLUS
INTRAVENOUS | Status: AC | PRN
  Administered 2021-01-04: 250 mL via INTRAVENOUS

## 2021-01-04 NOTE — Progress Notes (Signed)
Pt c/o headache and auras/spots in vision, asking for migraine medicine, PA paged.

## 2021-01-04 NOTE — Sedation Documentation (Signed)
Pt transported to Short Stay room 9 accompanied by RN. Page RN at bedside to receive pt. Handoff completed. Right wrist remains level 0. Pt awake alert and oriented. No s/s of distress at this time.

## 2021-01-04 NOTE — H&P (Addendum)
Chief Complaint: Patient was seen in consultation today for diagnostic cerebral arteriogram at the request of Dr. Delice Lesch, K.  Referring Physician(s): Dr. Delice Lesch, K.  Supervising Physician: Luanne Bras  Patient Status: [facility] - Out-pt  History of Present Illness: Elizabeth Phelps is a 65 y.o. female with past medical history of HLD, arteriosclerosis of aorta, autoimmune hepatitis, PBC, GAD, MDD, incomplete right bundle branch block, Arnold-Chiari malformation type I, and right MCA aneurysm s/p unsuccessful craniotomy in 2002.  Patient was first seen by neurology on 09/07/2020 due to memory loss, underwent MRI C-spine and brain with and without and MRA on 05/30/2019 which showed no acute intracranial abnormality with stable combination of fusiform and saccular right MCA aneurysm originating from the right MI segment.  Patient had a follow-up visit with neurology on 12/12/2020, and she was referred to Dr. Estanislado Pandy for new onset of right-sided headache.  Patient had a consultation visit with Dr. Estanislado Pandy on 12/21/2020.  A diagnostic cerebral arteriogram was recommended to the patient for further evaluation and management of her known right MCA aneurysm.  After thorough discussion and shared decision making, patient decided to proceed with a diagnostic cerebral arteriogram. Patient presents to [place] today for the procedure.  Upon evaluation, patient is laying in bed not in acute distress.  Patient reports that she has been dealing with right-sided migraine with aura for a while, which is under control with current medication Zomig followed by cup of caffeinated beverage.  She states that her migraine caused her to have blurry vision. She started developing dizziness and lightheadedness which caused her to fall about a month ago.  She states that her mother suffered from multiple strokes, and she is familiar with the stroke symptoms, and she denies her having any stroke symptoms such as facial  drooping, weakness in extremities, and slurred speech. She reports chronic abdominal pain due to her PBC, no new abdominal pain. Patient laying in bed, not in acute distress.  Denise  fever, chills, shortness of breath, cough, chest pain, nausea ,vomiting, and bleeding.      Past Medical History:  Diagnosis Date   Arnold-Chiari malformation, type I    Atherosclerosis of aorta 10/02/2017   Attention and concentration deficit 09/02/2019   traits of ADHD at very least   Autoimmune hepatitis 08/04/2018   Patient with PBC and autoimmune hepatitis overlap on previous biopsy.   Patient has a negative AMA, ANA was positive in a dual nuclear and speckled pattern with a titer of 1:1280, ASMA was weakly positive at 27, and a mildly elevated IgG. Biopsy reported mild to moderate interface hepatitis.  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   Diverticulosis 07/21/2020   Dysthymia 04/03/2012   Generalized anxiety disorder    History of colonic polyps 08/12/2016   Hyperlipidemia 08/12/2016   LDL goal <100   Incomplete right bundle branch block 09/22/2017   Major depressive disorder    Migraine headache    Primary biliary cholangitis 08/04/2018   Primary biliary cirrhosis    Smoker     Past Surgical History:  Procedure Laterality Date   APPENDECTOMY     BRAIN SURGERY     BREAST EXCISIONAL BIOPSY Left    CATARACT EXTRACTION Right 2018   COLONOSCOPY  2007   Dr. Collene Mares   IR RADIOLOGIST EVAL & MGMT  12/22/2020   KNEE ARTHROSCOPY Right    TONSILLECTOMY  AND ADENOIDECTOMY     age 57    Allergies: Codeine  Medications: Prior to Admission medications   Medication Sig Start Date End Date Taking? Authorizing Provider  atorvastatin (LIPITOR) 40 MG tablet Take 1 tablet (40 mg total) by mouth daily. 08/03/20  Yes Denita Lung, MD  azaTHIOprine (IMURAN) 50 MG tablet Take 100 mg by mouth daily.  02/04/20  Yes [provider]  ibuprofen (ADVIL) 200 MG tablet Take 400 mg by mouth every 6 (six) hours as needed for headache.   Yes [provider]  Multiple Vitamin (MULTIVITAMIN WITH MINERALS) TABS Take 1 tablet by mouth daily.   Yes [provider]  OCALIVA 5 MG TABS Take 5 mg by mouth daily. 08/19/18  Yes [provider]  PARoxetine (PAXIL) 40 MG tablet Take 1 tablet by mouth once daily Patient taking differently: Take 40 mg by mouth daily. 12/13/20  Yes Eulis Canner E, NP  zolmitriptan (ZOMIG) 5 MG tablet Take 1 tablet (5 mg total) by mouth as needed for migraine. 03/24/20  Yes Denita Lung, MD  Brexpiprazole (REXULTI) 0.5 MG TABS Take 1 tablet (0.5 mg total) by mouth daily. Patient not taking: No sig reported 09/07/20   Salley Slaughter, NP     Family History  Problem Relation Age of Onset   Stroke Mother    Hypertension Mother    Prostate cancer Father        mets   Aneurysm Father    Breast cancer Maternal Grandmother     Social History   Socioeconomic History   Marital status: Divorced    Spouse name: Not on file   Number of children: 2   Years of education: 12   Highest education level: High school graduate  Occupational History   Occupation: Unemployed    Comment: Prior customer service  Tobacco Use   Smoking status: Every Day    Packs/day: 0.75    Types: Cigarettes   Smokeless tobacco: Never  Vaping Use   Vaping Use: Never used  Substance and Sexual Activity   Alcohol use: No   Drug use: Yes    Types: Marijuana    Comment: several times per week to help with anxiety   Sexual activity: Not Currently  Other Topics Concern   Not on file  Social History Narrative   Right handed   Highest level of edu- HS   Lives in one story home with two roommates   Social Determinants of Health   Financial Resource Strain: Not on file  Food Insecurity: Not on file  Transportation Needs: Not on file  Physical Activity: Not  on file  Stress: Not on file  Social Connections: Not on file     Review of Systems: A 12 point ROS discussed and pertinent positives are indicated in the HPI above.  All other systems are negative.   Vital Signs: BP 108/78 (BP Location: Right Arm)   Pulse 85   Temp 98.1 F (36.7 C) (Oral)   Resp 18   Ht 5' 3.5" (1.613 m)   Wt 132 lb (59.9 kg)   SpO2 100%   BMI 23.02 kg/m   Physical Exam  Vitals and nursing note reviewed.  Constitutional:      General: He is not in acute distress.    Appearance: Normal appearance.  HENT:     Head: Normocephalic and atraumatic.     Mouth/Throat:     Mouth: Mucous membranes are moist.  Pharynx: Oropharynx is clear.  Cardiovascular:     Rate and Rhythm: Normal rate and regular rhythm.     Pulses: Normal pulses.     Heart sounds: Normal heart sounds.  Pulmonary:     Effort: Pulmonary effort is normal.     Breath sounds: Wheezing noted on all lobes, history of smoking 25-pack-year  Abdominal:     General: Bowel sounds are normal. There is no distension.     Palpations: Abdomen is soft.  Skin:    General: Skin is warm and dry.  Neurological:     Mental Status: He is alert and oriented to person, place, and time.  Psychiatric:        Mood and Affect: Mood normal.        Behavior: Behavior normal.    MD Evaluation Airway: WNL Heart: WNL Abdomen: WNL Chest/ Lungs: WNL ASA  Classification: 3 Mallampati/Airway Score: Two  Imaging: MM DIGITAL SCREENING BILATERAL  Result Date: 12/17/2020 CLINICAL DATA:  Screening. EXAM: DIGITAL SCREENING BILATERAL MAMMOGRAM WITH CAD TECHNIQUE: Bilateral screening digital craniocaudal and mediolateral oblique mammograms were obtained. The images were evaluated with computer-aided detection. COMPARISON:  Previous exam(s). ACR Breast Density Category c: The breast tissue is heterogeneously dense, which may obscure small masses. FINDINGS: There are no findings suspicious for malignancy. IMPRESSION: No  mammographic evidence of malignancy. A result letter of this screening mammogram will be mailed directly to the patient. RECOMMENDATION: Screening mammogram in one year. (Code:SM-B-01Y) BI-RADS CATEGORY  1: Negative. Electronically Signed   By: Margarette Canada M.D.   On: 12/17/2020 12:02   IR Radiologist Eval & Mgmt  Result Date: 12/22/2020 EXAM: ESTABLISHED PATIENT OFFICE VISIT CHIEF COMPLAINT: Right-sided temporal headaches. Known history of large right middle cerebral artery aneurysm. Current Pain Level: 1-10 HISTORY OF PRESENT ILLNESS: Patient is a 65 year old right handed female who has been referred by neurology for evaluation and management of a known right middle cerebral artery aneurysm. The patient is accompanied by her son today. Patient complains of right-sided temporal headaches which are usually proceeded by an aura or visual scintillations progressing to severe visual blurring and right-sided headaches. Following these headaches which are throbbing in nature, the patient becomes significantly exhausted. The frequency of these headaches ranges from 1-3 months. Most of the time she is able to avoid the severe headache component at the onset of the aura which usually entails visual scintillations, progressing to blurred vision. The patient has other issues of anxiety, and short-term memory and cognitive difficulties for which she is undergoing psychological treatment. Diagnosis * : Date . * : Arnold-Chiari malformation, type I * : . * : Atherosclerosis of aorta * : 10/02/2017 . * : Brain aneurysm * : * : Combination of fusiform and saccular right MCA aneurysm . * : Cerebral aneurysm without rupture * : . * : Dysthymia * : 04/03/2012 . * : Generalized anxiety disorder * : . * : History of colonic polyps * : 08/12/2016 . * : Hyperlipidemia LDL goal <100 * : 08/12/2016 . * : Incomplete right bundle branch block * : 09/22/2017 . * : Major depressive disorder * : . * : Migraine headache * : . * : Primary biliary  cholangitis * : 08/04/2018 . * : Primary biliary cirrhosis * : . * : Smoker * : MEDICATIONS: Current Outpatient Medications on File Prior to Visit Medication * : Sig * : Dispense * : Refill . * : atorvastatin (LIPITOR) 40 MG tablet * : Take 1  tablet (40 mg total) by mouth daily. * : 90 tablet * : 3 . * : azaTHIOprine (IMURAN) 50 MG tablet * : Take 50 mg by mouth daily. Taking a total of 100mg  daily * : * : . * : Brexpiprazole (REXULTI) 0.5 MG TABS * : Take 1 tablet (0.5 mg total) by mouth daily. * : 30 tablet * : 2 . * : cephALEXin (KEFLEX) 500 MG capsule * : Take 500 mg by mouth 3 (three) times daily. For 7 days. (Patient not taking: Reported on 08/03/2020) * : * : . * : Multiple Vitamin (MULTIVITAMIN WITH MINERALS) TABS * : Take 1 tablet by mouth daily. * : * : . * : OCALIVA 10 MG TABS * : Take by mouth. * : * : . * : OCALIVA 5 MG TABS * : (Patient not taking: No sig reported) * : * : . * : Omega-3 Fatty Acids (FISH OIL PO) * : Take by mouth daily. (Patient not taking: No sig reported) * : * : . * : PARoxetine (PAXIL) 40 MG tablet * : Take 1 tablet (40 mg total) by mouth daily. * : 30 tablet * : 2 . * : predniSONE (DELTASONE) 10 MG tablet * : Take 5 mg by mouth daily.  (Patient not taking: No sig reported) * : * : . * : zolmitriptan (ZOMIG) 5 MG tablet * : Take 1 tablet (5 mg total) by mouth as needed for migraine. * : 10 tablet * : 11 ALLERGIES: Allergies Allergen * : Reactions . * : Codeine * : Nausea And Vomiting FAMILY HISTORY: Family History Problem * : Relation * : Age of Onset . * : Stroke * : Mother * : . * : Hypertension * : Mother * : . * : Prostate cancer * : Father * : * :     mets . * : Aneurysm * : Father * : . * : Breast cancer * : Maternal Grandmother * : SOCIAL HISTORY: Socioeconomic History . * : Marital status: * : Divorced * : * : Spouse name: * : Not on file . * : Number of children: * : 2 . * : Years of education: * : 12 . * : Highest education level: * : High school graduate Occupational  History . * : Occupation: * : Unemployed * : * : Comment: Prior customer service Tobacco Use . * : Smoking status: * : Every Day * : * : Packs/day: * : 0.75 * : * : Pack years: * : 0.00 * : * : Types: * : Cigarettes . * : Smokeless tobacco: * : Never Vaping Use . * : Vaping Use: * : Never used Substance and Sexual Activity . * : Alcohol use: * : No . * : Drug use: * : Yes * : * : Types: * : Marijuana * : * : Comment: Occasionally to help with migraine headaches . * : Sexual activity: * : Not Currently Other Topics * : Concern . * : Not on file Social History Narrative * : Right handed * : Highest level of edu- HS * : Lives in one story home with two roommates Food Insecurity: Not on file Transportation Needs: Not on file Physical Activity: Not on file Stress: Not on file Social Connections: Not on file Intimate Partner Violence: Not on file REVIEW OF SYSTEMS: Essentially negative unless as mentioned above. PHYSICAL EXAMINATION: Appears in no acute distress. Affect appropriate.  Normal visual contact. Neurologically grossly intact. ASSESSMENT AND PLAN: The patient's recent MRA examination of the brain, and the arteriogram performed in 2002 were reviewed. The MRA appears suboptimal in assessing the right middle cerebral artery distribution. However, the 2002 examination did show a wide neck saccular component extending to a more fusiform dilatation of the right middle cerebral artery M1 segment. In 2002, attempts were made for open surgical treatment without success. The patient has expressed her desire to have the aneurysm re-evaluated for endovascular treatment if possible. In order to proceed, the patient was informed that a diagnostic catheter arteriogram would be needed. This may be a radial or a femoral route. The patient is quite familiar with diagnostic cerebral arteriograms having had at least 3 or 4 of these since 2002. Patient denies any allergies to contrast media. The diagnostic arteriogram will be  schedule to be performed as soon as possible. Electronically Signed   By: Luanne Bras M.D.   On: 12/21/2020 14:01    Labs:  CBC: Recent Labs    03/24/20 1559 01/04/21 1004  WBC 10.4 5.0  HGB 14.2 14.0  HCT 41.4 43.8  PLT 184 180    COAGS: Recent Labs    01/04/21 1004  INR 1.0    BMP: Recent Labs    03/24/20 1559 01/04/21 1004  NA 142 140  K 4.0 4.4  CL 107* 107  CO2 26 27  GLUCOSE 73 94  BUN 12 11  CALCIUM 9.3 9.1  CREATININE 0.76 0.70  GFRNONAA 83 >60  GFRAA 96  --     LIVER FUNCTION TESTS: Recent Labs    03/24/20 1559  BILITOT 0.4  AST 35  ALT 22  ALKPHOS 89  PROT 7.2  ALBUMIN 4.4    TUMOR MARKERS: No results for input(s): AFPTM, CEA, CA199, CHROMGRNA in the last 8760 hours.  Assessment and Plan: 65 y.o. female with known right MCA aneurysm, s/p unsuccessful craniotomy in 2002, who was referred to Dr. Estanislado Pandy by her neurologist due to new onset of right-sided headache and to discuss the lady of endovascular treatment for the right MCA aneurysm. Patient had a consultation visit with Dr. Estanislado Pandy on 12/22/2018, diagnostic cerebral arteriogram was recommended to the patient for further evaluation and management of right MCA aneurysm.  After thorough discussion decision-making, patient decided to proceed with diagnostic cerebral arteriogram.  Patient presents to [place] today for the procedure. N.p.o. since midnight VSS CBC with differential all within normal limit BMP all within normal limit BUN 11 creatinine 0.7 GFR greater than 60 INR 1.0 Not on anticoagulation antiplatelet treatment  Updated the patient that there was a emergency case came in earlier today, and her procedure could possibly delayed till 3 PM today Patient verbalized understanding, asked to call her friend, Nevin Bloodgood, and give her an update. Ms. Nevin Bloodgood was called, and informed the situation.  Risks and benefits of cerebral angiogram with intervention were discussed with the  patient including, but not limited to bleeding, infection, vascular injury, contrast induced renal failure, stroke or even death.  This interventional procedure involves the use of X-rays and because of the nature of the planned procedure, it is possible that we will have prolonged use of X-ray fluoroscopy.  Potential radiation risks to you include (but are not limited to) the following: - A slightly elevated risk for cancer  several years later in life. This risk is typically less than 0.5% percent. This risk is low in comparison to the normal incidence of human  cancer, which is 33% for women and 50% for men according to the [employer]. - Radiation induced injury can include skin redness, resembling a rash, tissue breakdown / ulcers and hair loss (which can be temporary or permanent).   The likelihood of either of these occurring depends on the difficulty of the procedure and whether you are sensitive to radiation due to previous procedures, disease, or genetic conditions.   IF your procedure requires a prolonged use of radiation, you will be notified and given written instructions for further action.  It is your responsibility to monitor the irradiated area for the 2 weeks following the procedure and to notify your physician if you are concerned that you have suffered a radiation induced injury.    All of the patient's questions were answered, patient is agreeable to proceed.  Consent signed and in chart.     Thank you for this interesting consult.  I greatly enjoyed meeting ARRYANNA HOLQUIN and look forward to participating in their care.  A copy of this report was sent to the requesting provider on this date.  Electronically Signed: Tera Mater, PA-C 01/04/2021, 12:23 PM   I spent a total of    40 Minutes in face to face in clinical consultation, greater than 50% of which was counseling/coordinating care for diagnostic cerebral arteriogram.

## 2021-01-04 NOTE — Procedures (Signed)
S/P 4 vessel cerebral arteriogram Rtrad approach. Findings. 1.Large RT MCA M1 wide based saccular/fusiform aneurysm with x2 small saccular out pouchings . S.Zyron Deeley MD

## 2021-01-04 NOTE — Progress Notes (Signed)
Patient c/o right temple pain 4/10 with slight visual disturbance post-procedure. Patient has a history of migraines. Suspect possible contrast-related issue. Dr. Estanislado Pandy notified; 250 ml NS bolus ordered.   Please call IR with any questions.  Soyla Dryer, [place] [phone] 01/04/2021, 4:38 PM

## 2021-01-05 ENCOUNTER — Other Ambulatory Visit (HOSPITAL_COMMUNITY): Payer: Self-pay | Admitting: Interventional Radiology

## 2021-01-05 DIAGNOSIS — I671 Cerebral aneurysm, nonruptured: Secondary | ICD-10-CM

## 2021-01-08 ENCOUNTER — Telehealth: Payer: Self-pay | Admitting: Family Medicine

## 2021-01-08 ENCOUNTER — Other Ambulatory Visit (HOSPITAL_COMMUNITY): Payer: Self-pay | Admitting: Interventional Radiology

## 2021-01-08 DIAGNOSIS — F411 Generalized anxiety disorder: Secondary | ICD-10-CM

## 2021-01-08 DIAGNOSIS — I671 Cerebral aneurysm, nonruptured: Secondary | ICD-10-CM

## 2021-01-08 DIAGNOSIS — F331 Major depressive disorder, recurrent, moderate: Secondary | ICD-10-CM

## 2021-01-08 MED ORDER — PAROXETINE HCL 40 MG PO TABS: 40.0000 mg | ORAL_TABLET | Freq: Every day | ORAL | 1 refills | Status: DC

## 2021-01-08 NOTE — Telephone Encounter (Signed)
Pt called and is wanting to know if you will take over prescribing her Paxil, states she is no longer see her therapist,  If so she only has a few days left,  Please send refill to the [facility] ([facility]), [place] [facility] [place]

## 2021-01-09 ENCOUNTER — Ambulatory Visit (HOSPITAL_COMMUNITY)
Admission: RE | Admit: 2021-01-09 | Discharge: 2021-01-09 | Disposition: A | Discharge status: Home / Self Care | Payer: Medicaid Other | Source: Ambulatory Visit | Attending: Interventional Radiology | Admitting: Interventional Radiology

## 2021-01-09 ENCOUNTER — Other Ambulatory Visit: Payer: Self-pay

## 2021-01-09 DIAGNOSIS — R519 Headache, unspecified: Secondary | ICD-10-CM | POA: Diagnosis not present

## 2021-01-09 DIAGNOSIS — Z7902 Long term (current) use of antithrombotics/antiplatelets: Secondary | ICD-10-CM | POA: Diagnosis not present

## 2021-01-09 DIAGNOSIS — I671 Cerebral aneurysm, nonruptured: Secondary | ICD-10-CM | POA: Diagnosis not present

## 2021-01-09 DIAGNOSIS — K743 Primary biliary cirrhosis: Secondary | ICD-10-CM | POA: Diagnosis not present

## 2021-01-10 HISTORY — PX: IR RADIOLOGIST EVAL & MGMT: IMG5224

## 2021-01-12 ENCOUNTER — Other Ambulatory Visit: Payer: Self-pay | Admitting: Radiology

## 2021-01-12 ENCOUNTER — Other Ambulatory Visit (HOSPITAL_COMMUNITY): Payer: Self-pay | Admitting: Interventional Radiology

## 2021-01-12 ENCOUNTER — Other Ambulatory Visit (HOSPITAL_COMMUNITY): Payer: Self-pay | Admitting: Radiology

## 2021-01-12 ENCOUNTER — Encounter (HOSPITAL_COMMUNITY): Payer: Self-pay

## 2021-01-12 DIAGNOSIS — I671 Cerebral aneurysm, nonruptured: Secondary | ICD-10-CM

## 2021-01-12 MED ORDER — CLOPIDOGREL BISULFATE 75 MG PO TABS: 75.0000 mg | ORAL_TABLET | Freq: Every day | ORAL | 2 refills | Status: DC

## 2021-01-18 ENCOUNTER — Ambulatory Visit: Payer: Medicaid Other | Admitting: Gastroenterology

## 2021-01-19 ENCOUNTER — Other Ambulatory Visit (HOSPITAL_COMMUNITY): Payer: Self-pay | Admitting: Radiology

## 2021-01-19 ENCOUNTER — Other Ambulatory Visit (HOSPITAL_COMMUNITY)
Admission: RE | Admit: 2021-01-19 | Discharge: 2021-01-19 | Disposition: A | Discharge status: Home / Self Care | Payer: Medicare Other | Source: Ambulatory Visit | Attending: Interventional Radiology | Admitting: Interventional Radiology

## 2021-01-19 DIAGNOSIS — Z01812 Encounter for preprocedural laboratory examination: Secondary | ICD-10-CM | POA: Diagnosis not present

## 2021-01-19 DIAGNOSIS — I671 Cerebral aneurysm, nonruptured: Secondary | ICD-10-CM

## 2021-01-20 LAB — PLATELET INHIBITION P2Y12: Platelet Function  P2Y12: 69 [PRU] — ABNORMAL LOW (ref 182–335)

## 2021-01-22 ENCOUNTER — Encounter (HOSPITAL_COMMUNITY): Payer: Self-pay | Admitting: Interventional Radiology

## 2021-01-22 ENCOUNTER — Other Ambulatory Visit: Payer: Self-pay | Admitting: Student

## 2021-01-22 ENCOUNTER — Other Ambulatory Visit (HOSPITAL_COMMUNITY): Payer: Self-pay | Admitting: Interventional Radiology

## 2021-01-22 ENCOUNTER — Other Ambulatory Visit: Payer: Self-pay

## 2021-01-22 NOTE — Progress Notes (Signed)
Spoke with pt for pre-op call. Pt denies cardiac history, HTN or Diabetes.  Pt states she has been taking her Plavix and 81 mg Aspirin for at least 7 days as of today.   Covid test being done today. Instructed pt to wear mask when out in public or around people she does not normally live with. She voiced understanding.

## 2021-01-23 ENCOUNTER — Other Ambulatory Visit: Payer: Self-pay | Admitting: Radiology

## 2021-01-23 LAB — SARS CORONAVIRUS 2 (TAT 6-24 HRS): SARS Coronavirus 2: NEGATIVE

## 2021-01-24 ENCOUNTER — Encounter (HOSPITAL_COMMUNITY)
Admission: RE | Disposition: A | Discharge status: Home / Self Care | Payer: Self-pay | Source: Home / Self Care | Attending: Interventional Radiology

## 2021-01-24 ENCOUNTER — Ambulatory Visit (HOSPITAL_COMMUNITY)
Admission: RE | Admit: 2021-01-24 | Discharge: 2021-01-24 | Disposition: A | Discharge status: Home / Self Care | Payer: Medicare Other | Source: Ambulatory Visit | Attending: Interventional Radiology | Admitting: Interventional Radiology

## 2021-01-24 ENCOUNTER — Encounter (HOSPITAL_COMMUNITY): Payer: Self-pay | Admitting: Interventional Radiology

## 2021-01-24 ENCOUNTER — Other Ambulatory Visit: Payer: Self-pay

## 2021-01-24 ENCOUNTER — Ambulatory Visit (HOSPITAL_COMMUNITY): Payer: Medicare Other

## 2021-01-24 ENCOUNTER — Inpatient Hospital Stay (HOSPITAL_COMMUNITY)
Admission: RE | Admit: 2021-01-24 | Discharge: 2021-01-25 | DRG: 027 | Disposition: A | Discharge status: Home / Self Care | Payer: Medicare Other | Attending: Interventional Radiology | Admitting: Interventional Radiology

## 2021-01-24 DIAGNOSIS — K743 Primary biliary cirrhosis: Secondary | ICD-10-CM | POA: Diagnosis not present

## 2021-01-24 DIAGNOSIS — Z560 Unemployment, unspecified: Secondary | ICD-10-CM | POA: Diagnosis not present

## 2021-01-24 DIAGNOSIS — E785 Hyperlipidemia, unspecified: Secondary | ICD-10-CM | POA: Diagnosis not present

## 2021-01-24 DIAGNOSIS — I700 Atherosclerosis of aorta: Secondary | ICD-10-CM | POA: Diagnosis present

## 2021-01-24 DIAGNOSIS — Z79899 Other long term (current) drug therapy: Secondary | ICD-10-CM

## 2021-01-24 DIAGNOSIS — I671 Cerebral aneurysm, nonruptured: Secondary | ICD-10-CM | POA: Diagnosis not present

## 2021-01-24 DIAGNOSIS — Z9889 Other specified postprocedural states: Secondary | ICD-10-CM | POA: Diagnosis not present

## 2021-01-24 DIAGNOSIS — I4510 Unspecified right bundle-branch block: Secondary | ICD-10-CM | POA: Diagnosis present

## 2021-01-24 DIAGNOSIS — Z823 Family history of stroke: Secondary | ICD-10-CM | POA: Diagnosis not present

## 2021-01-24 DIAGNOSIS — F17210 Nicotine dependence, cigarettes, uncomplicated: Secondary | ICD-10-CM | POA: Diagnosis not present

## 2021-01-24 DIAGNOSIS — Q0700 Arnold-Chiari syndrome without spina bifida or hydrocephalus: Secondary | ICD-10-CM

## 2021-01-24 DIAGNOSIS — Z20822 Contact with and (suspected) exposure to covid-19: Secondary | ICD-10-CM | POA: Diagnosis present

## 2021-01-24 DIAGNOSIS — Z8249 Family history of ischemic heart disease and other diseases of the circulatory system: Secondary | ICD-10-CM

## 2021-01-24 DIAGNOSIS — K754 Autoimmune hepatitis: Secondary | ICD-10-CM | POA: Diagnosis present

## 2021-01-24 DIAGNOSIS — D649 Anemia, unspecified: Secondary | ICD-10-CM | POA: Diagnosis present

## 2021-01-24 DIAGNOSIS — G43909 Migraine, unspecified, not intractable, without status migrainosus: Secondary | ICD-10-CM | POA: Diagnosis present

## 2021-01-24 DIAGNOSIS — F411 Generalized anxiety disorder: Secondary | ICD-10-CM | POA: Diagnosis present

## 2021-01-24 DIAGNOSIS — Z8042 Family history of malignant neoplasm of prostate: Secondary | ICD-10-CM | POA: Diagnosis not present

## 2021-01-24 DIAGNOSIS — Z803 Family history of malignant neoplasm of breast: Secondary | ICD-10-CM

## 2021-01-24 DIAGNOSIS — G935 Compression of brain: Secondary | ICD-10-CM | POA: Diagnosis not present

## 2021-01-24 HISTORY — DX: Other complications of anesthesia, initial encounter: T88.59XA

## 2021-01-24 HISTORY — PX: IR ANGIO INTRA EXTRACRAN SEL INTERNAL CAROTID UNI R MOD SED: IMG5362

## 2021-01-24 HISTORY — PX: IR CT HEAD LTD: IMG2386

## 2021-01-24 HISTORY — PX: RADIOLOGY WITH ANESTHESIA: SHX6223

## 2021-01-24 HISTORY — DX: Anemia, unspecified: D64.9

## 2021-01-24 HISTORY — PX: IR US GUIDE VASC ACCESS RIGHT: IMG2390

## 2021-01-24 HISTORY — PX: IR TRANSCATH/EMBOLIZ: IMG695

## 2021-01-24 LAB — CBC WITH DIFFERENTIAL/PLATELET
Abs Immature Granulocytes: 0.03 10*3/uL (ref 0.00–0.07)
Basophils Absolute: 0.1 10*3/uL (ref 0.0–0.1)
Basophils Relative: 1 %
Eosinophils Absolute: 0.1 10*3/uL (ref 0.0–0.5)
Eosinophils Relative: 2 %
HCT: 39.7 % (ref 36.0–46.0)
Hemoglobin: 13 g/dL (ref 12.0–15.0)
Immature Granulocytes: 1 %
Lymphocytes Relative: 17 %
Lymphs Abs: 1 10*3/uL (ref 0.7–4.0)
MCH: 29.5 pg (ref 26.0–34.0)
MCHC: 32.7 g/dL (ref 30.0–36.0)
MCV: 90.2 fL (ref 80.0–100.0)
Monocytes Absolute: 0.7 10*3/uL (ref 0.1–1.0)
Monocytes Relative: 12 %
Neutro Abs: 3.9 10*3/uL (ref 1.7–7.7)
Neutrophils Relative %: 67 %
Platelets: 145 10*3/uL — ABNORMAL LOW (ref 150–400)
RBC: 4.4 MIL/uL (ref 3.87–5.11)
RDW: 13.7 % (ref 11.5–15.5)
WBC: 5.8 10*3/uL (ref 4.0–10.5)
nRBC: 0 % (ref 0.0–0.2)

## 2021-01-24 LAB — HEPARIN LEVEL (UNFRACTIONATED)
Heparin Unfractionated: 0.76 [IU]/mL — ABNORMAL HIGH (ref 0.30–0.70)
Heparin Unfractionated: 0.16 IU/mL — ABNORMAL LOW (ref 0.30–0.70)

## 2021-01-24 LAB — BASIC METABOLIC PANEL
Anion gap: 9 (ref 5–15)
BUN: 13 mg/dL (ref 8–23)
CO2: 22 mmol/L (ref 22–32)
Calcium: 8.8 mg/dL — ABNORMAL LOW (ref 8.9–10.3)
Chloride: 108 mmol/L (ref 98–111)
Creatinine, Ser: 0.69 mg/dL (ref 0.44–1.00)
GFR, Estimated: >60 mL/min (ref >60)
Glucose, Bld: 97 mg/dL (ref 70–99)
Potassium: 4 mmol/L (ref 3.5–5.1)
Sodium: 139 mmol/L (ref 135–145)

## 2021-01-24 LAB — POCT ACTIVATED CLOTTING TIME
Activated Clotting Time: 237 seconds
Activated Clotting Time: 260 s

## 2021-01-24 LAB — PROTIME-INR
INR: 1.1 (ref 0.8–1.2)
Prothrombin Time: 13.7 seconds (ref 11.4–15.2)

## 2021-01-24 LAB — MRSA NEXT GEN BY PCR, NASAL: MRSA by PCR Next Gen: NOT DETECTED

## 2021-01-24 SURGERY — RADIOLOGY WITH ANESTHESIA
Anesthesia: General

## 2021-01-24 MED ORDER — ACETAMINOPHEN 160 MG/5ML PO SOLN: 650.0000 mg | ORAL | Status: DC | PRN

## 2021-01-24 MED ORDER — EPTIFIBATIDE 20 MG/10ML IV SOLN
INTRAVENOUS | Status: AC
  Filled 2021-01-24: qty 10

## 2021-01-24 MED ORDER — LABETALOL HCL 5 MG/ML IV SOLN
INTRAVENOUS | Status: DC | PRN
  Administered 2021-01-24: 5 mg via INTRAVENOUS
  Administered 2021-01-24: 10 mg via INTRAVENOUS

## 2021-01-24 MED ORDER — ROCURONIUM BROMIDE 10 MG/ML (PF) SYRINGE
PREFILLED_SYRINGE | INTRAVENOUS | Status: DC | PRN
Start: 2021-01-24 — End: 2021-01-24
  Administered 2021-01-24: 10 mg via INTRAVENOUS
  Administered 2021-01-24: 60 mg via INTRAVENOUS
  Administered 2021-01-24 (×2): 10 mg via INTRAVENOUS

## 2021-01-24 MED ORDER — NITROGLYCERIN 1 MG/10 ML FOR IR/CATH LAB
INTRA_ARTERIAL | Status: AC
  Filled 2021-01-24: qty 10

## 2021-01-24 MED ORDER — CEFAZOLIN SODIUM-DEXTROSE 2-4 GM/100ML-% IV SOLN
2.0000 g | INTRAVENOUS | Status: AC
  Administered 2021-01-24 (×2): 2 g via INTRAVENOUS
  Filled 2021-01-24: qty 100

## 2021-01-24 MED ORDER — CLOPIDOGREL BISULFATE 75 MG PO TABS
75.0000 mg | ORAL_TABLET | Freq: Every day | ORAL | Status: DC
  Administered 2021-01-25: 75 mg via ORAL
  Filled 2021-01-24: qty 1

## 2021-01-24 MED ORDER — CLOPIDOGREL BISULFATE 75 MG PO TABS: 75.0000 mg | ORAL_TABLET | Freq: Every day | ORAL | Status: DC

## 2021-01-24 MED ORDER — IOHEXOL 300 MG/ML  SOLN
50.0000 mL | Freq: Once | INTRAMUSCULAR | Status: AC | PRN
  Administered 2021-01-24: 40 mL via INTRA_ARTERIAL

## 2021-01-24 MED ORDER — CHLORHEXIDINE GLUCONATE CLOTH 2 % EX PADS
6.0000 | MEDICATED_PAD | Freq: Every day | CUTANEOUS | Status: DC
  Administered 2021-01-24: 6 via TOPICAL

## 2021-01-24 MED ORDER — VERAPAMIL HCL 2.5 MG/ML IV SOLN
INTRAVENOUS | Status: AC | PRN
  Administered 2021-01-24: 2.5 mg via INTRA_ARTERIAL

## 2021-01-24 MED ORDER — HEPARIN SODIUM (PORCINE) 1000 UNIT/ML IJ SOLN
INTRAMUSCULAR | Status: AC | PRN
  Administered 2021-01-24: 2000 [IU] via INTRAVENOUS

## 2021-01-24 MED ORDER — FENTANYL CITRATE (PF) 100 MCG/2ML IJ SOLN
INTRAMUSCULAR | Status: AC
  Filled 2021-01-24: qty 2

## 2021-01-24 MED ORDER — PROMETHAZINE HCL 25 MG/ML IJ SOLN: 6.2500 mg | INTRAMUSCULAR | Status: DC | PRN

## 2021-01-24 MED ORDER — ACETAMINOPHEN 325 MG PO TABS: 650.0000 mg | ORAL_TABLET | ORAL | Status: DC | PRN

## 2021-01-24 MED ORDER — HYDROMORPHONE HCL 1 MG/ML IJ SOLN: 0.2500 mg | INTRAMUSCULAR | Status: DC | PRN

## 2021-01-24 MED ORDER — ASPIRIN 81 MG PO CHEW: 81.0000 mg | CHEWABLE_TABLET | Freq: Every day | ORAL | Status: DC

## 2021-01-24 MED ORDER — OXYCODONE HCL 5 MG PO TABS: 5.0000 mg | ORAL_TABLET | Freq: Once | ORAL | Status: DC | PRN

## 2021-01-24 MED ORDER — HEPARIN (PORCINE) 25000 UT/250ML-% IV SOLN
INTRAVENOUS | Status: AC
  Administered 2021-01-24: 500 [IU]/h via INTRAVENOUS
  Filled 2021-01-24: qty 250

## 2021-01-24 MED ORDER — MIDAZOLAM HCL 2 MG/2ML IJ SOLN
INTRAMUSCULAR | Status: AC
  Filled 2021-01-24: qty 2

## 2021-01-24 MED ORDER — SODIUM CHLORIDE (PF) 0.9 % IJ SOLN
INTRAVENOUS | Status: AC | PRN
  Administered 2021-01-24 (×2): 200 ug via INTRA_ARTERIAL

## 2021-01-24 MED ORDER — ESMOLOL HCL 100 MG/10ML IV SOLN
INTRAVENOUS | Status: DC | PRN
  Administered 2021-01-24: 20 mg via INTRAVENOUS
  Administered 2021-01-24: 50 mg via INTRAVENOUS
  Administered 2021-01-24: 30 mg via INTRAVENOUS

## 2021-01-24 MED ORDER — SUGAMMADEX SODIUM 200 MG/2ML IV SOLN
INTRAVENOUS | Status: DC | PRN
  Administered 2021-01-24: 200 mg via INTRAVENOUS

## 2021-01-24 MED ORDER — SODIUM CHLORIDE 0.9 % IV SOLN: INTRAVENOUS | Status: DC

## 2021-01-24 MED ORDER — PHENYLEPHRINE 40 MCG/ML (10ML) SYRINGE FOR IV PUSH (FOR BLOOD PRESSURE SUPPORT)
PREFILLED_SYRINGE | INTRAVENOUS | Status: DC | PRN
  Administered 2021-01-24: 120 ug via INTRAVENOUS
  Administered 2021-01-24 (×2): 80 ug via INTRAVENOUS
  Administered 2021-01-24: 120 ug via INTRAVENOUS
  Administered 2021-01-24: 80 ug via INTRAVENOUS

## 2021-01-24 MED ORDER — ASPIRIN EC 81 MG PO TBEC
81.0000 mg | DELAYED_RELEASE_TABLET | Freq: Every day | ORAL | Status: DC
  Administered 2021-01-24: 81 mg via ORAL
  Filled 2021-01-24 (×2): qty 1

## 2021-01-24 MED ORDER — ASPIRIN EC 325 MG PO TBEC
325.0000 mg | DELAYED_RELEASE_TABLET | ORAL | Status: DC
  Filled 2021-01-24: qty 1

## 2021-01-24 MED ORDER — ASPIRIN 81 MG PO CHEW
81.0000 mg | CHEWABLE_TABLET | Freq: Every day | ORAL | Status: DC
  Administered 2021-01-25: 81 mg via ORAL
  Filled 2021-01-24: qty 1

## 2021-01-24 MED ORDER — HEPARIN (PORCINE) 25000 UT/250ML-% IV SOLN
500.0000 [IU]/h | INTRAVENOUS | Status: DC
  Administered 2021-01-24: 500 [IU]/h via INTRAVENOUS
  Filled 2021-01-24: qty 250

## 2021-01-24 MED ORDER — FENTANYL CITRATE (PF) 100 MCG/2ML IJ SOLN
INTRAMUSCULAR | Status: DC | PRN
  Administered 2021-01-24: 100 ug via INTRAVENOUS

## 2021-01-24 MED ORDER — NIMODIPINE 30 MG PO CAPS: 0.0000 mg | ORAL_CAPSULE | ORAL | Status: DC

## 2021-01-24 MED ORDER — ACETAMINOPHEN 650 MG RE SUPP: 650.0000 mg | RECTAL | Status: DC | PRN

## 2021-01-24 MED ORDER — PAROXETINE HCL 20 MG PO TABS
40.0000 mg | ORAL_TABLET | Freq: Every day | ORAL | Status: DC
  Administered 2021-01-24: 40 mg via ORAL
  Filled 2021-01-24: qty 2

## 2021-01-24 MED ORDER — LIDOCAINE 2% (20 MG/ML) 5 ML SYRINGE
INTRAMUSCULAR | Status: DC | PRN
  Administered 2021-01-24: 60 mg via INTRAVENOUS

## 2021-01-24 MED ORDER — ONDANSETRON HCL 4 MG/2ML IJ SOLN
INTRAMUSCULAR | Status: DC | PRN
  Administered 2021-01-24: 4 mg via INTRAVENOUS

## 2021-01-24 MED ORDER — VERAPAMIL HCL 2.5 MG/ML IV SOLN
INTRAVENOUS | Status: AC
  Filled 2021-01-24: qty 2

## 2021-01-24 MED ORDER — PHENYLEPHRINE HCL-NACL 20-0.9 MG/250ML-% IV SOLN
INTRAVENOUS | Status: DC | PRN
  Administered 2021-01-24: 20 ug/min via INTRAVENOUS

## 2021-01-24 MED ORDER — DEXAMETHASONE SODIUM PHOSPHATE 10 MG/ML IJ SOLN
INTRAMUSCULAR | Status: DC | PRN
Start: 2021-01-24 — End: 2021-01-24
  Administered 2021-01-24: 5 mg via INTRAVENOUS

## 2021-01-24 MED ORDER — HEPARIN SODIUM (PORCINE) 1000 UNIT/ML IJ SOLN
INTRAMUSCULAR | Status: AC
  Filled 2021-01-24: qty 1

## 2021-01-24 MED ORDER — OXYCODONE HCL 5 MG/5ML PO SOLN
5.0000 mg | Freq: Once | ORAL | Status: DC | PRN
Start: 2021-01-24 — End: 2021-01-24

## 2021-01-24 MED ORDER — IOHEXOL 300 MG/ML  SOLN
50.0000 mL | Freq: Once | INTRAMUSCULAR | Status: AC | PRN
  Administered 2021-01-24: 15 mL via INTRA_ARTERIAL

## 2021-01-24 MED ORDER — LACTATED RINGERS IV SOLN: INTRAVENOUS | Status: DC

## 2021-01-24 MED ORDER — EPTIFIBATIDE 20 MG/10ML IV SOLN
INTRAVENOUS | Status: AC | PRN
  Administered 2021-01-24 (×3): 1.5 mg via INTRAVENOUS

## 2021-01-24 MED ORDER — ADULT MULTIVITAMIN W/MINERALS CH
1.0000 | ORAL_TABLET | Freq: Every day | ORAL | Status: DC
  Administered 2021-01-24: 1 via ORAL
  Filled 2021-01-24: qty 1

## 2021-01-24 MED ORDER — CLEVIDIPINE BUTYRATE 0.5 MG/ML IV EMUL
0.0000 mg/h | INTRAVENOUS | Status: DC
  Filled 2021-01-24: qty 50

## 2021-01-24 MED ORDER — MIDAZOLAM HCL 5 MG/5ML IJ SOLN
INTRAMUSCULAR | Status: DC | PRN
  Administered 2021-01-24: 2 mg via INTRAVENOUS

## 2021-01-24 MED ORDER — OBETICHOLIC ACID 5 MG PO TABS
5.0000 mg | ORAL_TABLET | Freq: Every day | ORAL | Status: DC
  Administered 2021-01-24: 5 mg via ORAL

## 2021-01-24 MED ORDER — LIDOCAINE HCL 1 % IJ SOLN
INTRAMUSCULAR | Status: AC
  Filled 2021-01-24: qty 20

## 2021-01-24 MED ORDER — CHLORHEXIDINE GLUCONATE 0.12 % MT SOLN
15.0000 mL | Freq: Once | OROMUCOSAL | Status: AC
  Administered 2021-01-24: 15 mL via OROMUCOSAL
  Filled 2021-01-24: qty 15

## 2021-01-24 MED ORDER — CLEVIDIPINE BUTYRATE 0.5 MG/ML IV EMUL
INTRAVENOUS | Status: AC
  Filled 2021-01-24: qty 50

## 2021-01-24 MED ORDER — LIDOCAINE HCL 1 % IJ SOLN
INTRAMUSCULAR | Status: AC | PRN
Start: 2021-01-24 — End: 2021-01-24
  Administered 2021-01-24: 5 mL

## 2021-01-24 MED ORDER — AMISULPRIDE (ANTIEMETIC) 5 MG/2ML IV SOLN: 10.0000 mg | Freq: Once | INTRAVENOUS | Status: DC | PRN

## 2021-01-24 MED ORDER — PNEUMOCOCCAL VAC POLYVALENT 25 MCG/0.5ML IJ INJ
0.5000 mL | INJECTION | INTRAMUSCULAR | Status: DC
  Filled 2021-01-24: qty 0.5

## 2021-01-24 MED ORDER — PROPOFOL 10 MG/ML IV BOLUS
INTRAVENOUS | Status: DC | PRN
  Administered 2021-01-24: 10 mg via INTRAVENOUS
  Administered 2021-01-24: 150 mg via INTRAVENOUS
  Administered 2021-01-24: 40 mg via INTRAVENOUS

## 2021-01-24 MED ORDER — CLOPIDOGREL BISULFATE 75 MG PO TABS
75.0000 mg | ORAL_TABLET | ORAL | Status: DC
  Filled 2021-01-24: qty 1

## 2021-01-24 MED ORDER — ORAL CARE MOUTH RINSE: 15.0000 mL | Freq: Once | OROMUCOSAL | Status: AC

## 2021-01-24 MED ORDER — ATORVASTATIN CALCIUM 40 MG PO TABS
40.0000 mg | ORAL_TABLET | Freq: Every day | ORAL | Status: DC
  Administered 2021-01-24: 40 mg via ORAL
  Filled 2021-01-24: qty 1

## 2021-01-24 MED ORDER — IOHEXOL 300 MG/ML  SOLN
50.0000 mL | Freq: Once | INTRAMUSCULAR | Status: AC | PRN
  Administered 2021-01-24: 30 mL via INTRA_ARTERIAL

## 2021-01-24 MED ORDER — AZATHIOPRINE 50 MG PO TABS
100.0000 mg | ORAL_TABLET | Freq: Every day | ORAL | Status: DC
  Administered 2021-01-24: 100 mg via ORAL
  Filled 2021-01-24: qty 2

## 2021-01-24 MED ORDER — HEPARIN (PORCINE) 25000 UT/250ML-% IV SOLN
500.0000 [IU]/h | INTRAVENOUS | Status: DC
  Filled 2021-01-24: qty 250

## 2021-01-24 MED ORDER — HEPARIN SODIUM (PORCINE) 1000 UNIT/ML IJ SOLN
INTRAMUSCULAR | Status: DC | PRN
  Administered 2021-01-24: 1000 [IU] via INTRAVENOUS

## 2021-01-24 MED ORDER — MEPERIDINE HCL 25 MG/ML IJ SOLN: 6.2500 mg | INTRAMUSCULAR | Status: DC | PRN

## 2021-01-24 NOTE — Transfer of Care (Signed)
Immediate Anesthesia Transfer of Care Note  Patient: Elizabeth Phelps  Procedure(s) Performed: RADIOLOGY WITH ANESTHESIA EMBOLIZATION  Patient Location: PACU  Anesthesia Type:General  Level of Consciousness: awake and drowsy  Airway & Oxygen Therapy: Patient Spontanous Breathing and Patient connected to face mask  Post-op Assessment: Report given to RN and Post -op Vital signs reviewed and stable  Post vital signs: Reviewed and stable  Last Vitals:  Vitals Value Taken Time  BP    Temp    Pulse 74 01/24/21 1146  Resp 14 01/24/21 1146  SpO2 100 % 01/24/21 1146  Vitals shown include unvalidated device data.  Last Pain:  Vitals:   01/24/21 0640  TempSrc: Oral         Complications: No notable events documented.

## 2021-01-24 NOTE — Anesthesia Procedure Notes (Signed)
Procedure Name: Intubation Date/Time: 01/24/2021 8:44 AM Performed by: Georgia Duff, CRNA Pre-anesthesia Checklist: Patient identified, Emergency Drugs available, Suction available and Patient being monitored Patient Re-evaluated:Patient Re-evaluated prior to induction Oxygen Delivery Method: Circle System Utilized Preoxygenation: Pre-oxygenation with 100% oxygen Induction Type: IV induction Ventilation: Mask ventilation without difficulty Laryngoscope Size: Mac and 4 Tube type: Oral Tube size: 7.0 mm Number of attempts: 1 Airway Equipment and Method: Stylet and Oral airway Placement Confirmation: ETT inserted through vocal cords under direct vision, positive ETCO2 and breath sounds checked- equal and bilateral Secured at: 21 cm Tube secured with: Tape Dental Injury: Teeth and Oropharynx as per pre-operative assessment

## 2021-01-24 NOTE — H&P (Signed)
Chief Complaint: Patient was seen in consultation today for diagnostic cerebral arteriogram at the request of Dr. Delice Lesch, K.  Supervising Physician: Luanne Bras  Patient Status: [facility] - Out-pt  History of Present Illness: Elizabeth Phelps is a 65 y.o. female with past medical history of HLD, arteriosclerosis of aorta, autoimmune hepatitis, PBC, GAD, MDD, incomplete right bundle branch block, Arnold-Chiari malformation type I, and right MCA aneurysm s/p unsuccessful craniotomy in 2002.  Patient was first seen by neurology on 09/07/2020 due to memory loss, underwent MRI C-spine and brain with and without and MRA on 05/30/2019 which showed no acute intracranial abnormality with stable combination of fusiform and saccular right MCA aneurysm originating from the right MI segment.  Patient had a follow-up visit with neurology on 12/12/2020, and she was referred to Dr. Estanislado Pandy for new onset of right-sided headache.  Patient had a consultation visit with Dr. Estanislado Pandy on 12/22/2018, diagnostic cerebral arteriogram was performed on 7/18.  She is scheduled for angiogram with intervention today. PMHx, meds, labs, imaging, allergies reviewed. Has taken Plavix and ASA as directed. Feels well, no recent fevers, chills, illness. Has been NPO today as directed.    Past Medical History:  Diagnosis Date   Anemia    Arnold-Chiari malformation, type I    Atherosclerosis of aorta 10/02/2017   Attention and concentration deficit 09/02/2019   traits of ADHD at very least   Autoimmune hepatitis 08/04/2018   Patient with PBC and autoimmune hepatitis overlap on previous biopsy.   Patient has a negative AMA, ANA was positive in a dual nuclear and speckled pattern with a titer of 1:1280, ASMA was weakly positive at 27, and a mildly elevated IgG. Biopsy reported mild to moderate interface hepatitis.  She is currently on azathioprine 100 mg dail   Cerebral aneurysm without rupture    Combination of  fusiform and saccular right MCA aneurysm   Complication of anesthesia    hard to wake up after brain surgery in 2002   Diverticulosis 07/21/2020   Dysthymia 04/03/2012   Generalized anxiety disorder    History of colonic polyps 08/12/2016   Hyperlipidemia 08/12/2016   LDL goal <100   Incomplete right bundle branch block 09/22/2017   Major depressive disorder    Migraine headache    Primary biliary cholangitis 08/04/2018   Primary biliary cirrhosis    Smoker     Past Surgical History:  Procedure Laterality Date   APPENDECTOMY     BRAIN SURGERY     BREAST EXCISIONAL BIOPSY Left    CATARACT EXTRACTION Right 2018   COLONOSCOPY  2007   Dr. Collene Mares   IR ANGIO INTRA EXTRACRAN SEL COM CAROTID INNOMINATE BILAT MOD SED  01/04/2021   IR ANGIO VERTEBRAL SEL SUBCLAVIAN INNOMINATE UNI L MOD SED  01/04/2021   IR ANGIO VERTEBRAL SEL VERTEBRAL UNI R MOD SED  01/04/2021   IR RADIOLOGIST EVAL & MGMT  12/22/2020   IR RADIOLOGIST EVAL & MGMT  01/10/2021   IR US GUIDE VASC ACCESS RIGHT  01/04/2021   KNEE ARTHROSCOPY Right    TONSILLECTOMY AND ADENOIDECTOMY     age 65    Allergies: Codeine  Medications: Prior to Admission medications   Medication Sig Start Date End Date Taking? Authorizing Provider  atorvastatin (LIPITOR) 40 MG tablet Take 1 tablet (40 mg total) by mouth daily. 08/03/20  Yes Denita Lung, MD  azaTHIOprine (IMURAN) 50 MG tablet Take 100 mg by mouth daily. 02/04/20  Yes [provider]  ibuprofen (  ADVIL) 200 MG tablet Take 400 mg by mouth every 6 (six) hours as needed for headache.   Yes [provider]  Multiple Vitamin (MULTIVITAMIN WITH MINERALS) TABS Take 1 tablet by mouth daily.   Yes [provider]  OCALIVA 5 MG TABS Take 5 mg by mouth daily. 08/19/18  Yes [provider]  PARoxetine (PAXIL) 40 MG tablet Take 1 tablet by mouth once daily Patient taking differently: Take 40 mg by mouth daily. 12/13/20  Yes Eulis Canner E, NP  zolmitriptan  (ZOMIG) 5 MG tablet Take 1 tablet (5 mg total) by mouth as needed for migraine. 03/24/20  Yes Denita Lung, MD  Brexpiprazole (REXULTI) 0.5 MG TABS Take 1 tablet (0.5 mg total) by mouth daily. Patient not taking: No sig reported 09/07/20   Salley Slaughter, NP     Family History  Problem Relation Age of Onset   Stroke Mother    Hypertension Mother    Prostate cancer Father        mets   Aneurysm Father    Breast cancer Maternal Grandmother     Social History   Socioeconomic History   Marital status: Divorced    Spouse name: Not on file   Number of children: 2   Years of education: 12   Highest education level: High school graduate  Occupational History   Occupation: Unemployed    Comment: Prior customer service  Tobacco Use   Smoking status: Every Day    Packs/day: 0.50    Types: Cigarettes   Smokeless tobacco: Never  Vaping Use   Vaping Use: Never used  Substance and Sexual Activity   Alcohol use: No   Drug use: Not Currently    Types: Marijuana    Comment: several times per week to help with anxiety   Sexual activity: Not Currently  Other Topics Concern   Not on file  Social History Narrative   Right handed   Highest level of edu- HS   Lives in one story home with two roommates   Social Determinants of Health   Financial Resource Strain: Not on file  Food Insecurity: Not on file  Transportation Needs: Not on file  Physical Activity: Not on file  Stress: Not on file  Social Connections: Not on file     Review of Systems: A 12 point ROS discussed and pertinent positives are indicated in the HPI above.  All other systems are negative.   Vital Signs: BP 105/69   Pulse 72   Resp 16   Ht 5' 3.5" (1.613 m)   Wt 61.2 kg   SpO2 96%   BMI 23.54 kg/m   Physical Exam  Vitals and nursing note reviewed.  Constitutional:      General: He is not in acute distress.    Appearance: Normal appearance.  HENT:     Head: Normocephalic and atraumatic.      Mouth/Throat:     Mouth: Mucous membranes are moist.     Pharynx: Oropharynx is clear.  Cardiovascular:     Rate and Rhythm: Normal rate and regular rhythm.     Pulses: Normal pulses.     Heart sounds: Normal heart sounds.  Pulmonary:     Effort: Pulmonary effort is normal.     Breath sounds: Normal Abdominal:     General: Bowel sounds are normal. There is no distension.     Palpations: Abdomen is soft.  Skin:    General: Skin is warm and dry.  Neurological:     Mental Status: He is alert and oriented to person, place, and time.  Psychiatric:        Mood and Affect: Mood normal.        Behavior: Behavior normal.       Imaging: IR US Guide Vasc Access Right  Result Date: 01/08/2021 CLINICAL DATA:  Patient with known history of a right middle cerebral artery aneurysm. Worsening headaches. Short-term memory difficulties. EXAM: IR ULTRASOUND GUIDANCE VASC ACCESS RIGHT COMPARISON:  Recent MRI MRA of the brain and previous diagnostic catheter arteriogram April 15, 2001. MEDICATIONS: Heparin 2000 units IV. The antibiotic was administered within 1 hour of the procedure. ANESTHESIA/SEDATION: Versed 1 mg IV; Fentanyl 25 mcg IV Moderate Sedation Time:  50 minutes The patient was continuously monitored during the procedure by the interventional radiology nurse under my direct supervision. CONTRAST:  Omnipaque 300 approximately 100 mL. FLUOROSCOPY TIME:  Fluoroscopy Time: 15 minutes 20 seconds (701.7 mGy). COMPLICATIONS: None immediate. TECHNIQUE: Informed written consent was obtained from the patient after a thorough discussion of the procedural risks, benefits and alternatives. All questions were addressed. Maximal Sterile Barrier Technique was utilized including caps, mask, sterile gowns, sterile gloves, sterile drape, hand hygiene and skin antiseptic. A timeout was performed prior to the initiation of the procedure. The right forearm to the wrist was prepped and draped in the usual sterile  manner. The right radial artery was then identified with ultrasound and its morphology documented. A dorsal palmar anastomosis was verified to be present. Using ultrasound guidance, and a micropuncture set access into the right radial artery was obtained over a 0.018 inch micro guidewire. A 4/5 French radial sheath was then inserted. The micro guidewire, and the obturator were removed. Good aspiration obtained from the side port of the sheath. A cocktail of 2000 units heparin, 200 mcg nitroglycerin, and 2.5 mg of verapamil was then infused in diluted form through the sheath without event. A right radial arteriogram was then performed. Over a 0.035 inch Roadrunner guidewire, a 5 [place] Simmons 2 diagnostic catheter, was advanced to the aortic arch region, and cannulations were performed of the right vertebral artery, the right common carotid artery, the left common carotid artery and the left vertebral artery. After the procedure hemostasis at the wrist puncture site was achieved with a wrist band. Distal right radial pulse was verified to be present. FINDINGS: The right vertebral artery origin is widely patent. The vessel is seen to opacify to the cranial skull base. Patency is seen of the right vertebrobasilar junction and the right posterior-inferior cerebellar artery. The basilar artery, the posterior cerebral arteries, the superior cerebellar arteries and the anterior-inferior cerebellar arteries demonstrate opacification into the capillary and venous phases. Unopacified blood is seen in the basilar artery from contralateral vertebral artery. A dominant right transverse sinus sigmoid sinus is seen as well as a right internal jugular vein. The left vertebral artery origin is widely patent. The vessel is seen to opacify to the cranial skull base. The left vertebrobasilar junction and the left posterior-inferior cerebellar artery demonstrated normal opacification. The basilar artery, the posterior cerebral  arteries, the superior cerebellar arteries and the anterior-inferior cerebellar arteries demonstrate wide patency into the capillary and venous phases. Unopacified blood is seen in the basilar artery from the contralateral vertebral artery. The left common carotid arteriogram demonstrates the left external carotid artery and its major branches to be widely patent. The left internal carotid artery at the bulb to the cranial skull base is widely  patent with mild tortuosity in its proximal 1/3. The petrous, cavernous and supraclinoid segments are widely patent. The left middle cerebral artery and the left anterior cerebral artery opacify into the capillary and venous phases. The right common carotid arteriogram demonstrates mild narrowing at the origin of the right external carotid artery. However, its branches opacified normally. The right common carotid artery at the bulb to the cranial skull base is widely patent with a U-shaped configuration involving the proximal 1/3. The petrous, cavernous and supraclinoid segments are widely patent. The right anterior cerebral artery opacifies into the capillary and venous phases. Transient opacification of the left anterior cerebral artery A2 segment and distally is seen via the anterior communicating artery. Right middle cerebral artery and its proximal segment demonstrates a large lobulated wide neck aneurysm associated with fusiform component extending just distal to the aneurysm. The more distal branches of the right middle cerebral artery opacify into the capillary and venous phases. A 3D rotational arteriogram was then performed via a right common carotid artery injection. Reformatted images did confirm the presence of a lobulated large wide neck aneurysm of the junction of the proximal and mid 1/3 of the right middle cerebral artery associated with a proximal fusiform dilatation. Also demonstrated is an approximately 4 mm outpouching from the proximal right middle  cerebral artery proximal to the large aneurysm which measures approximately 8.9 mm x 6.2 mm. IMPRESSION: Large approximately 8.9 mm x 6.2 mm wide neck lobulated saccular aneurysm arising at the junction of the proximal and the middle 1/3 of the right middle cerebral artery. Saccular aneurysm arising from the proximal third of the right middle cerebral artery measuring approximately 4 mm with a wide neck. Mild fusiform dilatation of the mid right middle cerebral artery measuring approximately 3.4 mm. PLAN: Findings reviewed with the patient. Patient to return to discuss the above angiographic findings, and management considerations as soon as possible. Electronically Signed   By: Luanne Bras M.D.   On: 01/05/2021 13:02   IR Radiologist Eval & Mgmt  Result Date: 01/10/2021 EXAM: ESTABLISHED PATIENT OFFICE VISIT CHIEF COMPLAINT: Right-sided headaches. History of large complex right middle cerebral artery lobulated aneurysm. Current Pain Level: 1-10 HISTORY OF PRESENT ILLNESS: Patient is a 65 year old right handed female with past medical history of hyperlipidemia, arteriosclerosis of the aorta, auto immune hepatitis, PBC, GAD, and DD, right bundle branch block, Arnold-Chiari type 1 malformation in addition to previous right-sided craniotomy for known right middle cerebral artery aneurysm. Clinically, the patient remains stable unchanged from the time of the history and physical of 01/04/2021. Past Medical History: As per recent H and P. Medications: See recent H and P. Allergies: See recent H and P. Social History: No change. Family History:  No change. REVIEW OF SYSTEMS: Unremarkable. PHYSICAL EXAMINATION: Neurologically fully intact. Speech and comprehension normal. Neurologically intact. In no acute distress. ASSESSMENT AND PLAN: Patient returns accompanied by a very close friend first to discuss the findings of the recent arteriogram. Patient's angiographic findings were reviewed with her right after the  arteriogram. Patient returns today to discuss management considerations of the changing complex aneurysm involving the right middle cerebral artery M1 segment. The morphology of the aneurysm on the most recent arteriogram demonstrates a large approximately 8.9 mm x 6.2 mm wide neck lobulated saccular aneurysm arising in the proximal right middle cerebral artery associated with a fusiform component. Also noted were at least 2 additional aneurysms of the right middle cerebral artery proximally, and just distal to the  aneurysm. There was question also of a daughter aneurysm arising from the fundus of the large aneurysm. Compared to the previous arteriogram of approximately 2001, there is no change in the morphology, aneurysm as described. One consideration was that of endovascular treatment using the pipeline flow diverter device. The procedure, the reasons and the alternatives were also reviewed. The endovascular option was reviewed in more detail. Given the patient's aneurysm morphology, the risk of a thromboembolic stroke of approximately 5%, with a remote chance of delayed hemorrhagic complication were reviewed. Risks of hemorrhagic complication from a ruptured aneurysm were also reviewed. This could result in immediate fatality and/or significant neurological injury and morbidity. The surgical option of bypass was also briefly discussed. Patient expressed her desire to proceed with endovascular treatment. The patient will be started on dual antiplatelets at least 10 days prior to the procedure in the form of aspirin 81 mg a day, and Plavix 75 mg a day. Platelet inhibition test to ensure adequate platelet inhibition will be planned 7 days after initiation of dual antiplatelets. The procedure will be under general anesthesia with expected overnight stay in the neuro ICU after the treatment. Questions were answered to the patient's satisfaction. The procedure will be scheduled as soon as possible. Should the patient  have any concerns or questions, the patient was advised to call. Patient was also advised to stop smoking. Electronically Signed   By: Luanne Bras M.D.   On: 01/09/2021 14:19   US Abdomen Limited RUQ (LIVER/GB)  Result Date: 01/05/2021 CLINICAL DATA:  Cirrhosis.  Primary biliary cholangitis. EXAM: ULTRASOUND ABDOMEN LIMITED RIGHT UPPER QUADRANT COMPARISON:  Abdomen/pelvis CT 07/15/2020 FINDINGS: Gallbladder: No gallstones or gallbladder wall thickening. No pericholecystic fluid. The sonographer reports no sonographic Murphy's sign. Common bile duct: Diameter: 2 mm Liver: Heterogeneous echotexture with nodular contour, features compatible with cirrhosis. No focal abnormality within the liver parenchyma. Portal vein is patent on color Doppler imaging with normal direction of blood flow towards the liver. Other: None. IMPRESSION: Heterogeneous, nodular liver contour, compatible with cirrhosis. No focal abnormality within the liver parenchyma. Electronically Signed   By: Misty Stanley M.D.   On: 01/05/2021 11:00   IR ANGIO INTRA EXTRACRAN SEL COM CAROTID INNOMINATE BILAT MOD SED  Result Date: 01/08/2021 CLINICAL DATA:  Patient with known history of a right middle cerebral artery aneurysm. Worsening headaches. Short-term memory difficulties. EXAM: IR ULTRASOUND GUIDANCE VASC ACCESS RIGHT COMPARISON:  Recent MRI MRA of the brain and previous diagnostic catheter arteriogram April 15, 2001. MEDICATIONS: Heparin 2000 units IV. The antibiotic was administered within 1 hour of the procedure. ANESTHESIA/SEDATION: Versed 1 mg IV; Fentanyl 25 mcg IV Moderate Sedation Time:  50 minutes The patient was continuously monitored during the procedure by the interventional radiology nurse under my direct supervision. CONTRAST:  Omnipaque 300 approximately 100 mL. FLUOROSCOPY TIME:  Fluoroscopy Time: 15 minutes 20 seconds (701.7 mGy). COMPLICATIONS: None immediate. TECHNIQUE: Informed written consent was obtained from the  patient after a thorough discussion of the procedural risks, benefits and alternatives. All questions were addressed. Maximal Sterile Barrier Technique was utilized including caps, mask, sterile gowns, sterile gloves, sterile drape, hand hygiene and skin antiseptic. A timeout was performed prior to the initiation of the procedure. The right forearm to the wrist was prepped and draped in the usual sterile manner. The right radial artery was then identified with ultrasound and its morphology documented. A dorsal palmar anastomosis was verified to be present. Using ultrasound guidance, and a micropuncture set access into the  right radial artery was obtained over a 0.018 inch micro guidewire. A 4/5 French radial sheath was then inserted. The micro guidewire, and the obturator were removed. Good aspiration obtained from the side port of the sheath. A cocktail of 2000 units heparin, 200 mcg nitroglycerin, and 2.5 mg of verapamil was then infused in diluted form through the sheath without event. A right radial arteriogram was then performed. Over a 0.035 inch Roadrunner guidewire, a 5 [place] Simmons 2 diagnostic catheter, was advanced to the aortic arch region, and cannulations were performed of the right vertebral artery, the right common carotid artery, the left common carotid artery and the left vertebral artery. After the procedure hemostasis at the wrist puncture site was achieved with a wrist band. Distal right radial pulse was verified to be present. FINDINGS: The right vertebral artery origin is widely patent. The vessel is seen to opacify to the cranial skull base. Patency is seen of the right vertebrobasilar junction and the right posterior-inferior cerebellar artery. The basilar artery, the posterior cerebral arteries, the superior cerebellar arteries and the anterior-inferior cerebellar arteries demonstrate opacification into the capillary and venous phases. Unopacified blood is seen in the basilar artery from  contralateral vertebral artery. A dominant right transverse sinus sigmoid sinus is seen as well as a right internal jugular vein. The left vertebral artery origin is widely patent. The vessel is seen to opacify to the cranial skull base. The left vertebrobasilar junction and the left posterior-inferior cerebellar artery demonstrated normal opacification. The basilar artery, the posterior cerebral arteries, the superior cerebellar arteries and the anterior-inferior cerebellar arteries demonstrate wide patency into the capillary and venous phases. Unopacified blood is seen in the basilar artery from the contralateral vertebral artery. The left common carotid arteriogram demonstrates the left external carotid artery and its major branches to be widely patent. The left internal carotid artery at the bulb to the cranial skull base is widely patent with mild tortuosity in its proximal 1/3. The petrous, cavernous and supraclinoid segments are widely patent. The left middle cerebral artery and the left anterior cerebral artery opacify into the capillary and venous phases. The right common carotid arteriogram demonstrates mild narrowing at the origin of the right external carotid artery. However, its branches opacified normally. The right common carotid artery at the bulb to the cranial skull base is widely patent with a U-shaped configuration involving the proximal 1/3. The petrous, cavernous and supraclinoid segments are widely patent. The right anterior cerebral artery opacifies into the capillary and venous phases. Transient opacification of the left anterior cerebral artery A2 segment and distally is seen via the anterior communicating artery. Right middle cerebral artery and its proximal segment demonstrates a large lobulated wide neck aneurysm associated with fusiform component extending just distal to the aneurysm. The more distal branches of the right middle cerebral artery opacify into the capillary and venous  phases. A 3D rotational arteriogram was then performed via a right common carotid artery injection. Reformatted images did confirm the presence of a lobulated large wide neck aneurysm of the junction of the proximal and mid 1/3 of the right middle cerebral artery associated with a proximal fusiform dilatation. Also demonstrated is an approximately 4 mm outpouching from the proximal right middle cerebral artery proximal to the large aneurysm which measures approximately 8.9 mm x 6.2 mm. IMPRESSION: Large approximately 8.9 mm x 6.2 mm wide neck lobulated saccular aneurysm arising at the junction of the proximal and the middle 1/3 of the right middle cerebral artery. Saccular  aneurysm arising from the proximal third of the right middle cerebral artery measuring approximately 4 mm with a wide neck. Mild fusiform dilatation of the mid right middle cerebral artery measuring approximately 3.4 mm. PLAN: Findings reviewed with the patient. Patient to return to discuss the above angiographic findings, and management considerations as soon as possible. Electronically Signed   By: Luanne Bras M.D.   On: 01/05/2021 13:02   IR ANGIO VERTEBRAL SEL SUBCLAVIAN INNOMINATE UNI L MOD SED  CLINICAL DATA:  Patient with known history of a right middle cerebral artery aneurysm. Worsening headaches. Short-term memory difficulties.   EXAM: IR ULTRASOUND GUIDANCE VASC ACCESS RIGHT   COMPARISON:  Recent MRI MRA of the brain and previous diagnostic catheter arteriogram April 15, 2001.   MEDICATIONS: Heparin 2000 units IV. The antibiotic was administered within 1 hour of the procedure.   ANESTHESIA/SEDATION: Versed 1 mg IV; Fentanyl 25 mcg IV   Moderate Sedation Time:  50 minutes   The patient was continuously monitored during the procedure by the interventional radiology nurse under my direct supervision.   CONTRAST:  Omnipaque 300 approximately 100 mL.   FLUOROSCOPY TIME:  Fluoroscopy Time: 15 minutes 20 seconds (701.7 mGy).    COMPLICATIONS: None immediate.   TECHNIQUE: Informed written consent was obtained from the patient after a thorough discussion of the procedural risks, benefits and alternatives. All questions were addressed. Maximal Sterile Barrier Technique was utilized including caps, mask, sterile gowns, sterile gloves, sterile drape, hand hygiene and skin antiseptic. A timeout was performed prior to the initiation of the procedure.   The right forearm to the wrist was prepped and draped in the usual sterile manner.   The right radial artery was then identified with ultrasound and its morphology documented. A dorsal palmar anastomosis was verified to be present. Using ultrasound guidance, and a micropuncture set access into the right radial artery was obtained over a 0.018 inch micro guidewire.   A 4/5 French radial sheath was then inserted. The micro guidewire, and the obturator were removed. Good aspiration obtained from the side port of the sheath. A cocktail of 2000 units heparin, 200 mcg nitroglycerin, and 2.5 mg of verapamil was then infused in diluted form through the sheath without event. A right radial arteriogram was then performed.   Over a 0.035 inch Roadrunner guidewire, a 5 [place] Simmons 2 diagnostic catheter, was advanced to the aortic arch region, and cannulations were performed of the right vertebral artery, the right common carotid artery, the left common carotid artery and the left vertebral artery.   After the procedure hemostasis at the wrist puncture site was achieved with a wrist band. Distal right radial pulse was verified to be present.   FINDINGS: The right vertebral artery origin is widely patent.   The vessel is seen to opacify to the cranial skull base. Patency is seen of the right vertebrobasilar junction and the right posterior-inferior cerebellar artery. The basilar artery, the posterior cerebral arteries, the superior cerebellar arteries and the anterior-inferior cerebellar arteries demonstrate  opacification into the capillary and venous phases. Unopacified blood is seen in the basilar artery from contralateral vertebral artery.   A dominant right transverse sinus sigmoid sinus is seen as well as a right internal jugular vein.   The left vertebral artery origin is widely patent.   The vessel is seen to opacify to the cranial skull base. The left vertebrobasilar junction and the left posterior-inferior cerebellar artery demonstrated normal opacification.   The basilar artery, the posterior  cerebral arteries, the superior cerebellar arteries and the anterior-inferior cerebellar arteries demonstrate wide patency into the capillary and venous phases. Unopacified blood is seen in the basilar artery from the contralateral vertebral artery.   The left common carotid arteriogram demonstrates the left external carotid artery and its major branches to be widely patent.   The left internal carotid artery at the bulb to the cranial skull base is widely patent with mild tortuosity in its proximal 1/3.   The petrous, cavernous and supraclinoid segments are widely patent.   The left middle cerebral artery and the left anterior cerebral artery opacify into the capillary and venous phases.   The right common carotid arteriogram demonstrates mild narrowing at the origin of the right external carotid artery. However, its branches opacified normally.   The right common carotid artery at the bulb to the cranial skull base is widely patent with a U-shaped configuration involving the proximal 1/3. The petrous, cavernous and supraclinoid segments are widely patent.   The right anterior cerebral artery opacifies into the capillary and venous phases. Transient opacification of the left anterior cerebral artery A2 segment and distally is seen via the anterior communicating artery.   Right middle cerebral artery and its proximal segment demonstrates a large lobulated wide neck aneurysm associated with fusiform component extending  just distal to the aneurysm.   The more distal branches of the right middle cerebral artery opacify into the capillary and venous phases.   A 3D rotational arteriogram was then performed via a right common carotid artery injection. Reformatted images did confirm the presence of a lobulated large wide neck aneurysm of the junction of the proximal and mid 1/3 of the right middle cerebral artery associated with a proximal fusiform dilatation.   Also demonstrated is an approximately 4 mm outpouching from the proximal right middle cerebral artery proximal to the large aneurysm which measures approximately 8.9 mm x 6.2 mm.   IMPRESSION: Large approximately 8.9 mm x 6.2 mm wide neck lobulated saccular aneurysm arising at the junction of the proximal and the middle 1/3 of the right middle cerebral artery. Saccular aneurysm arising from the proximal third of the right middle cerebral artery measuring approximately 4 mm with a wide neck.   Mild fusiform dilatation of the mid right middle cerebral artery measuring approximately 3.4 mm.   PLAN: Findings reviewed with the patient. Patient to return to discuss the above angiographic findings, and management considerations as soon as possible.     Electronically Signed   By: Luanne Bras M.D.   On: 01/05/2021 13:02    IR ANGIO VERTEBRAL SEL VERTEBRAL UNI R MOD SED  Result Date: 01/08/2021 CLINICAL DATA:  Patient with known history of a right middle cerebral artery aneurysm. Worsening headaches. Short-term memory difficulties. EXAM: IR ULTRASOUND GUIDANCE VASC ACCESS RIGHT COMPARISON:  Recent MRI MRA of the brain and previous diagnostic catheter arteriogram April 15, 2001. MEDICATIONS: Heparin 2000 units IV. The antibiotic was administered within 1 hour of the procedure. ANESTHESIA/SEDATION: Versed 1 mg IV; Fentanyl 25 mcg IV Moderate Sedation Time:  50 minutes The patient was continuously monitored during the procedure by the interventional radiology nurse under my  direct supervision. CONTRAST:  Omnipaque 300 approximately 100 mL. FLUOROSCOPY TIME:  Fluoroscopy Time: 15 minutes 20 seconds (701.7 mGy). COMPLICATIONS: None immediate. TECHNIQUE: Informed written consent was obtained from the patient after a thorough discussion of the procedural risks, benefits and alternatives. All questions were addressed. Maximal Sterile Barrier Technique was utilized including caps, mask,  sterile gowns, sterile gloves, sterile drape, hand hygiene and skin antiseptic. A timeout was performed prior to the initiation of the procedure. The right forearm to the wrist was prepped and draped in the usual sterile manner. The right radial artery was then identified with ultrasound and its morphology documented. A dorsal palmar anastomosis was verified to be present. Using ultrasound guidance, and a micropuncture set access into the right radial artery was obtained over a 0.018 inch micro guidewire. A 4/5 French radial sheath was then inserted. The micro guidewire, and the obturator were removed. Good aspiration obtained from the side port of the sheath. A cocktail of 2000 units heparin, 200 mcg nitroglycerin, and 2.5 mg of verapamil was then infused in diluted form through the sheath without event. A right radial arteriogram was then performed. Over a 0.035 inch Roadrunner guidewire, a 5 [place] Simmons 2 diagnostic catheter, was advanced to the aortic arch region, and cannulations were performed of the right vertebral artery, the right common carotid artery, the left common carotid artery and the left vertebral artery. After the procedure hemostasis at the wrist puncture site was achieved with a wrist band. Distal right radial pulse was verified to be present. FINDINGS: The right vertebral artery origin is widely patent. The vessel is seen to opacify to the cranial skull base. Patency is seen of the right vertebrobasilar junction and the right posterior-inferior cerebellar artery. The basilar artery,  the posterior cerebral arteries, the superior cerebellar arteries and the anterior-inferior cerebellar arteries demonstrate opacification into the capillary and venous phases. Unopacified blood is seen in the basilar artery from contralateral vertebral artery. A dominant right transverse sinus sigmoid sinus is seen as well as a right internal jugular vein. The left vertebral artery origin is widely patent. The vessel is seen to opacify to the cranial skull base. The left vertebrobasilar junction and the left posterior-inferior cerebellar artery demonstrated normal opacification. The basilar artery, the posterior cerebral arteries, the superior cerebellar arteries and the anterior-inferior cerebellar arteries demonstrate wide patency into the capillary and venous phases. Unopacified blood is seen in the basilar artery from the contralateral vertebral artery. The left common carotid arteriogram demonstrates the left external carotid artery and its major branches to be widely patent. The left internal carotid artery at the bulb to the cranial skull base is widely patent with mild tortuosity in its proximal 1/3. The petrous, cavernous and supraclinoid segments are widely patent. The left middle cerebral artery and the left anterior cerebral artery opacify into the capillary and venous phases. The right common carotid arteriogram demonstrates mild narrowing at the origin of the right external carotid artery. However, its branches opacified normally. The right common carotid artery at the bulb to the cranial skull base is widely patent with a U-shaped configuration involving the proximal 1/3. The petrous, cavernous and supraclinoid segments are widely patent. The right anterior cerebral artery opacifies into the capillary and venous phases. Transient opacification of the left anterior cerebral artery A2 segment and distally is seen via the anterior communicating artery. Right middle cerebral artery and its proximal  segment demonstrates a large lobulated wide neck aneurysm associated with fusiform component extending just distal to the aneurysm. The more distal branches of the right middle cerebral artery opacify into the capillary and venous phases. A 3D rotational arteriogram was then performed via a right common carotid artery injection. Reformatted images did confirm the presence of a lobulated large wide neck aneurysm of the junction of the proximal and mid 1/3 of the  right middle cerebral artery associated with a proximal fusiform dilatation. Also demonstrated is an approximately 4 mm outpouching from the proximal right middle cerebral artery proximal to the large aneurysm which measures approximately 8.9 mm x 6.2 mm. IMPRESSION: Large approximately 8.9 mm x 6.2 mm wide neck lobulated saccular aneurysm arising at the junction of the proximal and the middle 1/3 of the right middle cerebral artery. Saccular aneurysm arising from the proximal third of the right middle cerebral artery measuring approximately 4 mm with a wide neck. Mild fusiform dilatation of the mid right middle cerebral artery measuring approximately 3.4 mm. PLAN: Findings reviewed with the patient. Patient to return to discuss the above angiographic findings, and management considerations as soon as possible. Electronically Signed   By: Luanne Bras M.D.   On: 01/05/2021 13:02    Labs:  CBC: Recent Labs    03/24/20 1559 01/04/21 1004  WBC 10.4 5.0  HGB 14.2 14.0  HCT 41.4 43.8  PLT 184 180    COAGS: Recent Labs    01/04/21 1004  INR 1.0    BMP: Recent Labs    03/24/20 1559 01/04/21 1004  NA 142 140  K 4.0 4.4  CL 107* 107  CO2 26 27  GLUCOSE 73 94  BUN 12 11  CALCIUM 9.3 9.1  CREATININE 0.76 0.70  GFRNONAA 83 >60  GFRAA 96  --     LIVER FUNCTION TESTS: Recent Labs    03/24/20 1559  BILITOT 0.4  AST 35  ALT 22  ALKPHOS 89  PROT 7.2  ALBUMIN 4.4    TUMOR MARKERS: No results for input(s): AFPTM, CEA,  CA199, CHROMGRNA in the last 8760 hours.  Assessment and Plan: 65 y.o. female with known right MCA aneurysm, s/p unsuccessful craniotomy in 2002, who was referred to Dr. Estanislado Pandy by her neurologist due to new onset of right-sided headache and to discuss the lady of endovascular treatment for the right MCA aneurysm. Patient had a consultation visit with Dr. Estanislado Pandy on 12/22/2018, diagnostic cerebral arteriogram was performed on 7/18. Plan for angiogram with intervention/stenting across the aneurysm. COVID- P2Y12: 69 Labs pending. Pt will admitted overnight for observation in Neuro ICU.  Risks and benefits of cerebral angiogram with intervention were discussed with the patient including, but not limited to bleeding, infection, vascular injury, contrast induced renal failure, stroke or even death.  This interventional procedure involves the use of X-rays and because of the nature of the planned procedure, it is possible that we will have prolonged use of X-ray fluoroscopy.  Potential radiation risks to you include (but are not limited to) the following: - A slightly elevated risk for cancer  several years later in life. This risk is typically less than 0.5% percent. This risk is low in comparison to the normal incidence of human cancer, which is 33% for women and 50% for men according to the [place]. - Radiation induced injury can include skin redness, resembling a rash, tissue breakdown / ulcers and hair loss (which can be temporary or permanent).   The likelihood of either of these occurring depends on the difficulty of the procedure and whether you are sensitive to radiation due to previous procedures, disease, or genetic conditions.   IF your procedure requires a prolonged use of radiation, you will be notified and given written instructions for further action.  It is your responsibility to monitor the irradiated area for the 2 weeks following the procedure and to notify  your physician if you  are concerned that you have suffered a radiation induced injury.    All of the patient's questions were answered, patient is agreeable to proceed.  Consent signed and in chart.     Thank you for this interesting consult.  I greatly enjoyed meeting JINORA PERON and look forward to participating in their care.  A copy of this report was sent to the requesting provider on this date.  Electronically Signed: Ascencion Dike, PA-C 01/24/2021, 8:02 AM   I spent a total of 20 minutes in face to face in clinical consultation, greater than 50% of which was counseling/coordinating care for cerebral arteriogram with intervention.

## 2021-01-24 NOTE — Progress Notes (Signed)
[place] for heparin Indication:  Post neuro-IR intervention  Labs: Recent Labs    01/24/21 0659 01/24/21 1311 01/24/21 2200  HGB 13.0  --   --   HCT 39.7  --   --   PLT 145*  --   --   LABPROT 13.7  --   --   INR 1.1  --   --   HEPARINUNFRC  --  0.76* 0.16*  CREATININE 0.69  --   --     Assessment: 80 YOF with Rt MCA trunk aneurysm s/p pipeline shield flow diverter. Pharmacy consulted to start IV heparin.   Initial heparin level in goal range, 0.16 units/ml  Goal of Therapy:  Heparin level 0.1-0.25 units/ml Monitor platelets by anticoagulation protocol: Yes   Plan:  -Heparin 500 units/hr. Heparin drip to run until 8 AM tomorrow -Monitor for s/s of bleeding   Thanks for allowing pharmacy to be a part of this patient's care.  Excell Seltzer, PharmD Clinical Pharmacist

## 2021-01-24 NOTE — Anesthesia Preprocedure Evaluation (Signed)
Anesthesia Evaluation  Patient identified by MRN, date of birth, ID band Patient awake    Reviewed: Allergy & Precautions, NPO status , Patient's Chart, lab work & pertinent test results  Airway Mallampati: II  TM Distance: >3 FB Neck ROM: Full    Dental no notable dental hx.    Pulmonary neg pulmonary ROS, Current Smoker and Patient abstained from smoking.,    Pulmonary exam normal breath sounds clear to auscultation       Cardiovascular negative cardio ROS Normal cardiovascular exam Rhythm:Regular Rate:Normal     Neuro/Psych  Headaches, Anxiety Depression negative psych ROS   GI/Hepatic negative GI ROS, (+) Hepatitis -, Autoimmune  Endo/Other  negative endocrine ROS  Renal/GU negative Renal ROS  negative genitourinary   Musculoskeletal negative musculoskeletal ROS (+)   Abdominal   Peds negative pediatric ROS (+)  Hematology negative hematology ROS (+)   Anesthesia Other Findings   Reproductive/Obstetrics negative OB ROS                             Anesthesia Physical Anesthesia Plan  ASA: 3  Anesthesia Plan: General   Post-op Pain Management:    Induction: Intravenous  PONV Risk Score and Plan: 2 and Ondansetron, Midazolam and Treatment may vary due to age or medical condition  Airway Management Planned: Oral ETT  Additional Equipment:   Intra-op Plan:   Post-operative Plan: Extubation in OR  Informed Consent: I have reviewed the patients History and Physical, chart, labs and discussed the procedure including the risks, benefits and alternatives for the proposed anesthesia with the patient or authorized representative who has indicated his/her understanding and acceptance.     Dental advisory given  Plan Discussed with: CRNA  Anesthesia Plan Comments:         Anesthesia Quick Evaluation

## 2021-01-24 NOTE — Anesthesia Procedure Notes (Signed)
Arterial Line Insertion Start/End8/08/2020 8:40 AM, 01/24/2021 8:45 AM Performed by: Lynda Rainwater, MD, Georgia Duff, CRNA, CRNA  Preanesthetic checklist: patient identified and IV checked Lidocaine 1% used for infiltration and patient sedated Left, radial was placed Catheter size: 20 G  Attempts: 2 Procedure performed without using ultrasound guided technique. Following insertion, dressing applied and Biopatch. Post procedure assessment: normal  Patient tolerated the procedure well with no immediate complications.

## 2021-01-24 NOTE — Sedation Documentation (Signed)
Pt transported to PACU bay 13 accompanied by RN and CRNA. Remo Lipps RN at bedside to receive pt. Right radial TR Band site level 0. Pt drowsy but responds and moves all extremities to command. No s/s of distress at this time.

## 2021-01-24 NOTE — Anesthesia Postprocedure Evaluation (Signed)
Anesthesia Post Note  Patient: Elizabeth Phelps  Procedure(s) Performed: RADIOLOGY WITH ANESTHESIA EMBOLIZATION     Patient location during evaluation: PACU Anesthesia Type: General Level of consciousness: awake and alert Pain management: pain level controlled Vital Signs Assessment: post-procedure vital signs reviewed and stable Respiratory status: spontaneous breathing, nonlabored ventilation and respiratory function stable Cardiovascular status: blood pressure returned to baseline and stable Postop Assessment: no apparent nausea or vomiting Anesthetic complications: no   No notable events documented.  Last Vitals:  Vitals:   01/24/21 1230 01/24/21 1300  BP: (!) 114/59 (!) 101/51  Pulse: 72 81  Resp: 16 19  Temp: (!) 36.4 C   SpO2: 98% 94%    Last Pain:  Vitals:   01/24/21 1215  TempSrc:   PainSc: 0-No pain                 Lynda Rainwater

## 2021-01-24 NOTE — Progress Notes (Signed)
Patient is s/p RT common carotid arteriogram followed by embolization of complex Rt MCA trunk aneurysm with x1 4.5 mm x 30 mm pipeline shield  flow diverter via Rt rad approach with Dr. Estanislado Pandy today.   Patient was extubated and transferred to PACU then to NICU in stable condition.   Patient evaluated at the bedside with Dr. Estanislado Pandy. Patient laying in bed, not in acute distress. RN at the bedside.  Denies N/V/HA/ difficulty speaking. States that she is thirsty. Reports mild pain around the TR band on right wrist.   Alert, awake, and oriented x4 Speech and comprehension intact PERRL  EOMs intact, negative nystagmus or subjective diplopia. negative facial asymmetry. Tongue midline  Motor power intact all 4 Negative pronator drift. Fine motor and coordination intact Right radial pulse difficult to assess, pt still has TR band. Right hand warm.   Some blood noted under the TR band.  RN states that having trouble deflating the TR band. Per Dr. Estanislado Pandy, heparin to be held x 1 hour and attempt deflating TR band.  Heparin to be resumed at 4 pm.   Will re-evaluate patient tomorrow morning and d/c if stable.   Please call NIR for questions and concerns.   Armando Gang Ivelisse Culverhouse PA-C 01/24/2021 3:27 PM

## 2021-01-24 NOTE — Progress Notes (Signed)
ANTICOAGULATION CONSULT NOTE - Initial Consult  Pharmacy Consult for heparin Indication:  Post neuro-IR intervention  Allergies  Allergen Reactions   Codeine Nausea And Vomiting    Patient Measurements: Height: 5' 3.5" (161.3 cm) Weight: 61.2 kg (135 lb) IBW/kg (Calculated) : 53.55 Heparin Dosing Weight: 61 kg   Vital Signs: Temp: (P) 97.5 F (36.4 C) (08/03 1150) Temp Source: Oral (08/03 0640) BP: 128/74 (08/03 1145) Pulse Rate: 74 (08/03 1145)  Labs: Recent Labs    01/24/21 0659  HGB 13.0  HCT 39.7  PLT 145*  LABPROT 13.7  INR 1.1  CREATININE 0.69    Estimated Creatinine Clearance: 59.3 mL/min (by C-G formula based on SCr of 0.69 mg/dL).   Medical History: Past Medical History:  Diagnosis Date   Anemia    Arnold-Chiari malformation, type I    Atherosclerosis of aorta 10/02/2017   Attention and concentration deficit 09/02/2019   traits of ADHD at very least   Autoimmune hepatitis 08/04/2018   Patient with PBC and autoimmune hepatitis overlap on previous biopsy.   Patient has a negative AMA, ANA was positive in a dual nuclear and speckled pattern with a titer of 1:1280, ASMA was weakly positive at 27, and a mildly elevated IgG. Biopsy reported mild to moderate interface hepatitis.  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   Complication of anesthesia    hard to wake up after brain surgery in 2002   Diverticulosis 07/21/2020   Dysthymia 04/03/2012   Generalized anxiety disorder    History of colonic polyps 08/12/2016   Hyperlipidemia 08/12/2016   LDL goal <100   Incomplete right bundle branch block 09/22/2017   Major depressive disorder    Migraine headache    Primary biliary cholangitis 08/04/2018   Primary biliary cirrhosis    Smoker     Medications:  Medications Prior to Admission  Medication Sig Dispense Refill Last Dose   aspirin EC 81 MG tablet  Take 81 mg by mouth daily. Swallow whole.   01/23/2021   atorvastatin (LIPITOR) 40 MG tablet Take 1 tablet (40 mg total) by mouth daily. (Patient taking differently: Take 40 mg by mouth at bedtime.) 90 tablet 3 01/23/2021   azaTHIOprine (IMURAN) 50 MG tablet Take 100 mg by mouth at bedtime.   01/23/2021   clopidogrel (PLAVIX) 75 MG tablet Take 1 tablet (75 mg total) by mouth daily. 30 tablet 2 01/24/2021 at 0500   ibuprofen (ADVIL) 200 MG tablet Take 400 mg by mouth every 6 (six) hours as needed for headache.   Past Month   Multiple Vitamin (MULTIVITAMIN WITH MINERALS) TABS Take 1 tablet by mouth at bedtime.   Past Week   OCALIVA 5 MG TABS Take 5 mg by mouth at bedtime.   01/23/2021   PARoxetine (PAXIL) 40 MG tablet Take 1 tablet (40 mg total) by mouth daily. (Patient taking differently: Take 40 mg by mouth at bedtime.) 90 tablet 1 01/23/2021   zolmitriptan (ZOMIG) 5 MG tablet Take 1 tablet (5 mg total) by mouth as needed for migraine. 10 tablet 11 Past Week    Assessment: 68 YOF with Rt MCA trunk aneurysm s/p pipeline shield flow diverter. Pharmacy consulted to start IV heparin.   H/H wnl, Plt low, SCr wnl   Goal of Therapy:  Heparin level 0.1-0.25 units/ml Monitor platelets by anticoagulation protocol: Yes   Plan:  -Heparin 500 units/hr. No bolus.  Heparin drip to run until 8 AM tomorrow -F/u 6 hr HL -Monitor for s/s of bleeding   Albertina Parr, PharmD., BCPS, BCCCP Clinical Pharmacist Please refer to [facility] for unit-specific pharmacist

## 2021-01-24 NOTE — Procedures (Signed)
S/P RT common carotid arteriogram followed by embolization of complex Rt MCA trunk aneurysm with x1 4.5 mm x 30 mm pipeline shield  flow diverter. Rt rad approach.. Post CT brain no ICH  Extubated  Groggy  Following simple commands appropriately. Pupils 31m RT = Lt sluggish. No facial asymmetry. Tongue midline. Moves all 4s to command and spontaneously. Rt rad pulse intact. S.Briony Parveen MD

## 2021-01-25 ENCOUNTER — Encounter (HOSPITAL_COMMUNITY): Payer: Self-pay | Admitting: Interventional Radiology

## 2021-01-25 DIAGNOSIS — Z9889 Other specified postprocedural states: Secondary | ICD-10-CM | POA: Diagnosis not present

## 2021-01-25 DIAGNOSIS — I671 Cerebral aneurysm, nonruptured: Secondary | ICD-10-CM | POA: Diagnosis not present

## 2021-01-25 LAB — CBC WITH DIFFERENTIAL/PLATELET
Abs Immature Granulocytes: 0.03 10*3/uL (ref 0.00–0.07)
Basophils Absolute: 0 10*3/uL (ref 0.0–0.1)
Basophils Relative: 0 %
Eosinophils Absolute: 0 10*3/uL (ref 0.0–0.5)
Eosinophils Relative: 0 %
HCT: 32.8 % — ABNORMAL LOW (ref 36.0–46.0)
Hemoglobin: 10.5 g/dL — ABNORMAL LOW (ref 12.0–15.0)
Immature Granulocytes: 0 %
Lymphocytes Relative: 16 %
Lymphs Abs: 1.4 10*3/uL (ref 0.7–4.0)
MCH: 29.2 pg (ref 26.0–34.0)
MCHC: 32 g/dL (ref 30.0–36.0)
MCV: 91.1 fL (ref 80.0–100.0)
Monocytes Absolute: 0.8 10*3/uL (ref 0.1–1.0)
Monocytes Relative: 9 %
Neutro Abs: 6.7 10*3/uL (ref 1.7–7.7)
Neutrophils Relative %: 75 %
Platelets: 109 10*3/uL — ABNORMAL LOW (ref 150–400)
RBC: 3.6 MIL/uL — ABNORMAL LOW (ref 3.87–5.11)
RDW: 14 % (ref 11.5–15.5)
WBC: 9 10*3/uL (ref 4.0–10.5)
nRBC: 0 % (ref 0.0–0.2)

## 2021-01-25 LAB — BASIC METABOLIC PANEL
Anion gap: 4 — ABNORMAL LOW (ref 5–15)
BUN: 11 mg/dL (ref 8–23)
CO2: 22 mmol/L (ref 22–32)
Calcium: 8.3 mg/dL — ABNORMAL LOW (ref 8.9–10.3)
Chloride: 112 mmol/L — ABNORMAL HIGH (ref 98–111)
Creatinine, Ser: 0.64 mg/dL (ref 0.44–1.00)
GFR, Estimated: >60 mL/min (ref >60)
Glucose, Bld: 102 mg/dL — ABNORMAL HIGH (ref 70–99)
Potassium: 4.1 mmol/L (ref 3.5–5.1)
Sodium: 138 mmol/L (ref 135–145)

## 2021-01-25 MED ORDER — SODIUM CHLORIDE 0.9 % IV BOLUS: 250.0000 mL | Freq: Once | INTRAVENOUS | Status: DC

## 2021-01-25 NOTE — Discharge Planning (Signed)
Pt has ambulated, eaten and voided. All discharge instructions given, including when to f/u with Dr D, and s/s of stroke. Pt understands when to take next medications. Written copy of d/c instructions given. All questions answered.

## 2021-01-25 NOTE — Progress Notes (Signed)
PT Screen and Discharge  Patient Details Name: Elizabeth Phelps MRN: [id number] DOB: 07-23-1955   Cancelled Treatment:    Reason Eval/Treat Not Completed: PT screened, no needs identified, will sign off. After discussion with the pt regarding her mobility thus far, she feels she is at her baseline and ambulating independently. The pt states she has good support lined up following d/c, no acute PT needs identified at this time, thank you for the consult. Please feel free to re-consult if change in status.   Inocencio Homes, PT, DPT   Acute Rehabilitation Department Pager #: [phone]   Otho Bellows 01/25/2021, 9:43 AM

## 2021-01-25 NOTE — Discharge Summary (Addendum)
Patient ID: Elizabeth Phelps MRN: [id number] DOB/AGE: 1956/01/10 65 y.o.  Admit date: 01/24/2021 Discharge date: 01/25/2021  Supervising Physician: Luanne Bras  Patient Status: [facility] - In-pt  Admission Diagnoses: Cerebral aneurysm  Discharge Diagnoses:  Active Problems:   Middle cerebral artery aneurysm   Discharged Condition: good  Hospital Course:   Elizabeth Phelps is a 65 y.o. female with past medical history of HLD, arteriosclerosis of aorta, autoimmune hepatitis, PBC, GAD, MDD, incomplete right bundle branch block, Arnold-Chiari malformation type I, and right MCA aneurysm s/p unsuccessful craniotomy in 2002.  Patient was first seen by neurology on 09/07/2020 due to memory loss, underwent MRI C-spine and brain with and without and MRA on 05/30/2019 which showed no acute intracranial abnormality with stable combination of fusiform and saccular right MCA aneurysm originating from the right MI segment.  Patient had a follow-up visit with neurology on 12/12/2020, and she was referred to Dr. Estanislado Pandy for new onset of right-sided headache.   Patient had a consultation visit with Dr. Estanislado Pandy on 12/22/2018, diagnostic cerebral arteriogram was performed on 7/18.  Yesterday she underwent successful RT common carotid arteriogram followed by embolization of complex Rt MCA trunk aneurysm with x1 4.5 mm x 30 mm pipeline shield  flow diverter by Dr. Estanislado Pandy.  She is doing well today. She is tolerating a diet.   She only c/o her head "feeling funny like someone is squeezing it".   Consults: None  Significant Diagnostic Studies:   Lab Results  Component Value Date   WBC 9.0 01/25/2021   HGB 10.5 (L) 01/25/2021   HCT 32.8 (L) 01/25/2021   MCV 91.1 01/25/2021   PLT 109 (L) 01/25/2021   Lab Results  Component Value Date   CREATININE 0.64 01/25/2021   BUN 11 01/25/2021   NA 138 01/25/2021   K 4.1 01/25/2021   CL 112 (H) 01/25/2021   CO2 22 01/25/2021     Treatments:   S/P RT common carotid arteriogram followed by embolization of complex Rt MCA trunk aneurysm with x1 4.5 mm x 30 mm pipeline shield  flow diverter. Rt rad approach.. Post CT brain no ICH  Discharge Exam: Blood pressure 97/70, pulse 60, temperature 98.1 F (36.7 C), temperature source Oral, resp. rate (!) 9, height 5' 3.5" (1.613 m), weight 61.2 kg, SpO2 99 %. Alert, awake, and oriented x3. Speech and comprehension intact. PERRL bilaterally. EOMs intact bilaterally without nystagmus or subjective diplopia. Visual fields intact bilaterally. No facial asymmetry. Tongue midline. Motor power symmetric proportional to effort. No pronator drift. Fine motor and coordination intact and symmetric. 5/5 Strength bilaterally Radial artery puncture site looks good, no bleeding, no hematoma, no pseudoaneurysm Gait not assessed. Romberg not assessed. Heel to toe not assessed. Distal pulses palpable with Doppler bilaterally.    Disposition: Discharge disposition: 01-Home or Self Care      NIR will call patient with follow up appointment details.  Discharge Instructions     Call MD for:  difficulty breathing, headache or visual disturbances   Complete by: As directed    Call MD for:  persistant dizziness or light-headedness   Complete by: As directed    Call MD for:  persistant nausea and vomiting   Complete by: As directed    Call MD for:  redness, tenderness, or signs of infection (pain, swelling, redness, odor or green/yellow discharge around incision site)   Complete by: As directed    Call MD for:  severe uncontrolled pain   Complete by: As  directed    Call MD for:  temperature >100.4   Complete by: As directed    Diet general   Complete by: As directed    Discharge wound care:   Complete by: As directed    Remove dressing from radial stick tomorrow. No other dressings needed.   Increase activity slowly   Complete by: As directed    NO driving for 2 weeks, may ride in a car.  NO heavy lifting over 10 pounds for 2 weeks. NO strenuous activity for 2 weeks.      Allergies as of 01/25/2021       Reactions   Codeine Nausea And Vomiting        Medication List     TAKE these medications    aspirin EC 81 MG tablet Take 81 mg by mouth daily. Swallow whole.   azaTHIOprine 50 MG tablet Commonly known as: IMURAN Take 100 mg by mouth at bedtime.   clopidogrel 75 MG tablet Commonly known as: Plavix Take 1 tablet (75 mg total) by mouth daily.   ibuprofen 200 MG tablet Commonly known as: ADVIL Take 400 mg by mouth every 6 (six) hours as needed for headache.   multivitamin with minerals Tabs tablet Take 1 tablet by mouth at bedtime.   Ocaliva 5 MG Tabs Generic drug: Obeticholic Acid Take 5 mg by mouth at bedtime.   zolmitriptan 5 MG tablet Commonly known as: ZOMIG Take 1 tablet (5 mg total) by mouth as needed for migraine.       ASK your doctor about these medications    atorvastatin 40 MG tablet Commonly known as: LIPITOR Take 1 tablet (40 mg total) by mouth daily.   PARoxetine 40 MG tablet Commonly known as: PAXIL Take 1 tablet (40 mg total) by mouth daily.               Discharge Care Instructions  (From admission, onward)           Start     Ordered   01/25/21 0000  Discharge wound care:       Comments: Remove dressing from radial stick tomorrow. No other dressings needed.   01/25/21 0847              Electronically Signed: Murrell Redden, PA-C 01/25/2021, 8:47 AM     I have spent Greater Than 30 Minutes discharging [name].

## 2021-01-25 NOTE — Progress Notes (Signed)
Pt ride has arrived. Pt wheeled to car by volunteer.

## 2021-01-26 ENCOUNTER — Telehealth: Payer: Self-pay

## 2021-01-26 ENCOUNTER — Other Ambulatory Visit: Payer: Self-pay

## 2021-01-26 LAB — PLATELET INHIBITION P2Y12: Platelet Function  P2Y12: 72 [PRU] — ABNORMAL LOW (ref 182–335)

## 2021-01-26 NOTE — Telephone Encounter (Signed)
Transition Care Management Follow-up Telephone Call Date of discharge and from where: 01/25/2021-[place]  How have you been since you were released from the hospital? Patient stated she's feeling ok but she's very fatigue. Any questions or concerns? No  Items Reviewed: Did the pt receive and understand the discharge instructions provided? Yes  Medications obtained and verified?  No medications given at discharge Other? No  Any new allergies since your discharge? No  Dietary orders reviewed? N/A Do you have support at home? Yes   Home Care and Equipment/Supplies: Were home health services ordered? not applicable If so, what is the name of the agency? N/A  Has the agency set up a time to come to the patient's home? not applicable Were any new equipment or medical supplies ordered?  No What is the name of the medical supply agency? N/A Were you able to get the supplies/equipment? not applicable Do you have any questions related to the use of the equipment or supplies? No  Functional Questionnaire: (I = Independent and D = Dependent) ADLs: I  Bathing/Dressing- I  Meal Prep- I  Eating- I  Maintaining continence- I  Transferring/Ambulation- I  Managing Meds- I  Follow up appointments reviewed:  PCP Hospital f/u appt confirmed? No   Specialist Hospital f/u appt confirmed? No   Are transportation arrangements needed? No  If their condition worsens, is the pt aware to call PCP or go to the Emergency Dept.? Yes Was the patient provided with contact information for the PCP's office or ED? Yes Was to pt encouraged to call back with questions or concerns? Yes

## 2021-01-26 NOTE — Patient Instructions (Signed)
Visit Information  Ms. Mckeag was given information about Medicaid Managed Care team care coordination services as a part of their [facility] Medicaid benefit. [name] verbally consented to engagement with the [facility] Managed Care team.   If you are experiencing a medical emergency, please call 911 or report to your local emergency department or urgent care.   If you have a non-emergency medical problem during routine business hours, please contact your provider's office and ask to speak with a nurse.   For questions related to your [facility] health plan, please call: [phone] or go here:[email or web address]  If you would like to schedule transportation through your [facility] plan, please call the following number at least 2 days in advance of your appointment: [phone].  Call the [facility] at [phone], at any time, 24 hours a day, 7 days a week. If you are in danger or need immediate medical attention call 911.  If you would like help to quit smoking, call 1-800-QUIT-NOW 765-125-6968) OR Espaol: 1-855-Djelo-Ya HD:1601594) o para ms informacin haga clic aqu or Text READY to 200-400 to register via text  Ms. Sudhoff - following are the goals we discussed in your visit today:   Goals Addressed   None     Please see education materials related to Lipids provided as print materials.   Patient verbalizes understanding of instructions provided today.   The Managed Medicaid care management team will reach out to the patient again over the next 90 days.   [name], Pharm.D., Managed Medicaid Pharmacist [phone]   Following is a copy of your plan of care:  Patient Care Plan: General Plan of Care (Adult)     Problem Identified: Health Promotion or Disease Self-Management (General Plan of Care) Resolved 11/07/2020  Priority: High  Onset Date: 07/17/2020     Patient Care Plan: General Plan of Care  (Adult)     Problem Identified: Health Promotion or Disease Self-Management (General Plan of Care) Resolved 11/07/2020  Priority: High  Onset Date: 07/17/2020     Goal: Self-Management Plan Developed Completed 11/07/2020  Start Date: 07/17/2020  Expected End Date: 11/07/2020  This Visit's Progress: Not on track  Priority: High  Note:   Current Barriers:  Chronic Disease Management support and education needs.  Patient presented to [facility] over the weekend via ambulance for RUQ pain, diagnosed with UTI.  RUQ pain continues. Update 11/07/20:  Unable to maintain contact with patient.  Nurse Case Manager Clinical Goal(s):  Over the next 30 days, patient will work with PCP to address needs related to RUQ pain. Over the next 90 days, patient will attend all scheduled medical appointments: Over the next 30 days patient will work with CM team pharmacist to review medications.  Interventions:  Inter-disciplinary care team collaboration (see longitudinal plan of care) Advised patient to contact PCP. Reviewed medications with patient. Collaborated with Pharmacist regarding medications. Discussed plans with patient for ongoing care management follow up and provided patient with direct contact information for care management team Pharmacy referral for medication review.  Patient Goals/Self-Care Activities Over the next 30 days, patient will:  Self administers medications as prescribed Attends all scheduled provider appointments Calls pharmacy for medication refills Calls provider office for new concerns or questions  Follow Up Plan: The Managed Medicaid care management team will reach out to the patient again over the next 7 days.  The patient has been provided with contact information for the Managed Medicaid care management team and has  been advised to call with any health related questions or concerns.        Task: Mutually Develop and Royce Macadamia Achievement of Patient Goals  Completed 11/07/2020  Note:   Care Management Activities:    - verbalization of feelings encouraged    Notes:     Patient Care Plan: Medication Management     Problem Identified: Health Promotion or Disease Self-Management (General Plan of Care)      Goal: Medication Management   Note:   Current Barriers:  Unable to self administer medications as prescribed Does not adhere to prescribed medication regimen Does not maintain contact with provider office Does not contact provider office for questions/concerns   Pharmacist Clinical Goal(s):  Over the next 90 days, patient will achieve adherence to monitoring guidelines and medication adherence to achieve therapeutic efficacy contact provider office for questions/concerns as evidenced notation of same in electronic health record through collaboration with PharmD and provider.    Interventions: Inter-disciplinary care team collaboration (see longitudinal plan of care) Comprehensive medication review performed; medication list updated in electronic medical record  '@[facility]'$ @  Patient Goals/Self-Care Activities Over the next 90 days, patient will:  - take medications as prescribed collaborate with provider on medication access solutions  Follow Up Plan: The care management team will reach out to the patient again over the next 90 days.      Task: Mutually Develop and Royce Macadamia Achievement of Patient Goals   Note:   Care Management Activities:    - verbalization of feelings encouraged    Notes:

## 2021-01-26 NOTE — Patient Outreach (Signed)
Medicaid Managed Care    Pharmacy Note  01/26/2021 Name: Elizabeth Phelps MRN: [id number] DOB: 1955/07/21  Elizabeth Phelps is a 65 y.o. year old female who is a primary care patient of Denita Lung, MD. The [facility] Managed Care Coordination team was consulted for assistance with disease management and care coordination needs.    Engaged with patient Engaged with patient by telephone for initial visit in response to referral for case management and/or care coordination services.  [name] was given information about Managed Medicaid Care Coordination team services today. Elizabeth Phelps agreed to services and verbal consent obtained.   Objective:  Lab Results  Component Value Date   CREATININE 0.64 01/25/2021   CREATININE 0.69 01/24/2021   CREATININE 0.70 01/04/2021    No results found for: HGBA1C     Component Value Date/Time   CHOL 211 (H) 03/24/2020 1559   TRIG 117 03/24/2020 1559   HDL 69 03/24/2020 1559   CHOLHDL 3.1 03/24/2020 1559   CHOLHDL 3.5 11/05/2016 0736   VLDL 23 11/05/2016 0736   LDLCALC 121 (H) 03/24/2020 1559    Other: (TSH, CBC, Vit D, etc.)  Clinical ASCVD: No  The 10-year ASCVD risk score [name] DC Jr., et al., 2013) is: 5%   Values used to calculate the score:     Age: 6 years     Sex: Female     Is Non-Hispanic African American: No     Diabetic: No     Tobacco smoker: Yes     Systolic Blood Pressure: 91 mmHg     Is BP treated: No     HDL Cholesterol: 69 mg/dL     Total Cholesterol: 211 mg/dL    Other: (CHADS2VASc if Afib, PHQ9 if depression, MMRC or CAT for COPD, ACT, DEXA)  BP Readings from Last 3 Encounters:  01/25/21 91/63  01/04/21 115/87  12/18/20 105/72    Assessment/Interventions: Review of patient past medical history, allergies, medications, health status, including review of consultants reports, laboratory and other test data, was performed as part of comprehensive evaluation and provision of chronic care management  services.    Mood Gad 7 : 11/24/20: 21 Paroxetine Tried/Failed: Aripiprazole (messed with sleep cycle and amped her up) Feb 2022: Wants to see Page (Other Mental Health person) next week to make sure Abilify is OK -Went to [employer] (Referred from neuro), upset that Psyc put her on Abilify after 30 minutes of speaking while dr's 79 year old kid screamed in the background Plan: Did not pressure patient to take medication. Told her risks, ADR' and recommended she run it by Page and her PCP whom she has a great relationship with both June 2022: Specialist increased to '40mg'$  Paroxitine, patient states it helps a little but that the thing that helps the best is Marijana. She is adamant that she doesn't want to quit. Patient kept talking about "[employer]" and how she isn't going to be a [place] pig. Is open to getting a new counselor, will coordinate with PCP to get someone to talk with. Woman named "Darryl" in 2006 was who patient really liked. Patient speaks very highly of Dr. Redmond School.   August 2022: She is unable to see the counselor she wanted due to insurance but she was referred to someone else she's excited about. However, because she had a procedure last week (Stated has 6 aneurisms removed) she wanted to hold off on scheduling (They called last week to schedule and she delayed).  I encouraged her to call them next week to be seen ASAP   Lipids Lab Results  Component Value Date   CHOL 211 (H) 03/24/2020   CHOL 178 06/10/2019   CHOL 154 09/22/2017   Lab Results  Component Value Date   HDL 69 03/24/2020   HDL 56 06/10/2019   HDL 43 09/22/2017   Lab Results  Component Value Date   LDLCALC 121 (H) 03/24/2020   LDLCALC 105 (H) 06/10/2019   LDLCALC 70 09/22/2017   Lab Results  Component Value Date   TRIG 117 03/24/2020   TRIG 96 06/10/2019   TRIG 206 (H) 09/22/2017   Lab Results  Component Value Date   CHOLHDL 3.1 03/24/2020   CHOLHDL 3.2 06/10/2019   CHOLHDL 3.6  09/22/2017   No results found for: LDLDIRECT Atorvastatin '40mg'$  Plan: At goal after PCP increased in Feb 2022   Rejection  Azathioprine Plan: At goal,  patient stable/ symptoms controlled   PBC Alvino Blood ([place] from [facility] because no one in [place] can get it) Plan: At goal,  patient stable/ symptoms controlled   Pain Tramadol (Not taking anymore)  Migraines -Smokes Pot for migraine Zolmitriptan Plan: At goal,  patient stable/ symptoms controlled   Meds: -Likes going to Pharmacy because only time to go out  SDOH (Social Determinants of Health) assessments and interventions performed:    Care Plan  Allergies  Allergen Reactions   Codeine Nausea And Vomiting    Medications Reviewed Today     Reviewed by Kayleen Memos, RN (Registered Nurse) on 01/24/21 at [id number]  Med List Status: Complete   Medication Order Taking? Sig Documenting Provider Last Dose Status Informant  aspirin EC 81 MG tablet [id number] Yes Take 81 mg by mouth daily. Swallow whole. [provider] 01/23/2021 Active Self  atorvastatin (LIPITOR) 40 MG tablet [id number] Yes Take 1 tablet (40 mg total) by mouth daily.  Patient taking differently: Take 40 mg by mouth at bedtime.   Denita Lung, MD 01/23/2021 Active   azaTHIOprine (IMURAN) 50 MG tablet [id number] Yes Take 100 mg by mouth at bedtime. [provider] 01/23/2021 Active Self  clopidogrel (PLAVIX) 75 MG tablet [id number] Yes Take 1 tablet (75 mg total) by mouth daily. Ascencion Dike, PA-C 01/24/2021 0500 Active Self  ibuprofen (ADVIL) 200 MG tablet [id number] Yes Take 400 mg by mouth every 6 (six) hours as needed for headache. [provider] Past Month Active Self  Multiple Vitamin (MULTIVITAMIN WITH MINERALS) TABS [id number] Yes Take 1 tablet by mouth at bedtime. [provider] Past Week Active Self  OCALIVA 5 MG TABS [id number] Yes Take 5 mg by mouth at bedtime. [provider] 01/23/2021 Active  Self  PARoxetine (PAXIL) 40 MG tablet [id number] Yes Take 1 tablet (40 mg total) by mouth daily.  Patient taking differently: Take 40 mg by mouth at bedtime.   Denita Lung, MD 01/23/2021 Active Self  zolmitriptan (ZOMIG) 5 MG tablet [id number] Yes Take 1 tablet (5 mg total) by mouth as needed for migraine. Denita Lung, MD Past Week Active Self            Patient Active Problem List   Diagnosis Date Noted   Middle cerebral artery aneurysm 01/24/2021   Primary biliary cholangitis 12/05/2020   Diverticulosis 07/21/2020   Major depressive disorder    Generalized anxiety disorder    Cerebral aneurysm without rupture    Attention and concentration deficit 09/02/2019   Autoimmune hepatitis 08/04/2018  Atherosclerosis of aorta 10/02/2017   Incomplete right bundle branch block 09/22/2017   Arnold-Chiari malformation, type I 08/12/2016   Hyperlipidemia LDL goal <100 08/12/2016   History of colonic polyps 08/12/2016   Current smoker 05/25/2013   Migraine headache 01/08/2013    Conditions to be addressed/monitored: Depression   Care Plan : Medication Management  Updates made by Lane Hacker, [place] since 08/03/2020 12:00 AM     Problem: Health Promotion or Disease Self-Management (General Plan of Care)      Goal: Medication Management   Note:   Current Barriers:  Unable to self administer medications as prescribed Does not adhere to prescribed medication regimen Does not maintain contact with provider office Does not contact provider office for questions/concerns   Pharmacist Clinical Goal(s):  Over the next 90 days, patient will achieve adherence to monitoring guidelines and medication adherence to achieve therapeutic efficacy contact provider office for questions/concerns as evidenced notation of same in electronic health record through collaboration with PharmD and provider.    Interventions: Inter-disciplinary care team collaboration (see longitudinal plan of  care) Comprehensive medication review performed; medication list updated in electronic medical record  '@[facility]'$ @  Patient Goals/Self-Care Activities Over the next 90 days, patient will:  - take medications as prescribed collaborate with provider on medication access solutions  Follow Up Plan: The care management team will reach out to the patient again over the next 90 days.      Task: Mutually Develop and Royce Macadamia Achievement of Patient Goals   Note:   Care Management Activities:    - verbalization of feelings encouraged    Notes:      Medication Assistance: None required. Patient affirms current coverage meets needs.   Follow up: Agree   Plan: The care management team will reach out to the patient again over the next 90 days.   [name], Pharm.D., Managed Medicaid Pharmacist - [phone]

## 2021-01-30 ENCOUNTER — Telehealth: Payer: Self-pay

## 2021-01-30 ENCOUNTER — Other Ambulatory Visit (HOSPITAL_COMMUNITY): Payer: Self-pay | Admitting: Interventional Radiology

## 2021-01-30 DIAGNOSIS — I671 Cerebral aneurysm, nonruptured: Secondary | ICD-10-CM

## 2021-01-30 NOTE — Telephone Encounter (Signed)
Pt. Called back to schedule hospital f/u which I got her scheduled on 02/01/21. She then started crying on the phone stating her medications were all messed up. She said the psyc she was referred to by [place] she did not like at all. She changed her paxil from 20 to 40 mg and she just doesn't trust her. She stated that you took over filling her paxil but refilled it at the 40 mg and it is not working for her. SHE WANTED TO KNOW IF THERE WAS ANYWAY AT YOU COULD CALL HER TODAY. She was crying on the phone stating she just needed to talk to you she is struggling right now.

## 2021-02-01 ENCOUNTER — Other Ambulatory Visit: Payer: Self-pay

## 2021-02-01 ENCOUNTER — Encounter: Payer: Self-pay | Admitting: Family Medicine

## 2021-02-01 ENCOUNTER — Ambulatory Visit (INDEPENDENT_AMBULATORY_CARE_PROVIDER_SITE_OTHER): Payer: Medicare Other | Admitting: Family Medicine

## 2021-02-01 VITALS — BP 110/72 | HR 82 | Temp 97.1°F | Wt 136.4 lb

## 2021-02-01 DIAGNOSIS — F411 Generalized anxiety disorder: Secondary | ICD-10-CM

## 2021-02-01 DIAGNOSIS — F17200 Nicotine dependence, unspecified, uncomplicated: Secondary | ICD-10-CM

## 2021-02-01 DIAGNOSIS — I671 Cerebral aneurysm, nonruptured: Secondary | ICD-10-CM | POA: Diagnosis not present

## 2021-02-01 MED ORDER — PAROXETINE HCL 20 MG PO TABS: 20.0000 mg | ORAL_TABLET | Freq: Every day | ORAL | 1 refills | Status: DC

## 2021-02-01 NOTE — Patient Instructions (Signed)
Give yourself positive things to do and start asking why questions to yourself

## 2021-02-01 NOTE — Progress Notes (Signed)
   Subjective:    Patient ID: Elizabeth Phelps, female    DOB: 08/22/1955, 65 y.o.   MRN: [id number]  HPI She is here for follow-up visit.  She recently had middle cerebral aneurysm stenting placed.  She seems to be doing quite nicely from that.  She has no headache, blurred vision, double vision, weakness numbness or tingling. She does have an underlying history of anxiety and thinks that since going up to the 40 mg of Paxil, this is interfered with her sleep patterns.  She would like to decrease that.  She also has started a smoking cessation program and is now tapered down to 5 cigarettes/day.   Review of Systems     Objective:   Physical Exam Alert and in no distress otherwise not examined       Assessment & Plan:   Generalized anxiety disorder - Plan: PARoxetine (PAXIL) 20 MG tablet  Middle cerebral artery aneurysm  Current smoker She is scheduled for follow-up for counseling and I recommended she do that roughly a month from now to see if the decrease in the Paxil has had a positive effect. I complemented her on her smoking cessation and gave further tips on positive interaction to help with this and with anxiety.  She will follow-up with me in roughly 1 month to see how this is going.

## 2021-02-07 ENCOUNTER — Other Ambulatory Visit: Payer: Self-pay

## 2021-02-07 ENCOUNTER — Ambulatory Visit (HOSPITAL_COMMUNITY)
Admission: RE | Admit: 2021-02-07 | Discharge: 2021-02-07 | Disposition: A | Discharge status: Home / Self Care | Payer: Medicare Other | Source: Ambulatory Visit | Attending: Interventional Radiology | Admitting: Interventional Radiology

## 2021-02-07 DIAGNOSIS — I671 Cerebral aneurysm, nonruptured: Secondary | ICD-10-CM | POA: Diagnosis not present

## 2021-02-07 DIAGNOSIS — Z9889 Other specified postprocedural states: Secondary | ICD-10-CM | POA: Diagnosis not present

## 2021-02-08 HISTORY — PX: IR RADIOLOGIST EVAL & MGMT: IMG5224

## 2021-02-14 ENCOUNTER — Ambulatory Visit: Payer: Medicare Other | Admitting: Psychology

## 2021-04-17 ENCOUNTER — Telehealth: Payer: Self-pay | Admitting: Student

## 2021-04-17 MED ORDER — CLOPIDOGREL BISULFATE 75 MG PO TABS: 75.0000 mg | ORAL_TABLET | Freq: Every day | ORAL | 2 refills | Status: DC

## 2021-04-17 NOTE — Telephone Encounter (Signed)
Plavix refill e-prescribed to the [facility] listed in the patient's account. 75 mg tablet, one tablet per day x 30 days with two refills.   Soyla Dryer, [place] [phone] 04/17/2021, 9:57 AM

## 2021-04-27 ENCOUNTER — Ambulatory Visit: Payer: Self-pay

## 2021-04-27 ENCOUNTER — Telehealth: Payer: Self-pay | Admitting: Pharmacist

## 2021-04-27 NOTE — Patient Outreach (Signed)
04/27/2021 Name: Elizabeth Phelps MRN: [id number] DOB: 03-16-1956  Referred by: Denita Lung, MD Reason for referral : No chief complaint on file.   An unsuccessful telephone outreach was attempted today. The patient was referred to the case management team for assistance with care management and care coordination.    Follow Up Plan: A HIPAA compliant phone message was left for the patient providing contact information and requesting a return call. and The Managed Medicaid care management team will reach out to the patient again over the next 10 days.   Hughes Better PharmD, CPP High Risk Managed Medicaid [place] [phone]

## 2021-05-22 ENCOUNTER — Ambulatory Visit: Payer: Medicare Other | Admitting: Gastroenterology

## 2021-05-22 ENCOUNTER — Encounter: Payer: Self-pay | Admitting: Family Medicine

## 2021-05-22 ENCOUNTER — Telehealth (INDEPENDENT_AMBULATORY_CARE_PROVIDER_SITE_OTHER): Payer: Medicare Other | Admitting: Family Medicine

## 2021-05-22 VITALS — Temp 99.0°F | Wt 136.0 lb

## 2021-05-22 DIAGNOSIS — U071 COVID-19: Secondary | ICD-10-CM | POA: Diagnosis not present

## 2021-05-22 NOTE — Progress Notes (Signed)
   Subjective:    Patient ID: Elizabeth Phelps, female    DOB: 05/12/1956, 65 y.o.   MRN: [id number]  HPI Documentation for virtual audio and video telecommunications through [place] encounte15r: The patient was located at home. 2 patient identifiers used.  The provider was located in the office. The patient did consent to this visit and is aware of possible charges through their insurance for this visit. The other persons participating in this telemedicine service were none. Time spent on call was 4 minutes and in review of previous records >16 minutes total for counseling and coordination of care. This virtual service is not related to other E/M service within previous 7 days.  She was diagnosed with COVID last Thursday with difficulty with fever, chills and myalgias.  Several days later she started to feel better but today she had some more difficulty with chest pressure as well as a slight cough and metallic taste in her mouth but overall is feeling much better.  No fever or shortness of breath.  Review of Systems     Objective:   Physical Exam Alert and in no distress with normal breathing pattern.       Assessment & Plan:   COVID-19 I explained that this is not unusual and I think she is heading in the right direction.  Recommend continuing with Tylenol as well as use Delsym.  Of note is the fact that she also has quit smoking which I complemented her on.

## 2021-06-08 ENCOUNTER — Ambulatory Visit: Payer: Medicare Other | Admitting: Gastroenterology

## 2021-06-08 DIAGNOSIS — K754 Autoimmune hepatitis: Secondary | ICD-10-CM | POA: Diagnosis not present

## 2021-06-08 DIAGNOSIS — K743 Primary biliary cirrhosis: Secondary | ICD-10-CM | POA: Diagnosis not present

## 2021-06-08 DIAGNOSIS — K7469 Other cirrhosis of liver: Secondary | ICD-10-CM | POA: Diagnosis not present

## 2021-06-13 ENCOUNTER — Other Ambulatory Visit: Payer: Self-pay | Admitting: Nurse Practitioner

## 2021-06-13 DIAGNOSIS — K7469 Other cirrhosis of liver: Secondary | ICD-10-CM | POA: Diagnosis not present

## 2021-06-13 DIAGNOSIS — K743 Primary biliary cirrhosis: Secondary | ICD-10-CM | POA: Diagnosis not present

## 2021-06-13 DIAGNOSIS — K754 Autoimmune hepatitis: Secondary | ICD-10-CM | POA: Diagnosis not present

## 2021-06-13 DIAGNOSIS — K5900 Constipation, unspecified: Secondary | ICD-10-CM | POA: Diagnosis not present

## 2021-07-03 ENCOUNTER — Ambulatory Visit: Payer: Medicare Other | Admitting: Gastroenterology

## 2021-07-05 ENCOUNTER — Other Ambulatory Visit: Payer: Medicare Other

## 2021-07-16 ENCOUNTER — Other Ambulatory Visit (INDEPENDENT_AMBULATORY_CARE_PROVIDER_SITE_OTHER): Payer: Medicare Other

## 2021-07-16 ENCOUNTER — Other Ambulatory Visit: Payer: Self-pay

## 2021-07-16 ENCOUNTER — Encounter: Payer: Self-pay | Admitting: Neurology

## 2021-07-16 ENCOUNTER — Ambulatory Visit (INDEPENDENT_AMBULATORY_CARE_PROVIDER_SITE_OTHER): Payer: Commercial Managed Care - HMO | Admitting: Neurology

## 2021-07-16 VITALS — BP 132/84 | HR 68 | Ht 63.0 in | Wt 144.4 lb

## 2021-07-16 DIAGNOSIS — G43109 Migraine with aura, not intractable, without status migrainosus: Secondary | ICD-10-CM

## 2021-07-16 DIAGNOSIS — Z23 Encounter for immunization: Secondary | ICD-10-CM

## 2021-07-16 NOTE — Progress Notes (Signed)
NEUROLOGY FOLLOW UP OFFICE NOTE  MISTIE ADNEY [id number] [birth year]  HISTORY OF PRESENT ILLNESS: I had the pleasure of seeing Jesyca Weisenburger in follow-up in the neurology clinic on 07/16/2021.  The patient was last seen 7 months ago for memory loss. She is alone in the office today. Records and images were personally reviewed where available.  Since her last visit, she underwent repeat Neuropsychological evaluation in 11/2020 with isolated deficits across verbal fluency and fine motor coordination/speed using her right hand. There was no significant decline from 2021 testing, and performances were a whole less variable, with improvements across several tasks. Most likely cause for subjective cognitive dysfunction continues to surround underlying traits for ADHD, exacerbated by significant psychiatric distress and disrupted sleep. She underwent endovascular treatment of large complex right MCA aneurysm in 01/2021 with good results, significant improvement in her headaches for 3 months after the procedure. She states she "never felt better in my life." However, in November and December, headaches recurred, she was having them so bad she had to cancel appointments. Headaches are better since then, however today she has a "killer" headache with pain over the right eye, right parietal region, and back of her head. She has visual auras and reports flashing lights just in the right eye, and blurred vision. She also got new reading glasses and feels a lot of the headaches are due to them, she would be wearing her glasses then start having a headache. She takes them off, takes Zomig and coffee, and feels better. Excedrin migraine upsets her stomach, if she has no Zomig, she takes 3 Ibuprofen with coffee. We discussed Neuropsych evaluation results, she has not heard back from [facility]. She lives alone and does not drive.    History on Initial Assessment 09/08/2018: This is a pleasant 66 year old  right-handed woman with a history of hyperlipidemia, tobacco use, anxiety, migraine with aura, right MCA aneurysm s/p craniotomy, presenting for evaluation of worsening memory. She started having memory issues after her brain surgery in 2002, however she continued to function quite well and states she has always been on top of things until around October 2019. At that time, she started noticing word-finding difficulties, [name] has noticed this as well, she would stop mid-sentence unable to remember a word. She would hear something but would not comprehend what people are saying, asking them to repeat it several times. She feels like her brain is "short-circuiting." She reports her long-term memory is "awesome" but she cannot remember what she ate yesterday. She has gotten lost driving and uses her GPS all the time even for familiar roads. 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 There were 3 days last week where she was driving to work then she suddenly realizes she is there but does not remember the trip. [name] was talking to her one time while patient was driving then realized she was at work, and did not notice any confusion. 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 She states numbers have always been her thing but now she cannot do math anymore. She lives with 2 roommates who were the first ones to notice how forgetful she has been. She has been told she is "off somewhere" or "zoning out" and  would need to be called a couple of times to get her attention. She has noticed she gets frustrated and agitated more easily. She was anxious after her brain surgery in 2002 and was started on Paxil which helped, she does not feel it is helping much now.  She has also started having involuntary left  hand shaking lasting a few minutes. Leg is unaffected. There is no associated speech difficulty, confusion, or weakness when she has the shaking. She has numbness and tingling in her finger tips. She has been having more headaches since October, with good response to Excedrin. She has dizziness when she gets up from bed in the morning or in the middle of the night, this is when she usually falls. Last fall was a week ago. She has noticed that if she looks at something sometimes, she sees something different, then looks again and sees what the object actually is. No olfactory/gustatory hallucinations. She has nausea when dizzy, but also has waves of nausea throughout the day. Sleep is fine most of the time. She has not woken up with tongue bite or incontinence. Her mother had memory issues after several strokes. Her father had a history of cerebral aneurysm as well. She had a head injury in the 1980s. No alcohol use. There is no family history of seizures.   MRI brain without contrast in January 2017 showed encephalomalacia of the right temporal lobe, right lateral lenticulostriate territory chronic infarct affecting the external capsule, periventricular white matter, patulous right temporal horn. Cerebellar tonsils are 56mm below foramen magnum. MRA head showed combination fusiform and saccular aneurysm of the right MCA throughout M1 segment, appearing similar in size to angiogram in 2002.   Repeat MRI/MRA brain in 05/2019 showed no acute changes. There were post-op changes from right pterional craniotomy with underlying chronic post-operative and/or ischemic encephalomalacia in the right temporal lobe and right basal ganglia. There is a Chiari 1 malfomation with tonsils extending up to 86mm through foramen magnum, stable from prior. There is diffuse atrophy and chronic microvascular disease, mildly progressed from 2017. MRA showed combination of fusiform and saccular right MCA aneurysm originating from the  right M1 segment, similar to 2017 imaging.   Neurocognitive testing was done in March 2021. There was performance variability as well as concerns surrounding validity of lower test scores. Deficits in visuoperceptual functioning are consistent with history of right temporal lobe encephalomalacia. There was no pronounced right frontal cognitive dysfunction seen. There was variability across processing speed, executive functioning, and visuospatial abilities, as well as learning and memory. She scored moderate range for acute symptoms of anxiety and depression. She also scored in the "likely to have ADHD" range across a childhood assessment of inattentive symptoms. It was noted that "There is the potential that the primary etiology of her cognitive deficits surrounds underlying traits of ADHD, exacerbated by significant psychiatric distress. This could manifest in trouble with processing speed and receptive language, worsening her ability to problem solve, adapt to changing cognitive demands, and learn novel information. However, I cannot rule out an underlying cognitive disorder at the present time."   EEG done 06/2019 showed occasional slowing over the right frontal and temporal regions.   Prior headache preventative medication: gabapentin Prior headache rescue medication: imitrex, zomig, Maxalt (needed 2 doses, drowsiness)  PAST MEDICAL HISTORY: Past Medical History:  Diagnosis Date   Anemia    Arnold-Chiari malformation, type I    Atherosclerosis of aorta 10/02/2017   Attention and concentration deficit 09/02/2019  traits of ADHD at very least   Autoimmune hepatitis 08/04/2018   Patient with PBC and autoimmune hepatitis overlap on previous biopsy.   Patient has a negative AMA, ANA was positive in a dual nuclear and speckled pattern with a titer of 1:1280, ASMA was weakly positive at 27, and a mildly elevated IgG. Biopsy reported mild to moderate interface hepatitis.  She is currently on  azathioprine 100 mg dail   Cerebral aneurysm without rupture    Combination of fusiform and saccular right MCA aneurysm   Complication of anesthesia    hard to wake up after brain surgery in 2002   Diverticulosis 07/21/2020   Dysthymia 04/03/2012   Generalized anxiety disorder    History of colonic polyps 08/12/2016   Hyperlipidemia 08/12/2016   LDL goal <100   Incomplete right bundle branch block 09/22/2017   Major depressive disorder    Migraine headache    Primary biliary cholangitis 08/04/2018   Primary biliary cirrhosis    Smoker     MEDICATIONS: Current Outpatient Medications on File Prior to Visit  Medication Sig Dispense Refill   aspirin EC 81 MG tablet Take 81 mg by mouth daily. Swallow whole.     atorvastatin (LIPITOR) 40 MG tablet Take 1 tablet (40 mg total) by mouth daily. (Patient taking differently: Take 40 mg by mouth at bedtime.) 90 tablet 3   azaTHIOprine (IMURAN) 50 MG tablet Take 100 mg by mouth at bedtime.     clopidogrel (PLAVIX) 75 MG tablet Take 1 tablet (75 mg total) by mouth daily. 30 tablet 2   ibuprofen (ADVIL) 200 MG tablet Take 400 mg by mouth every 6 (six) hours as needed for headache.     Multiple Vitamin (MULTIVITAMIN WITH MINERALS) TABS Take 1 tablet by mouth at bedtime.     OCALIVA 5 MG TABS Take 5 mg by mouth at bedtime.     PARoxetine (PAXIL) 20 MG tablet Take 1 tablet (20 mg total) by mouth daily. 90 tablet 1   zolmitriptan (ZOMIG) 5 MG tablet Take 1 tablet (5 mg total) by mouth as needed for migraine. 10 tablet 11   No current facility-administered medications on file prior to visit.    ALLERGIES: Allergies  Allergen Reactions   Codeine Nausea And Vomiting    FAMILY HISTORY: Family History  Problem Relation Age of Onset   Stroke Mother    Hypertension Mother    Prostate cancer Father        mets   Aneurysm Father    Breast cancer Maternal Grandmother     SOCIAL HISTORY: Social History   Socioeconomic History   Marital  status: Divorced    Spouse name: Not on file   Number of children: 2   Years of education: 12   Highest education level: High school graduate  Occupational History   Occupation: Unemployed    Comment: Prior customer service  Tobacco Use   Smoking status: Every Day    Packs/day: 0.50    Types: Cigarettes   Smokeless tobacco: Never  Vaping Use   Vaping Use: Never used  Substance and Sexual Activity   Alcohol use: No   Drug use: Not Currently    Types: Marijuana    Comment: several times per week to help with anxiety   Sexual activity: Not Currently  Other Topics Concern   Not on file  Social History Narrative   Right handed   Highest level of edu- HS   Lives in one story home  with two roommates   Social Determinants of Health   Financial Resource Strain: Not on file  Food Insecurity: Not on file  Transportation Needs: Not on file  Physical Activity: Not on file  Stress: Not on file  Social Connections: Not on file  Intimate Partner Violence: Not on file     PHYSICAL EXAM: Vitals:   07/16/21 1411  BP: 132/84  Pulse: 68  SpO2: 99%   General: No acute distress, anxious bouncing her legs during the visit Head:  Normocephalic/atraumatic Skin/Extremities: No rash, no edema Neurological Exam: alert and awake. No aphasia or dysarthria. Fund of knowledge is appropriate.  Attention and concentration are normal.   Cranial nerves: Pupils equal, round. Extraocular movements intact with no nystagmus. Visual fields full on right, reports blurred vision on right eye, unable to count fingers.  No facial asymmetry.  Motor: Bulk and tone normal, muscle strength 5/5 throughout with no pronator drift.   Finger to nose testing intact.  Gait narrow-based and steady, no ataxia   Has not heard back yet from Dr. Estanislado Pandy for repeat f/u or imaging Declines migraine cocktail, asking for zomig Declines preeventative, would like to wait to see Dr. Keturah Barre first Anxious leg  shaking  IMPRESSION: This is a 67 yo RH woman with a history of  hyperlipidemia, tobacco use, anxiety, migraine with aura, right MCA aneurysm s/p craniotomy and recent endovascular treatment in 01/2021, who presented with memory changes and headaches. Repeat Neuropsychological evaluation did not show any evidence of a neurodegenerative process, subjective memory complaints likely due to underlying traits for ADHD, exacerbated by significant psychiatric distress and disrupted sleep. Follow-up with Behavioral Health. She initially reported a significant improvement in migraines after endovascular treatment of aneurysm, however headaches recurred the past 3 months. Discussed starting a different migraine preventative medication, she would like to see Dr. Estanislado Pandy first and do repeat imaging planned. Continue prn Zomig. Continue with plans for smoking cessation. Follow-up in 8 months, call for any changes.   Thank you for allowing me to participate in her care.  Please do not hesitate to call for any questions or concerns.    Ellouise Newer, M.D.   CC: Dr. Redmond School, Dr. Estanislado Pandy

## 2021-07-16 NOTE — Patient Instructions (Signed)
Good to see you.  Refills sent for Zomig.   2. Continue trying to contact Dr. Arlean Hopping office for follow-up. We can start a different daily headache preventative medication when you are ready  3. Continue trying to contact Behavioral Health  4. Follow-up in 8 months, call for any changes

## 2021-07-18 ENCOUNTER — Telehealth (HOSPITAL_COMMUNITY): Payer: Self-pay | Admitting: Radiology

## 2021-07-18 ENCOUNTER — Telehealth: Payer: Self-pay | Admitting: Family Medicine

## 2021-07-18 ENCOUNTER — Other Ambulatory Visit: Payer: Self-pay | Admitting: Student

## 2021-07-18 DIAGNOSIS — F411 Generalized anxiety disorder: Secondary | ICD-10-CM

## 2021-07-18 MED ORDER — CLOPIDOGREL BISULFATE 75 MG PO TABS: 75.0000 mg | ORAL_TABLET | Freq: Every day | ORAL | 3 refills | Status: DC

## 2021-07-18 MED ORDER — PAROXETINE HCL 20 MG PO TABS: 20.0000 mg | ORAL_TABLET | Freq: Every day | ORAL | 1 refills | Status: DC

## 2021-07-18 NOTE — Telephone Encounter (Signed)
Pt called for refills of Paxil. Please send to [facility]. Pt can be reached at [phone].

## 2021-07-18 NOTE — Telephone Encounter (Signed)
Called pt back, she wants to schedule her f/u MRI/MRA. We are awaiting insurance authorization. We will call her to schedule when this has been approved. Patient agrees with this plan of care. JM

## 2021-08-01 ENCOUNTER — Ambulatory Visit
Admission: RE | Admit: 2021-08-01 | Discharge: 2021-08-01 | Disposition: A | Discharge status: Home / Self Care | Payer: Medicare Other | Source: Ambulatory Visit | Attending: Nurse Practitioner | Admitting: Nurse Practitioner

## 2021-08-01 DIAGNOSIS — K7469 Other cirrhosis of liver: Secondary | ICD-10-CM

## 2021-08-01 DIAGNOSIS — K7460 Unspecified cirrhosis of liver: Secondary | ICD-10-CM | POA: Diagnosis not present

## 2021-08-07 ENCOUNTER — Other Ambulatory Visit (HOSPITAL_COMMUNITY): Payer: Self-pay | Admitting: Interventional Radiology

## 2021-08-07 DIAGNOSIS — I671 Cerebral aneurysm, nonruptured: Secondary | ICD-10-CM

## 2021-08-13 ENCOUNTER — Telehealth: Payer: Self-pay | Admitting: Family Medicine

## 2021-08-13 ENCOUNTER — Other Ambulatory Visit: Payer: Self-pay | Admitting: Family Medicine

## 2021-08-13 NOTE — Telephone Encounter (Signed)
Pt called and made a medcheck appt for April 24.2023. She will need refills on atorvastatin sent to [facility] on [address]. Pt is completely out.

## 2021-09-12 ENCOUNTER — Telehealth (HOSPITAL_COMMUNITY): Payer: Self-pay

## 2021-09-12 NOTE — Telephone Encounter (Signed)
Called to schedule mri/mra, no answer, left vm. AW  

## 2021-10-01 ENCOUNTER — Ambulatory Visit (HOSPITAL_COMMUNITY)
Admission: RE | Admit: 2021-10-01 | Discharge: 2021-10-01 | Disposition: A | Discharge status: Home / Self Care | Payer: Medicare Other | Source: Ambulatory Visit | Attending: Interventional Radiology | Admitting: Interventional Radiology

## 2021-10-01 DIAGNOSIS — I671 Cerebral aneurysm, nonruptured: Secondary | ICD-10-CM

## 2021-10-15 ENCOUNTER — Encounter: Payer: Self-pay | Admitting: Family Medicine

## 2021-10-15 ENCOUNTER — Ambulatory Visit (INDEPENDENT_AMBULATORY_CARE_PROVIDER_SITE_OTHER): Payer: Medicare Other | Admitting: Family Medicine

## 2021-10-15 VITALS — BP 116/72 | HR 82 | Temp 98.1°F | Wt 148.4 lb

## 2021-10-15 DIAGNOSIS — G935 Compression of brain: Secondary | ICD-10-CM | POA: Diagnosis not present

## 2021-10-15 DIAGNOSIS — I671 Cerebral aneurysm, nonruptured: Secondary | ICD-10-CM | POA: Diagnosis not present

## 2021-10-15 DIAGNOSIS — G43109 Migraine with aura, not intractable, without status migrainosus: Secondary | ICD-10-CM | POA: Diagnosis not present

## 2021-10-15 DIAGNOSIS — K743 Primary biliary cirrhosis: Secondary | ICD-10-CM

## 2021-10-15 DIAGNOSIS — F17200 Nicotine dependence, unspecified, uncomplicated: Secondary | ICD-10-CM

## 2021-10-15 DIAGNOSIS — I700 Atherosclerosis of aorta: Secondary | ICD-10-CM

## 2021-10-15 DIAGNOSIS — E785 Hyperlipidemia, unspecified: Secondary | ICD-10-CM | POA: Diagnosis not present

## 2021-10-15 DIAGNOSIS — K754 Autoimmune hepatitis: Secondary | ICD-10-CM | POA: Diagnosis not present

## 2021-10-15 DIAGNOSIS — F411 Generalized anxiety disorder: Secondary | ICD-10-CM

## 2021-10-15 DIAGNOSIS — E2839 Other primary ovarian failure: Secondary | ICD-10-CM

## 2021-10-15 MED ORDER — PAROXETINE HCL 20 MG PO TABS: 20.0000 mg | ORAL_TABLET | Freq: Every day | ORAL | 1 refills | Status: DC

## 2021-10-15 MED ORDER — ZOLMITRIPTAN 5 MG PO TABS: 5.0000 mg | ORAL_TABLET | ORAL | 11 refills | Status: DC | PRN

## 2021-10-15 MED ORDER — ATORVASTATIN CALCIUM 40 MG PO TABS: 40.0000 mg | ORAL_TABLET | Freq: Every day | ORAL | 3 refills | Status: DC

## 2021-10-15 MED ORDER — CLOPIDOGREL BISULFATE 75 MG PO TABS: 75.0000 mg | ORAL_TABLET | Freq: Every day | ORAL | 3 refills | Status: DC

## 2021-10-15 NOTE — Progress Notes (Signed)
? ?  Subjective:  ? ? Patient ID: Elizabeth Phelps, female    DOB: 21-Jul-1955, 66 y.o.   MRN: [id number] ? ?HPI ?She has a history of primary biliary cholangitis as well as autoimmune hepatitis and is being followed by specialists for that.  She also has a cerebral aneurysm that has been stented and is seen by Dr. Bennie Dallas for that.  She also has a history of Arnold-Chiari malformation.  She continues on Plavix for that.  She smokes but is slowly cutting back.  She does have migraine headaches, getting 3 to 4-year and they are have an aura.  She does use Zomig for control of this.  Her anxiety seems to be under good control on the Paxil.  She also has a history of aortic atherosclerosis.  Health maintenance and immunizations was reviewed.  Her daughter is with her today. ? ? ?Review of Systems ? ?   ?Objective:  ? Physical Exam ?Alert and in no distress. Tympanic membranes and canals are normal. Pharyngeal area is normal. Neck is supple without adenopathy or thyromegaly. Cardiac exam shows a regular sinus rhythm without murmurs or gallops. Lungs are clear to auscultation. ? ? ? ? ?   ?Assessment & Plan:  ?Primary biliary cholangitis - Plan: CBC with Differential/Platelet, Comprehensive metabolic panel ? ?Atherosclerosis of aorta - Plan: atorvastatin (LIPITOR) 40 MG tablet, CBC with Differential/Platelet, Lipid panel ? ?Autoimmune hepatitis - Plan: CBC with Differential/Platelet, Comprehensive metabolic panel ? ?Cerebral aneurysm without rupture - Plan: clopidogrel (PLAVIX) 75 MG tablet ? ?Current smoker ? ?Migraine with aura and without status migrainosus, not intractable - Plan: zolmitriptan (ZOMIG) 5 MG tablet ? ?Arnold-Chiari malformation, type I ? ?Hyperlipidemia LDL goal <100 ? ?Generalized anxiety disorder - Plan: PARoxetine (PAXIL) 20 MG tablet ? ?Estrogen deficiency - Plan: DG Bone Density, CBC with Differential/Platelet, Comprehensive metabolic panel ?She does need various immunizations but at this point it is  wanting to hold off on it because she has an impending trip.  She will return here for her immunizations after her trip.  Encouraged her to continue work on his smoking cessation.  Recommend she increase the ibuprofen that she has an aura to 800 mg that she was on 600 before. ? ?

## 2021-10-15 NOTE — Patient Instructions (Signed)
.    With your next aura  try 800 mg of ibuprofen immediately ?

## 2021-10-17 LAB — CBC WITH DIFFERENTIAL/PLATELET
Basophils Absolute: 0.1 10*3/uL (ref 0.0–0.2)
Basos: 1 %
EOS (ABSOLUTE): 0.1 10*3/uL (ref 0.0–0.4)
Eos: 2 %
Hematocrit: 42.9 % (ref 34.0–46.6)
Hemoglobin: 14.2 g/dL (ref 11.1–15.9)
Immature Grans (Abs): 0 10*3/uL (ref 0.0–0.1)
Immature Granulocytes: 1 %
Lymphocytes Absolute: 0.9 10*3/uL (ref 0.7–3.1)
Lymphs: 15 %
MCH: 29 pg (ref 26.6–33.0)
MCHC: 33.1 g/dL (ref 31.5–35.7)
MCV: 88 fL (ref 79–97)
Monocytes Absolute: 0.6 10*3/uL (ref 0.1–0.9)
Monocytes: 10 %
Neutrophils Absolute: 4.2 10*3/uL (ref 1.4–7.0)
Neutrophils: 71 %
Platelets: 171 10*3/uL (ref 150–450)
RBC: 4.89 x10E6/uL (ref 3.77–5.28)
RDW: 12.9 % (ref 11.7–15.4)
WBC: 5.9 10*3/uL (ref 3.4–10.8)

## 2021-10-17 LAB — LIPID PANEL
Chol/HDL Ratio: 3.7 ratio (ref 0.0–4.4)
Cholesterol, Total: 167 mg/dL (ref 100–199)
HDL: 45 mg/dL (ref >39)
LDL Chol Calc (NIH): 89 mg/dL (ref 0–99)
Triglycerides: 193 mg/dL — ABNORMAL HIGH (ref 0–149)
VLDL Cholesterol Cal: 33 mg/dL (ref 5–40)

## 2021-10-17 LAB — COMPREHENSIVE METABOLIC PANEL
ALT: 15 IU/L (ref 0–32)
AST: 29 IU/L (ref 0–40)
Albumin/Globulin Ratio: 1.3 (ref 1.2–2.2)
Albumin: 4.1 g/dL (ref 3.8–4.8)
Alkaline Phosphatase: 86 IU/L (ref 44–121)
BUN/Creatinine Ratio: 14 (ref 12–28)
BUN: 11 mg/dL (ref 8–27)
Bilirubin Total: 0.5 mg/dL (ref 0.0–1.2)
CO2: 22 mmol/L (ref 20–29)
Calcium: 9.2 mg/dL (ref 8.7–10.3)
Chloride: 105 mmol/L (ref 96–106)
Creatinine, Ser: 0.77 mg/dL (ref 0.57–1.00)
Globulin, Total: 3.1 g/dL (ref 1.5–4.5)
Glucose: 94 mg/dL (ref 70–99)
Potassium: 5 mmol/L (ref 3.5–5.2)
Sodium: 140 mmol/L (ref 134–144)
Total Protein: 7.2 g/dL (ref 6.0–8.5)
eGFR: 86 mL/min/{1.73_m2} (ref >59)

## 2021-10-20 ENCOUNTER — Telehealth: Payer: Self-pay

## 2021-10-20 NOTE — Telephone Encounter (Signed)
P.A. ZOLMITRIPTAN

## 2021-10-24 ENCOUNTER — Ambulatory Visit (INDEPENDENT_AMBULATORY_CARE_PROVIDER_SITE_OTHER): Payer: Medicare Other | Admitting: Psychology

## 2021-10-24 DIAGNOSIS — F411 Generalized anxiety disorder: Secondary | ICD-10-CM | POA: Diagnosis not present

## 2021-10-24 DIAGNOSIS — F331 Major depressive disorder, recurrent, moderate: Secondary | ICD-10-CM | POA: Diagnosis not present

## 2021-10-24 NOTE — Progress Notes (Addendum)
Comprehensive Clinical Assessment (CCA) Note ? ?10/24/2021 ?[name] ?[id number] ? ?Time Spent: 1:31  pm - 2:21 pm: 50 Minutes ? ?Chief Complaint: No chief complaint on file. ? ?Visit Diagnosis: MDD, GAD.   ? ?Guardian/Payee: self   ? ?Paperwork requested: Yes , psychological assessment from [facility] - Dr. Melvyn Novas ? ?Reason for Visit /Presenting Problem: Anxiety ? ?Mental Status Exam: ?Appearance:   Neat and Well Groomed     ?Behavior:  Appropriate  ?Motor:  Restlestness  ?Speech/Language:   Pressured  ?Affect:  Appropriate  ?Mood:  normal  ?Thought process:  normal  ?Thought content:    WNL  ?Sensory/Perceptual disturbances:    WNL  ?Orientation:  oriented to person, place, time/date, and situation  ?Attention:  Fair  ?Concentration:  Fair  ?Memory:  WNL  ?Fund of knowledge:   Good  ?Insight:    Good  ?Judgment:   Good  ?Impulse Control:  Fair  ? ?Reported Symptoms:  Anxiety & depression.  ? ?Risk Assessment: ?Danger to Self:  No ?Self-injurious Behavior: No ?Danger to Others: No ?Duty to Warn:no ?Physical Aggression / Violence:No  ?Access to Firearms a concern: No  ?Gang Involvement:No  ?Patient / guardian was educated about steps to take if suicide or homicide risk level increases between visits: n/a ?While future psychiatric events cannot be accurately predicted, the patient does not currently require acute inpatient psychiatric care and does not currently meet [facility] involuntary commitment criteria. ? ?Substance Abuse History: ?Current substance abuse: No    ?Caffeine use: 3x cups per day.  ?Tobacco: 1/2 pack per day.  ?Alcohol: Denied. Has non-alcoholic sorosis  ?Substance Use: marijuana Fluctuating usage depending on anxiety. Uses to manage anxiety.   ? ?Past Psychiatric History:   ?Previous psychological history is significant for anorexia and depression ?Outpatient Providers: History of of therapy with Marguerite Olea (female therapist). She has a history of treatment with [facility].  ?History of Psych Hospitalization: No  ?Psychological Testing: Memory:  testing via social security disability (pending)  Dr. Janelle Floor Neurology in 2022. (Records will be requested) ? ?Abuse History:  ?Victim of: No.,  denied.     ?Report needed: No. ?Victim of Neglect:No. ?Perpetrator of  n/a   ?Witness / Exposure to Domestic Violence: No   ?Protective Services Involvement: No  ?Witness to [employer] Violence:  No  ? ?Family History:  ?Family History  ?Problem Relation Age of Onset  ? Stroke Mother   ? Hypertension Mother   ? Prostate cancer Father   ?     mets  ? Aneurysm Father   ? Breast cancer Maternal Grandmother   ? ? ?Living situation: the patient lives alone ? ?Sexual Orientation: Straight ? ?Relationship Status: divorced  ?Name of spouse / other: Tim.  ?If a parent, number of children / ages:Jeremy and Broadus John (sons) and 6 grandchildren.  ? ?Support Systems: family lives near by.  ? ?Financial Stress:  Yes . Currently drawing social security and is pursuing disability.  ? ?Income/Employment/Disability: Social Security Retirement ? ?Military Service: No  ? ?Educational History: ?Education: some college ? ?Religion/Sprituality/World View: ?Christian ? ?Any cultural differences that may affect / interfere with treatment:  not applicable  ? ?Recreation/Hobbies: Time with friends, chatting, soap operas, time with family and grandchildren, meals and shopping.  ? ?Stressors: Financial difficulties   ?Health problems   ?Marital or family conflict   ? ?Strengths: Supportive Relationships, Family, Church, Spirituality, Conservator, museum/gallery, and Able to Communicate Effectively ? ?Barriers:  Finances, anxiety, mood, inattention.   ? ?Legal History: ?Pending legal issue / charges: The patient has no significant history of legal issues. ?History of legal issue / charges:  n/a ? ?Medical History/Surgical History: reviewed ?Past Medical History:  ?Diagnosis Date  ? Anemia   ? Arnold-Chiari malformation, type I   ?  Atherosclerosis of aorta 10/02/2017  ? Attention and concentration deficit 09/02/2019  ? traits of ADHD at very least  ? Autoimmune hepatitis 08/04/2018  ? Patient with PBC and autoimmune hepatitis overlap on previous biopsy.   Patient has a negative AMA, ANA was positive in a dual nuclear and speckled pattern with a titer of 1:1280, ASMA was weakly positive at 27, and a mildly elevated IgG. Biopsy reported mild to moderate interface hepatitis.  She is currently on azathioprine 100 mg dail  ? Cerebral aneurysm without rupture   ? Combination of fusiform and saccular right MCA aneurysm  ? Complication of anesthesia   ? hard to wake up after brain surgery in 2002  ? Diverticulosis 07/21/2020  ? Dysthymia 04/03/2012  ? Generalized anxiety disorder   ? History of colonic polyps 08/12/2016  ? Hyperlipidemia 08/12/2016  ? LDL goal <100  ? Incomplete right bundle branch block 09/22/2017  ? Major depressive disorder   ? Migraine headache   ? Primary biliary cholangitis 08/04/2018  ? Primary biliary cirrhosis   ? Smoker   ? ? ?Past Surgical History:  ?Procedure Laterality Date  ? APPENDECTOMY    ? BRAIN SURGERY    ? BREAST EXCISIONAL BIOPSY Left   ? CATARACT EXTRACTION Right 2018  ? COLONOSCOPY  2007  ? Dr. Collene Mares  ? IR ANGIO INTRA EXTRACRAN SEL COM CAROTID INNOMINATE BILAT MOD SED  01/04/2021  ? IR ANGIO INTRA EXTRACRAN SEL INTERNAL CAROTID UNI R MOD SED  01/24/2021  ? IR ANGIO VERTEBRAL SEL SUBCLAVIAN INNOMINATE UNI L MOD SED  01/04/2021  ? IR ANGIO VERTEBRAL SEL VERTEBRAL UNI R MOD SED  01/04/2021  ? IR CT HEAD LTD  01/24/2021  ? IR RADIOLOGIST EVAL & MGMT  12/22/2020  ? IR RADIOLOGIST EVAL & MGMT  01/10/2021  ? IR RADIOLOGIST EVAL & MGMT  02/08/2021  ? IR TRANSCATH/EMBOLIZ  01/24/2021  ? IR US GUIDE VASC ACCESS RIGHT  01/04/2021  ? IR US GUIDE VASC ACCESS RIGHT  01/24/2021  ? KNEE ARTHROSCOPY Right   ? RADIOLOGY WITH ANESTHESIA N/A 01/24/2021  ? Procedure: RADIOLOGY WITH ANESTHESIA EMBOLIZATION;  Surgeon: Luanne Bras, MD;  Location:  [place];  Service: Radiology;  Laterality: N/A;  ? TONSILLECTOMY AND ADENOIDECTOMY    ? age 66  ? ? ?Medications: ?Current Outpatient Medications  ?Medication Sig Dispense Refill  ? aspirin EC 81 MG tablet Take 81 mg by mouth daily. Swallow whole.    ? atorvastatin (LIPITOR) 40 MG tablet Take 1 tablet (40 mg total) by mouth daily. 90 tablet 3  ? azaTHIOprine (IMURAN) 50 MG tablet Take 100 mg by mouth at bedtime.    ? clopidogrel (PLAVIX) 75 MG tablet Take 1 tablet (75 mg total) by mouth daily. 30 tablet 3  ? ibuprofen (ADVIL) 200 MG tablet Take 400 mg by mouth every 6 (six) hours as needed for headache.    ? Multiple Vitamin (MULTIVITAMIN WITH MINERALS) TABS Take 1 tablet by mouth at bedtime.    ? OCALIVA 5 MG TABS Take 5 mg by mouth at bedtime.    ? PARoxetine (PAXIL) 20 MG tablet Take 1 tablet (20 mg total) by mouth  daily. 90 tablet 1  ? zolmitriptan (ZOMIG) 5 MG tablet Take 1 tablet (5 mg total) by mouth as needed for migraine. 10 tablet 11  ? ?No current facility-administered medications for this visit.  ? ? ?Allergies  ?Allergen Reactions  ? Codeine Nausea And Vomiting  ? ? ?Diagnoses:  ?Generalized anxiety disorder ? ?Moderate episode of recurrent major depressive disorder ([place]) ? ?Cannabis use disorder, mild, abuse ? ?Plan of Care: Outpatient Therapy, psychological assessment (ADHD), and psychiatric evaluation.  ? ?Narrative:  ? ?[name] participated from home, via video, and consented to treatment. Therapist participated from home office. We met online due to [place] pandemic.  She is and was self-referred for counseling due to symptoms of anxiety.  She has a history of depression, anxiety, and had a recent psychological examination, in 2022, which highlighted the possibility of ADHD symptoms.  She noted that her "main problem is anxiety" but discussed not wanting to be medicated to handle it.  She noted "I want to learn how to handle it myself".  She does take paroxetine 20 mg daily.  And noted being  adherent to the medication regimen.  She was open to a different medication should aid in managing her symptoms while limiting any possible side effects related to.  She has a history of brain surgery and

## 2021-11-05 ENCOUNTER — Ambulatory Visit (INDEPENDENT_AMBULATORY_CARE_PROVIDER_SITE_OTHER): Payer: Medicare Other | Admitting: Psychology

## 2021-11-05 DIAGNOSIS — F331 Major depressive disorder, recurrent, moderate: Secondary | ICD-10-CM

## 2021-11-05 DIAGNOSIS — F411 Generalized anxiety disorder: Secondary | ICD-10-CM

## 2021-11-05 NOTE — Progress Notes (Signed)
[facility] Counselor/Therapist Progress Note ? ?Patient ID: Elizabeth Phelps, MRN: [id number]  ? ?Date: 11/05/21 ? ?Time Spent: 9:08  am - 9:55 am : 47 Minutes ? ?Treatment Type: Individual Therapy. ? ?Reported Symptoms: Anxiety, depression, and inattention.  ? ?Mental Status Exam: ?Appearance:  Fairly Groomed     ?Behavior: Appropriate  ?Motor: Normal  ?Speech/Language:  Clear and Coherent  ?Affect: Flat  ?Mood: anxious  ?Thought process: normal  ?Thought content:   WNL  ?Sensory/Perceptual disturbances:   WNL  ?Orientation: oriented to person, place, time/date, and situation  ?Attention: Good  ?Concentration: Good  ?Memory: WNL  ?Fund of knowledge:  Good  ?Insight:   Good  ?Judgment:  Good  ?Impulse Control: Good  ? ?Risk Assessment: ?Danger to Self:  No ?Self-injurious Behavior: No ?Danger to Others: No ?Duty to Warn:no ?Physical Aggression / Violence:No  ?Access to Firearms a concern: No  ?Gang Involvement:No  ? ?Subjective:  ? ?Elizabeth Phelps participated from home, via video and consented to treatment. Therapist participated from office. We met online due to [place] pandemic. Elizabeth Phelps reviewed the events of the past week. We reviewed numerous treatment approaches including CBT, BA, Problem Solving, and Solution focused therapy. Psych-education regarding the Elizabeth Phelps diagnosis of Generalized anxiety disorder ? ?Moderate episode of recurrent major depressive disorder (HCC) was provided during the session. We discussed Elizabeth Phelps's goals treatment goals which include managing her overall symptoms, processing past events, processing interpersonal stressors, and engaging in enjoyable activities. Elizabeth Phelps provided verbal approval of the treatment plan.  ? ?Interventions: Psycho-education & Goal Setting.  ? ?Diagnosis:  ?Generalized anxiety disorder ? ?Moderate episode of recurrent major depressive disorder ([place]) ? ? ?Treatment Plan: ? ?Client Abilities/Strengths ?Elizabeth Phelps is intelligent,  forthcoming, and motivated for change.  ? ?Support System: ?Family and friends. ? ?Client Treatment Preferences ?OPT ? ?Client Statement of Needs ?Elizabeth Phelps would like to manage her anxiety,  learning about anxiety and depression, processing past events and relationships, increase self-care.  ? ?Treatment Level ?Weekly ? ?Symptoms ? ?Anxiety: Rumination, feeling anxious, difficulty managing worry, worrying about different things, trouble relaxing, restlessness, irritability, feeling something awful might happen.    (Status: maintained) ? ?Depression: loss of interest, feeling down, fluctuating sleep, increased appetite & weight-gain, feeling bad about self, trouble concentrating, psycho-motor, low motivation,    (Status: maintained) ? ?Goals:  ? ?Gabbrielle experiences symptoms of anxiety, depression, and inattention.  ? ? ?Target Date: 11/06/22 Frequency: Weekly  ?Progress: 0 Modality: individual  ? ? ?Therapist will provide referrals for additional resources as appropriate.  ?Therapist will provide psycho-education regarding Elizabeth Phelps diagnosis and corresponding treatment approaches and interventions. ?Licensed Clinical Social Worker, [name], LCSW will support the patient's ability to achieve the goals identified. will employ CBT, BA, Problem-solving, Solution Focused, Mindfulness,  coping skills, & other evidenced-based practices will be used to promote progress towards healthy functioning to help manage decrease symptoms associated with her diagnosis.  ? Reduce overall level, frequency, and intensity of the feelings of depression, anxiety as evidenced by decreased overall symptoms from 6 to 7 days/week to 0 to 1 days/week per client report for at least 3 consecutive months. ?Verbally express understanding of the relationship between feelings of depression, anxiety and their impact on thinking patterns and behaviors. ?Verbalize an understanding of the role that distorted thinking plays in creating fears, excessive  worry, and ruminations. ? ? ?Elizabeth Phelps participated in the creation of the treatment plan) ? ? ?[name], LCSW ? ?  ?

## 2021-11-20 NOTE — Telephone Encounter (Signed)
P.A. approved til 06/23/22, called pharmacy and went thru no charge #5 for 13 days.  Called pt and informed

## 2021-11-22 ENCOUNTER — Ambulatory Visit (INDEPENDENT_AMBULATORY_CARE_PROVIDER_SITE_OTHER): Payer: Medicare Other | Admitting: Psychology

## 2021-11-22 DIAGNOSIS — F331 Major depressive disorder, recurrent, moderate: Secondary | ICD-10-CM | POA: Diagnosis not present

## 2021-11-22 DIAGNOSIS — F411 Generalized anxiety disorder: Secondary | ICD-10-CM

## 2021-11-22 NOTE — Progress Notes (Signed)
[facility] Counselor/Therapist Progress Note  Patient ID: TAYLIA BERBER, MRN: [id number]   Date: 11/22/21  Time Spent: 10:05  am - 11:01 am : 56 Minutes  Treatment Type: Individual Therapy.  Reported Symptoms: Anxiety, depression, and inattention.   Mental Status Exam: Appearance:  Fairly Groomed     Behavior: Appropriate  Motor: Normal  Speech/Language:  Clear and Coherent  Affect: Flat  Mood: anxious  Thought process: normal  Thought content:   WNL  Sensory/Perceptual disturbances:   WNL  Orientation: oriented to person, place, time/date, and situation  Attention: Good  Concentration: Good  Memory: WNL  Fund of knowledge:  Good  Insight:   Good  Judgment:  Good  Impulse Control: Good   Risk Assessment: Danger to Self:  No Self-injurious Behavior: No Danger to Others: No Duty to Warn:no Physical Aggression / Violence:No  Access to Firearms a concern: No  Gang Involvement:No   Subjective:   [name] participated from home, via video and consented to treatment. Therapist participated from office. We met online due to [place] pandemic. Murial reviewed the events of the past week. Hana noted having a "rough week". She noted her primary trigger for her anxiety are her children. She noted frustration regarding her son's Broadus John) behavior. She noted her son making promises to his daughter and not following through on his commitments. She noted her son's lack of contact or accountability. She noted this situation resonating with her due to her own experience with her biological father terminating rights and not being involved with her. Additional triggers including her children & "the world not being a safe place". Marzelle note being very close with her grand daughter Teacher, adult education) and grand-daughter's mother. She noted her relationship with Radonna Ricker and her mother is a stressor for her son and his current girlfriend, Amy. She noted feelings of frustration and  disappointment in her son's behavior and noted this being a recurring theme. Therapist validated and normalized [place]'s feelings and experiences. Therapist encouraged Twylia to identify talking points with her son, maintaining her composure, and engaging positively and assertively. Therapist modeled this during the session.    Interventions: interpersonal.   Diagnosis:  Generalized anxiety disorder  Moderate episode of recurrent major depressive disorder [facility])   Treatment Plan:  Client Abilities/Strengths Jaunice is intelligent, forthcoming, and motivated for change.   Support System: Family and friends.  Client Treatment Preferences OPT  Client Statement of Needs Emori would like to manage her anxiety,  learning about anxiety and depression, processing past events and relationships, increase self-care.   Treatment Level Weekly  Symptoms  Anxiety: Rumination, feeling anxious, difficulty managing worry, worrying about different things, trouble relaxing, restlessness, irritability, feeling something awful might happen.    (Status: maintained)  Depression: loss of interest, feeling down, fluctuating sleep, increased appetite & weight-gain, feeling bad about self, trouble concentrating, psycho-motor, low motivation,    (Status: maintained)  Goals:   Aluna experiences symptoms of anxiety, depression, and inattention.    Target Date: 11/06/22 Frequency: Weekly  Progress: 0 Modality: individual    Therapist will provide referrals for additional resources as appropriate.  Therapist will provide psycho-education regarding Jancy's diagnosis and corresponding treatment approaches and interventions. Licensed Clinical Social Worker, [place], LCSW will support the patient's ability to achieve the goals identified. will employ CBT, BA, Problem-solving, Solution Focused, Mindfulness,  coping skills, & other evidenced-based practices will be used to promote progress towards healthy  functioning to help manage decrease symptoms associated with her diagnosis.  Reduce overall level, frequency, and intensity of the feelings of depression, anxiety as evidenced by decreased overall symptoms from 6 to 7 days/week to 0 to 1 days/week per client report for at least 3 consecutive months. Verbally express understanding of the relationship between feelings of depression, anxiety and their impact on thinking patterns and behaviors. Verbalize an understanding of the role that distorted thinking plays in creating fears, excessive worry, and ruminations.   [name] participated in the creation of the treatment plan)   [name], LCSW

## 2021-11-29 ENCOUNTER — Emergency Department (HOSPITAL_COMMUNITY): Payer: Medicare Other

## 2021-11-29 ENCOUNTER — Other Ambulatory Visit: Payer: Self-pay

## 2021-11-29 ENCOUNTER — Encounter (HOSPITAL_COMMUNITY): Payer: Self-pay

## 2021-11-29 ENCOUNTER — Emergency Department (HOSPITAL_COMMUNITY)
Admission: EM | Admit: 2021-11-29 | Discharge: 2021-11-29 | Disposition: A | Discharge status: Home / Self Care | Payer: Medicare Other | Attending: Emergency Medicine | Admitting: Emergency Medicine

## 2021-11-29 DIAGNOSIS — Z7902 Long term (current) use of antithrombotics/antiplatelets: Secondary | ICD-10-CM | POA: Insufficient documentation

## 2021-11-29 DIAGNOSIS — Z7982 Long term (current) use of aspirin: Secondary | ICD-10-CM | POA: Diagnosis not present

## 2021-11-29 DIAGNOSIS — M542 Cervicalgia: Secondary | ICD-10-CM | POA: Diagnosis not present

## 2021-11-29 DIAGNOSIS — I878 Other specified disorders of veins: Secondary | ICD-10-CM | POA: Diagnosis not present

## 2021-11-29 DIAGNOSIS — S8002XA Contusion of left knee, initial encounter: Secondary | ICD-10-CM | POA: Diagnosis not present

## 2021-11-29 DIAGNOSIS — S8992XA Unspecified injury of left lower leg, initial encounter: Secondary | ICD-10-CM | POA: Diagnosis present

## 2021-11-29 DIAGNOSIS — Y9301 Activity, walking, marching and hiking: Secondary | ICD-10-CM | POA: Insufficient documentation

## 2021-11-29 DIAGNOSIS — W19XXXA Unspecified fall, initial encounter: Secondary | ICD-10-CM

## 2021-11-29 DIAGNOSIS — Y9269 Other specified industrial and construction area as the place of occurrence of the external cause: Secondary | ICD-10-CM | POA: Diagnosis not present

## 2021-11-29 DIAGNOSIS — M25562 Pain in left knee: Secondary | ICD-10-CM | POA: Diagnosis not present

## 2021-11-29 DIAGNOSIS — R930 Abnormal findings on diagnostic imaging of skull and head, not elsewhere classified: Secondary | ICD-10-CM | POA: Diagnosis not present

## 2021-11-29 DIAGNOSIS — M25552 Pain in left hip: Secondary | ICD-10-CM | POA: Diagnosis not present

## 2021-11-29 DIAGNOSIS — W01198A Fall on same level from slipping, tripping and stumbling with subsequent striking against other object, initial encounter: Secondary | ICD-10-CM | POA: Diagnosis not present

## 2021-11-29 DIAGNOSIS — M7989 Other specified soft tissue disorders: Secondary | ICD-10-CM | POA: Diagnosis not present

## 2021-11-29 DIAGNOSIS — R519 Headache, unspecified: Secondary | ICD-10-CM | POA: Diagnosis not present

## 2021-11-29 DIAGNOSIS — I6381 Other cerebral infarction due to occlusion or stenosis of small artery: Secondary | ICD-10-CM | POA: Diagnosis not present

## 2021-11-29 MED ORDER — ACETAMINOPHEN 500 MG PO TABS
1000.0000 mg | ORAL_TABLET | Freq: Once | ORAL | Status: AC
  Administered 2021-11-29: 1000 mg via ORAL
  Filled 2021-11-29: qty 2

## 2021-11-29 MED ORDER — ACETAMINOPHEN 500 MG PO TABS: 1000.0000 mg | ORAL_TABLET | Freq: Four times a day (QID) | ORAL | 0 refills | Status: DC | PRN

## 2021-11-29 MED ORDER — CYCLOBENZAPRINE HCL 10 MG PO TABS: 10.0000 mg | ORAL_TABLET | Freq: Two times a day (BID) | ORAL | 0 refills | Status: DC | PRN

## 2021-11-29 NOTE — ED Triage Notes (Signed)
Pt c/o left knee pain after tripping and falling on the floor. Pt states she hit her head, denies LOC, denies taking blood thinners. Left knee appears swollen, pt states she is unable to bear weight on left knee.

## 2021-11-29 NOTE — Discharge Instructions (Addendum)
You have been evaluated for your fall.  Fortunately x-ray of your left knee did not show any broken bone.  Please continue to rest, ice, elevate, use compression to help support your knee and help with healing.  You may follow-up closely with your doctor for further management.  You may take over-the-counter Tylenol at home as needed for pain.  You may take Flexeril as needed for muscle spasm

## 2021-11-29 NOTE — ED Provider Notes (Signed)
[facility] DEPT Provider Note   CSN: [id number] Arrival date & time: 11/29/21  1205     History  No chief complaint on file.   Elizabeth Phelps is a 66 y.o. female.  The history is provided by the patient and medical records. No language interpreter was used.    66 year old female presenting for evaluation of left knee injury.  Patient reports she was over at her friend's house taking care of a friend that had hernia surgery.  She was walking down the hall and states she fell forward striking her left knee against the hardwood floor.  She did hit her head but denies loss of consciousness.  She is currently complaining of sharp throbbing pain about the left knee and left hip.  Pain is moderate in severity.  She was unable to ambulate afterward.  She is unsure what caused the fall.  She is on Plavix.  She denies any focal numbness of focal weakness.  Incident happened prior to arrival.  Home Medications Prior to Admission medications   Medication Sig Start Date End Date Taking? Authorizing Provider  aspirin EC 81 MG tablet Take 81 mg by mouth daily. Swallow whole.    [provider]  atorvastatin (LIPITOR) 40 MG tablet Take 1 tablet (40 mg total) by mouth daily. 10/15/21   Denita Lung, MD  azaTHIOprine (IMURAN) 50 MG tablet Take 100 mg by mouth at bedtime. 02/04/20   [provider]  clopidogrel (PLAVIX) 75 MG tablet Take 1 tablet (75 mg total) by mouth daily. 10/15/21   Denita Lung, MD  ibuprofen (ADVIL) 200 MG tablet Take 400 mg by mouth every 6 (six) hours as needed for headache.    [provider]  Multiple Vitamin (MULTIVITAMIN WITH MINERALS) TABS Take 1 tablet by mouth at bedtime.    [provider]  OCALIVA 5 MG TABS Take 5 mg by mouth at bedtime. 08/19/18   [provider]  PARoxetine (PAXIL) 20 MG tablet Take 1 tablet (20 mg total) by mouth daily. 10/15/21   Denita Lung, MD  zolmitriptan (ZOMIG) 5  MG tablet Take 1 tablet (5 mg total) by mouth as needed for migraine. 10/15/21   Denita Lung, MD      Allergies    Codeine    Review of Systems   Review of Systems  All other systems reviewed and are negative.   Physical Exam Updated Vital Signs BP 123/77   Pulse 87   Temp 98.1 F (36.7 C) (Oral)   Resp 18   SpO2 100%  Physical Exam Vitals and nursing note reviewed.  Constitutional:      General: She is not in acute distress.    Appearance: She is well-developed.  HENT:     Head: Normocephalic and atraumatic.     Comments: No facial tenderness no scalp tenderness Eyes:     Conjunctiva/sclera: Conjunctivae normal.  Cardiovascular:     Rate and Rhythm: Normal rate and regular rhythm.  Pulmonary:     Effort: Pulmonary effort is normal.  Musculoskeletal:        General: Tenderness (Left knee: Effusion noted about the knee with exquisite tenderness to palpation decreased range of motion patella is located.) present.     Cervical back: Normal range of motion and neck supple. No tenderness.     Comments: Left hip: Tenderness to posterior left hip with out bruising or deformity noted.  Full range of motion  Skin:  Findings: No rash.  Neurological:     Mental Status: She is alert and oriented to person, place, and time.  Psychiatric:        Mood and Affect: Mood normal.     ED Results / Procedures / Treatments   Labs (all labs ordered are listed, but only abnormal results are displayed) Labs Reviewed - No data to display  EKG None  Radiology DG Hip Unilat W or Wo Pelvis 2-3 Views Left  Result Date: 11/29/2021 CLINICAL DATA:  Fall, pain EXAM: DG HIP (WITH OR WITHOUT PELVIS) 2-3V LEFT COMPARISON:  None Available. FINDINGS: There is no evidence of displaced hip fracture or dislocation. There is no evidence of arthropathy or other focal bone abnormality. Nonobstructive pattern of overlying bowel gas. Phleboliths in the pelvis. IMPRESSION: No displaced fracture or  dislocation of the left hip or pelvis. Electronically Signed   By: Delanna Ahmadi M.D.   On: 11/29/2021 13:14   DG Knee Complete 4 Views Left  Result Date: 11/29/2021 CLINICAL DATA:  Tripped and fell with subsequent pain. EXAM: LEFT KNEE - COMPLETE 4+ VIEW COMPARISON:  None FINDINGS: Prepatellar soft tissue swelling. No evidence of regional fracture. No joint effusion. Small calcifications in the retropatellar fat pad incidentally noted. IMPRESSION: Prepatellar soft tissue swelling. No evidence of fracture. No intra-articular fluid. Electronically Signed   By: Nelson Chimes M.D.   On: 11/29/2021 13:13   CT Head Wo Contrast  Result Date: 11/29/2021 CLINICAL DATA:  Fall, tripped and fell today, complaining of neck pain, on blood thinners, history of aneurysm repair EXAM: CT HEAD WITHOUT CONTRAST CT CERVICAL SPINE WITHOUT CONTRAST TECHNIQUE: Multidetector CT imaging of the head and cervical spine was performed following the standard protocol without intravenous contrast. Multiplanar CT image reconstructions of the cervical spine were also generated. RADIATION DOSE REDUCTION: This exam was performed according to the departmental dose-optimization program which includes automated exposure control, adjustment of the mA and/or kV according to patient size and/or use of iterative reconstruction technique. COMPARISON:  CT head 06/25/2015 FINDINGS: CT HEAD FINDINGS Brain: Normal ventricular morphology. No midline shift or mass effect. Old RIGHT basal ganglia lacunar infarct. Post craniotomy changes RIGHT frontal. No intracranial hemorrhage, mass lesion or evidence of acute infarction. No extra-axial fluid collections. Vascular: Vascular stent identified RIGHT ICA into RIGHT MCA. No hyperdense vessels. Skull: Prior RIGHT frontal craniotomy. Osseous structures otherwise unremarkable. Sinuses/Orbits: Clear Other: N/A CT CERVICAL SPINE FINDINGS Alignment: Normal Skull base and vertebrae: Osseous mineralization normal.  Visualized skull base intact. Vertebral body heights maintained. Disc space narrowing C4-C5 and C6-C7. No fracture, subluxation, or bone destruction. Soft tissues and spinal canal: Prevertebral soft tissues normal thickness. No cervical soft tissue abnormalities. Disc levels:  Unremarkable Upper chest: Visualized lung apices clear Other: N/A IMPRESSION: Prior RIGHT frontal craniotomy. Old RIGHT basal ganglia lacunar infarct. No acute intracranial abnormalities. Degenerative disc disease changes at C4-C5 and C6-C7. No acute cervical spine abnormalities. Electronically Signed   By: Lavonia Dana M.D.   On: 11/29/2021 13:04   CT Cervical Spine Wo Contrast  Result Date: 11/29/2021 CLINICAL DATA:  Fall, tripped and fell today, complaining of neck pain, on blood thinners, history of aneurysm repair EXAM: CT HEAD WITHOUT CONTRAST CT CERVICAL SPINE WITHOUT CONTRAST TECHNIQUE: Multidetector CT imaging of the head and cervical spine was performed following the standard protocol without intravenous contrast. Multiplanar CT image reconstructions of the cervical spine were also generated. RADIATION DOSE REDUCTION: This exam was performed according to the departmental dose-optimization program which  includes automated exposure control, adjustment of the mA and/or kV according to patient size and/or use of iterative reconstruction technique. COMPARISON:  CT head 06/25/2015 FINDINGS: CT HEAD FINDINGS Brain: Normal ventricular morphology. No midline shift or mass effect. Old RIGHT basal ganglia lacunar infarct. Post craniotomy changes RIGHT frontal. No intracranial hemorrhage, mass lesion or evidence of acute infarction. No extra-axial fluid collections. Vascular: Vascular stent identified RIGHT ICA into RIGHT MCA. No hyperdense vessels. Skull: Prior RIGHT frontal craniotomy. Osseous structures otherwise unremarkable. Sinuses/Orbits: Clear Other: N/A CT CERVICAL SPINE FINDINGS Alignment: Normal Skull base and vertebrae: Osseous  mineralization normal. Visualized skull base intact. Vertebral body heights maintained. Disc space narrowing C4-C5 and C6-C7. No fracture, subluxation, or bone destruction. Soft tissues and spinal canal: Prevertebral soft tissues normal thickness. No cervical soft tissue abnormalities. Disc levels:  Unremarkable Upper chest: Visualized lung apices clear Other: N/A IMPRESSION: Prior RIGHT frontal craniotomy. Old RIGHT basal ganglia lacunar infarct. No acute intracranial abnormalities. Degenerative disc disease changes at C4-C5 and C6-C7. No acute cervical spine abnormalities. Electronically Signed   By: Lavonia Dana M.D.   On: 11/29/2021 13:04    Procedures Procedures    Medications Ordered in ED Medications  acetaminophen (TYLENOL) tablet 1,000 mg (1,000 mg Oral Given 11/29/21 1322)    ED Course/ Medical Decision Making/ A&P                           Medical Decision Making Amount and/or Complexity of Data Reviewed Radiology: ordered.  Risk OTC drugs.   BP 123/77   Pulse 87   Temp 98.1 F (36.7 C) (Oral)   Resp 18   SpO2 100%   12:24 PM This is a 66 year old female with history of brain aneurysm who reportedly had a fall prior to arrival while she was at a friend's house taking care of her.  She fell forward did strike her head but denies any and loss of consciousness.  She reported her primary complaint is pain in her left knee as it struck the hardwood floor and she is unable to bear weight on that knee.  On exam, she does have exquisite tenderness about the left knee with edema and hematoma noted.  Will obtain x-ray of the knee.  She also has some tenderness to her left hip with full range of motion.  X-rays ordered.  Since patient is currently on blood thinner medication and did strike her head, will obtain head and cervical spine CT.  Will provide opiate pain medication.  Patient is mentating appropriately answer questions appropriately.  No focal neurodeficit on exam.  1:22  PM X-rays and CT scan was independently reviewed and interpreted by me and I agree with radiologist interpretation.  Head and cervical spine CT obtained without any acute finding.  X-ray of her hip show no evidence of displaced fracture or dislocation of the left hip or pelvis.  Since patient has full range of motion and is able to stand up and bears weight, I have low suspicion for occult fracture of her hip.  X-ray of her left knee shows prepatellar soft tissue swelling but no evidence of fracture and no intra-articular fluid.  I discussed this with patient and she is was happy to hear the results.  We will apply knee sleeves for support, crutches to help with weightbearing and patient may follow-up with orthopedist, Dr. Marlou Porch, for outpatient management.  RICE therapy discussed.  Tylenol was given for pain with improvement of  symptoms.  I have low suspicion for seizure, stroke, cardiac arrhythmia, or other inciting factor causing her fall.  She is mentating appropriately answer questions appropriately.  I have considered admission however patient able to bear weight and ambulate she is stable for discharge.  Social determinants that can affect her medical outcome including tobacco use.        Final Clinical Impression(s) / ED Diagnoses Final diagnoses:  Contusion of left knee, initial encounter  Fall, initial encounter    Rx / DC Orders ED Discharge Orders          Ordered    acetaminophen (TYLENOL) 500 MG tablet  Every 6 hours PRN        11/29/21 1327    cyclobenzaprine (FLEXERIL) 10 MG tablet  2 times daily PRN        11/29/21 1327              Domenic Moras, PA-C 11/29/21 1327    Hayden Rasmussen, MD 11/29/21 [phone]

## 2021-11-29 NOTE — ED Notes (Signed)
An After Visit Summary was printed and given to the patient. Discharge instructions given and no further questions at this time.  

## 2021-11-30 ENCOUNTER — Ambulatory Visit: Payer: Medicare Other

## 2021-11-30 ENCOUNTER — Telehealth: Payer: Self-pay

## 2021-11-30 NOTE — Telephone Encounter (Signed)
Transition Care Management Follow-up Telephone Call Date of discharge and from where: 11/29/2021 from [facility] How have you been since you were released from the hospital? Patient stated that she is feeling about the same and sore. Patient did not have any questions or concerns.  Any questions or concerns? No  Items Reviewed: Did the pt receive and understand the discharge instructions provided? Yes  Medications obtained and verified? Yes  Other? No  Any new allergies since your discharge? No  Dietary orders reviewed? No Do you have support at home? Yes   Functional Questionnaire: (I = Independent and D = Dependent) ADLs: I  Bathing/Dressing- I  Meal Prep- I  Eating- I  Maintaining continence- I  Transferring/Ambulation- I  Managing Meds- I   Follow up appointments reviewed:  PCP Hospital f/u appt confirmed? No   Specialist Hospital f/u appt confirmed? No   Are transportation arrangements needed? No  If their condition worsens, is the pt aware to call PCP or go to the Emergency Dept.? Yes Was the patient provided with contact information for the PCP's office or ED? Yes Was to pt encouraged to call back with questions or concerns? Yes

## 2021-11-30 NOTE — Telephone Encounter (Signed)
This nurse attempted to call patient three times as well as waited on my chart for scheduled virtual AWV. Message left that we will call back to reschedule for another time.

## 2021-12-05 ENCOUNTER — Telehealth: Payer: Self-pay | Admitting: Family Medicine

## 2021-12-05 NOTE — Telephone Encounter (Signed)
Left message for patient to call back and schedule Medicare Annual Wellness Visit (AWV) either virtually or in office. I left my number for patient to call [phone].   awvi 11/22/21 per [facility] please schedule at anytime with health coach  This should be a 45 minute visit.   

## 2021-12-06 ENCOUNTER — Ambulatory Visit (INDEPENDENT_AMBULATORY_CARE_PROVIDER_SITE_OTHER): Payer: Medicare Other | Admitting: Psychology

## 2021-12-06 DIAGNOSIS — F411 Generalized anxiety disorder: Secondary | ICD-10-CM

## 2021-12-06 DIAGNOSIS — F331 Major depressive disorder, recurrent, moderate: Secondary | ICD-10-CM

## 2021-12-06 NOTE — Progress Notes (Signed)
[facility] Counselor/Therapist Progress Note  Patient ID: JENAYE RICKERT, MRN: [id number]   Date: 12/06/21  Time Spent: 1:05  am - 1:49 am : 44 Minutes  Treatment Type: Individual Therapy.  Reported Symptoms: Anxiety, depression, and inattention.   Mental Status Exam: Appearance:  Fairly Groomed     Behavior: Appropriate  Motor: Normal  Speech/Language:  Clear and Coherent  Affect: Flat  Mood: anxious  Thought process: normal  Thought content:   WNL  Sensory/Perceptual disturbances:   WNL  Orientation: oriented to person, place, time/date, and situation  Attention: Good  Concentration: Good  Memory: WNL  Fund of knowledge:  Good  Insight:   Good  Judgment:  Good  Impulse Control: Good   Risk Assessment: Danger to Self:  No Self-injurious Behavior: No Danger to Others: No Duty to Warn:no Physical Aggression / Violence:No  Access to Firearms a concern: No  Gang Involvement:No   Subjective:   [name] participated from home, via video and consented to treatment. Therapist participated from office. We met online due to [place] pandemic. Siya reviewed the events of the past week. She noted her recent fall that resulted in a knee dislocation and hitting her head on the ground during the fall. She noted missing her grand-daughter's graduation as a result. She noted her son missing his daughter's graduation but making it to his other daughter's graduation. She noted being upset by her son's behavior and noted this affecting her sleep. She noted experiencing great disapproval of her son's behavior and noted that her son does not contact her any more. We worked on identifying areas of control and lack of control during the session. She discussed her worry and concern regarding her grand-daughter. We explored this worry, during the session, and worked on identifying ways to manage these worries and provide her grand-daughter additional support. Jessaca was engaged  and motivated during the session. Therapist provided supportive therapy.   Interventions: interpersonal    Diagnosis:  Generalized anxiety disorder  Moderate episode of recurrent major depressive disorder [facility])   Treatment Plan:  Client Abilities/Strengths Shantavia is intelligent, forthcoming, and motivated for change.   Support System: Family and friends.  Client Treatment Preferences OPT  Client Statement of Needs Shawndell would like to manage her anxiety,  learning about anxiety and depression, processing past events and relationships, increase self-care.   Treatment Level Weekly  Symptoms  Anxiety: Rumination, feeling anxious, difficulty managing worry, worrying about different things, trouble relaxing, restlessness, irritability, feeling something awful might happen.    (Status: maintained)  Depression: loss of interest, feeling down, fluctuating sleep, increased appetite & weight-gain, feeling bad about self, trouble concentrating, psycho-motor, low motivation,    (Status: maintained)  Goals:   Rowyn experiences symptoms of anxiety, depression, and inattention.    Target Date: 11/06/22 Frequency: Weekly  Progress: 0 Modality: individual    Therapist will provide referrals for additional resources as appropriate.  Therapist will provide psycho-education regarding Folashade's diagnosis and corresponding treatment approaches and interventions. Licensed Clinical Social Worker, [place], LCSW will support the patient's ability to achieve the goals identified. will employ CBT, BA, Problem-solving, Solution Focused, Mindfulness,  coping skills, & other evidenced-based practices will be used to promote progress towards healthy functioning to help manage decrease symptoms associated with her diagnosis.   Reduce overall level, frequency, and intensity of the feelings of depression, anxiety as evidenced by decreased overall symptoms from 6 to 7 days/week to 0 to 1 days/week per  client report  for at least 3 consecutive months. Verbally express understanding of the relationship between feelings of depression, anxiety and their impact on thinking patterns and behaviors. Verbalize an understanding of the role that distorted thinking plays in creating fears, excessive worry, and ruminations.   [name] participated in the creation of the treatment plan)   [name], LCSW

## 2021-12-11 ENCOUNTER — Encounter: Payer: Self-pay | Admitting: Family Medicine

## 2021-12-11 ENCOUNTER — Telehealth: Payer: Self-pay | Admitting: Psychology

## 2021-12-11 ENCOUNTER — Ambulatory Visit (INDEPENDENT_AMBULATORY_CARE_PROVIDER_SITE_OTHER): Payer: Medicare Other | Admitting: Family Medicine

## 2021-12-11 VITALS — BP 130/80 | HR 93 | Temp 97.5°F | Wt 148.4 lb

## 2021-12-11 DIAGNOSIS — S83002A Unspecified subluxation of left patella, initial encounter: Secondary | ICD-10-CM

## 2021-12-11 DIAGNOSIS — S8012XA Contusion of left lower leg, initial encounter: Secondary | ICD-10-CM | POA: Diagnosis not present

## 2021-12-11 NOTE — Progress Notes (Signed)
   Subjective:    Patient ID: Elizabeth Phelps, female    DOB: May 04, 1956, 66 y.o.   MRN: [id number]  HPI She injured herself on June 8 falling and landing especially doing some damage to the left patella.  She states that she felt as if the patella was pushed laterally.  She was seen in [facility].  They gave her an extensive work-up for that as well as general injury with CT scans.   Review of Systems     Objective:   Physical Exam Exam of the left knee does show positive apprehension sign.  Minimal effusion is noted.  Ecchymosis is noted to the lateral calf area.  Anterior drawer negative.  McMurray's testing not done due to pain. The emergency room record including x-rays and CT scan was reviewed.      Assessment & Plan:  Acquired subluxation of left patella, initial encounter - Plan: Ambulatory referral to Orthopedic Surgery  Contusion of left lower extremity, initial encounter I did not have a knee immobilizer so I wrapped the knee, she will use heat to the area, ibuprofen and I will get her follow-up with orthopedics for more definitive care for the possible subluxing patella.

## 2021-12-11 NOTE — Telephone Encounter (Signed)
Therapist contacted Elizabeth Phelps and informed her of disability determination service's request for therapeutic notes. There is an ROI included in the request. Elizabeth Phelps provided verbal permission to send requested information.

## 2021-12-11 NOTE — Patient Instructions (Signed)
take 800 mg of ibuprofen 3 times per day.  Keep your leg from being bent.Marland Kitchen  Keep the wrap on.  Heat for 20 minutes 3 times per day and follow-up with orthopedic surgery

## 2021-12-14 ENCOUNTER — Ambulatory Visit (INDEPENDENT_AMBULATORY_CARE_PROVIDER_SITE_OTHER): Payer: Medicare Other

## 2021-12-14 VITALS — Ht 63.5 in | Wt 145.0 lb

## 2021-12-14 DIAGNOSIS — M25562 Pain in left knee: Secondary | ICD-10-CM | POA: Diagnosis not present

## 2021-12-14 DIAGNOSIS — Z0000 Encounter for general adult medical examination without abnormal findings: Secondary | ICD-10-CM

## 2021-12-17 ENCOUNTER — Telehealth: Payer: Self-pay

## 2021-12-17 ENCOUNTER — Ambulatory Visit (INDEPENDENT_AMBULATORY_CARE_PROVIDER_SITE_OTHER): Payer: Medicare Other | Admitting: Psychology

## 2021-12-17 DIAGNOSIS — F331 Major depressive disorder, recurrent, moderate: Secondary | ICD-10-CM | POA: Diagnosis not present

## 2021-12-17 DIAGNOSIS — S8002XA Contusion of left knee, initial encounter: Secondary | ICD-10-CM | POA: Diagnosis not present

## 2021-12-17 DIAGNOSIS — F411 Generalized anxiety disorder: Secondary | ICD-10-CM | POA: Diagnosis not present

## 2021-12-20 ENCOUNTER — Telehealth: Payer: Self-pay

## 2021-12-20 NOTE — Telephone Encounter (Signed)
   Telephone encounter was:  Successful.  12/20/2021 Name: JEWELS LANGONE MRN: [id number] DOB: 1956/02/13  CATHYE KREITER is a 66 y.o. year old female who is a primary care patient of [employer], [name], MD . The community resource team was consulted for assistance with food pantries,  transportation, meals on wheels and senior center information.  Care guide performed the following interventions: Spoke with patient, she has received the email sent with information for food pantries,  transportation, meals on wheels and senior center information.  Follow Up Plan:  No further follow up planned at this time. The patient has been provided with needed resources.  Ladeidra Borys, AAS Paralegal, [place] [facility]  [address], [place] [address] ??[email or web address]  ?? [phone]   [email or web address]

## 2021-12-21 DIAGNOSIS — S83012D Lateral subluxation of left patella, subsequent encounter: Secondary | ICD-10-CM | POA: Diagnosis not present

## 2022-01-01 ENCOUNTER — Ambulatory Visit: Payer: Medicare Other | Admitting: Psychology

## 2022-01-01 DIAGNOSIS — S83012D Lateral subluxation of left patella, subsequent encounter: Secondary | ICD-10-CM | POA: Diagnosis not present

## 2022-01-02 DIAGNOSIS — K766 Portal hypertension: Secondary | ICD-10-CM | POA: Diagnosis not present

## 2022-01-02 DIAGNOSIS — K754 Autoimmune hepatitis: Secondary | ICD-10-CM | POA: Diagnosis not present

## 2022-01-02 DIAGNOSIS — K7469 Other cirrhosis of liver: Secondary | ICD-10-CM | POA: Diagnosis not present

## 2022-01-02 DIAGNOSIS — K3189 Other diseases of stomach and duodenum: Secondary | ICD-10-CM | POA: Insufficient documentation

## 2022-01-02 DIAGNOSIS — K5909 Other constipation: Secondary | ICD-10-CM | POA: Diagnosis not present

## 2022-01-02 DIAGNOSIS — K743 Primary biliary cirrhosis: Secondary | ICD-10-CM | POA: Diagnosis not present

## 2022-01-03 ENCOUNTER — Telehealth: Payer: Self-pay | Admitting: Gastroenterology

## 2022-01-03 NOTE — Telephone Encounter (Signed)
Please arrange a clinic visit with me.  - HD

## 2022-01-03 NOTE — Telephone Encounter (Signed)
Called and spoke with patient regarding information below. Pt has been advised that she will not be due for a colonoscopy for another 6 years. She reports that she is currently taking Plavix, she reports that Dr. Estanislado Pandy placed her on Plavix last August due to cerebral aneurysm treatments, she has 2 stents. Her PCP is currently managing her Plavix. Pt knows that I will be in contact regarding next steps. Pt verbalized understanding and had no concerns at the end of the call.

## 2022-01-03 NOTE — Telephone Encounter (Signed)
I received a note from Elizabeth Phelps's last visit to the [facility] clinic, where she is treated for her cirrhosis and PBC.  That clinic provider wants Lareina to have an upper endoscopy for variceal screening (was last done in 2019), and questioned whether or not she was due for screening colonoscopy.  Elizabeth Phelps had no polyps on her 2019 colonoscopy, so she is not due for colonoscopy for another 6 years.  However, she does need a screening EGD to see if there are esophageal or gastric varices.  Chart review indicates that she was put on Plavix at some point, possibly for some cerebral aneurysm treatments, though I am not certain of this details.  Please contact her, find out if she is still on Plavix and, if so, which physician is prescribing it.  Then I can decide if she needs a clinic appointment with me versus a directly- booked procedure in the [facility].  HD

## 2022-01-03 NOTE — Telephone Encounter (Signed)
Attempted to reach patient. I left her a detailed vm letting her know that Dr. Loletha Carrow would like to see her for an office visit. I asked that the patient call back at her convenience to schedule a follow up appointment.

## 2022-01-04 ENCOUNTER — Ambulatory Visit (INDEPENDENT_AMBULATORY_CARE_PROVIDER_SITE_OTHER): Payer: Medicare Other | Admitting: Psychology

## 2022-01-04 DIAGNOSIS — F331 Major depressive disorder, recurrent, moderate: Secondary | ICD-10-CM | POA: Diagnosis not present

## 2022-01-04 DIAGNOSIS — F411 Generalized anxiety disorder: Secondary | ICD-10-CM | POA: Diagnosis not present

## 2022-01-04 NOTE — Progress Notes (Unsigned)
[facility] Counselor/Therapist Progress Note  Patient ID: Elizabeth Phelps, MRN: [id number]   Date: 01/04/22  Time Spent: 10:10  am - 10-56 am : 49 Minutes  Treatment Type: Individual Therapy.  Reported Symptoms: Anxiety, depression, and inattention.   Mental Status Exam: Appearance:  Neat     Behavior: Appropriate  Motor: Normal  Speech/Language:  Clear and Coherent  Affect: Flat  Mood: anxious  Thought process: normal  Thought content:   WNL  Sensory/Perceptual disturbances:   WNL  Orientation: oriented to person, place, time/date, and situation  Attention: Good  Concentration: Good  Memory: WNL  Fund of knowledge:  Good  Insight:   Good  Judgment:  Good  Impulse Control: Good   Risk Assessment: Danger to Self:  No Self-injurious Behavior: No Danger to Others: No Duty to Warn:no Physical Aggression / Violence:No  Access to Firearms a concern: No  Gang Involvement:No   Subjective:   Elizabeth Phelps participated from home, via video and consented to treatment. Therapist participated from home office. We met online due to [place] pandemic. Elizabeth Phelps reviewed the events of the past week. She noted contacting her son to set up a swimming occasion with his two children and noted being "chewed and spit out". She noted her attempts to reach out to her ex-husband to address the issue and found this process to be frustrating. She discussed the effect of these stressors on her mood and level of anxiety. We worked on identifying boundaries going forward in relation to her son. We discussed the importance of self-care and maintaining her overall mood. Elizabeth Phelps discussed working on managing her frustrations proactively. Therapist praised Elizabeth Phelps for her effort in this area and encouraged continued boundary setting and outlining her expectations towards her son. Elizabeth Phelps was engaged and motivated and expressed commitment towards our goals. Therapist praised Elizabeth Phelps and provided supportive  therapy. A follow-up was scheduled for continued treatment.   Interventions: interpersonal    Diagnosis:  No diagnosis found.   Treatment Plan:  Client Abilities/Strengths [name] is intelligent, forthcoming, and motivated for change.   Support System: Family and friends.  Client Treatment Preferences OPT  Client Statement of Needs [name] would like to manage her anxiety,  learning about anxiety and depression, processing past events and relationships, increase self-care.   Treatment Level Weekly  Symptoms  Anxiety: Rumination, feeling anxious, difficulty managing worry, worrying about different things, trouble relaxing, restlessness, irritability, feeling something awful might happen.    (Status: maintained)  Depression: loss of interest, feeling down, fluctuating sleep, increased appetite & weight-gain, feeling bad about self, trouble concentrating, psycho-motor, low motivation,    (Status: maintained)  Goals:   Elizabeth Phelps experiences symptoms of anxiety, depression, and inattention.    Target Date: 11/06/22 Frequency: Weekly  Progress: 0 Modality: individual    Therapist will provide referrals for additional resources as appropriate.  Therapist will provide psycho-education regarding Elizabeth Phelps's diagnosis and corresponding treatment approaches and interventions. Licensed Clinical Social Worker, [place], LCSW will support the patient's ability to achieve the goals identified. will employ CBT, BA, Problem-solving, Solution Focused, Mindfulness,  coping skills, & other evidenced-based practices will be used to promote progress towards healthy functioning to help manage decrease symptoms associated with her diagnosis.   Reduce overall level, frequency, and intensity of the feelings of depression, anxiety as evidenced by decreased overall symptoms from 6 to 7 days/week to 0 to 1 days/week per client report for at least 3 consecutive months. Verbally express understanding of the  relationship  between feelings of depression, anxiety and their impact on thinking patterns and behaviors. Verbalize an understanding of the role that distorted thinking plays in creating fears, excessive worry, and ruminations.   [name] participated in the creation of the treatment plan)   [name], LCSW

## 2022-01-04 NOTE — Telephone Encounter (Signed)
Called and spoke with patient. She has been scheduled for a follow up appt with Dr. Loletha Carrow on Tuesday, 02/12/22 at 1:40 pm. Pt is aware that her appt information will be available in MyChart. Pt verbalized understanding and had no concerns at the end of the call.

## 2022-01-08 DIAGNOSIS — S83012D Lateral subluxation of left patella, subsequent encounter: Secondary | ICD-10-CM | POA: Diagnosis not present

## 2022-01-09 DIAGNOSIS — M25562 Pain in left knee: Secondary | ICD-10-CM | POA: Diagnosis not present

## 2022-01-14 ENCOUNTER — Ambulatory Visit (INDEPENDENT_AMBULATORY_CARE_PROVIDER_SITE_OTHER): Payer: Medicare Other | Admitting: Psychology

## 2022-01-14 DIAGNOSIS — F411 Generalized anxiety disorder: Secondary | ICD-10-CM | POA: Diagnosis not present

## 2022-01-14 DIAGNOSIS — F331 Major depressive disorder, recurrent, moderate: Secondary | ICD-10-CM | POA: Diagnosis not present

## 2022-01-14 NOTE — Progress Notes (Signed)
[facility] Counselor/Therapist Progress Note  Patient ID: Elizabeth Phelps, MRN: [id number]   Date: 01/14/22  Time Spent: 2:33 pm - 3:30 am : 84 Minutes  Treatment Type: Individual Therapy.  Reported Symptoms: Anxiety, depression, and inattention.   Mental Status Exam: Appearance:  Neat     Behavior: Appropriate  Motor: Normal  Speech/Language:  Clear and Coherent  Affect: Flat  Mood: anxious  Thought process: normal  Thought content:   WNL  Sensory/Perceptual disturbances:   WNL  Orientation: oriented to person, place, time/date, and situation  Attention: Good  Concentration: Good  Memory: WNL  Fund of knowledge:  Good  Insight:   Good  Judgment:  Good  Impulse Control: Good   Risk Assessment: Danger to Self:  No Self-injurious Behavior: No Danger to Others: No Duty to Warn:no Physical Aggression / Violence:No  Access to Firearms a concern: No  Gang Involvement:No   Subjective:   [name] participated from home, via video and consented to treatment. Therapist participated from office. We met online due to [place] pandemic. Aleksandra reviewed the events of the past week. Shiree noted rumination regarding her grand-daughter's possible response to disappointment from her father, Sumayya's son. She noted her brain "never shutting off".  She noted replaying varying scenarios in her head and noting needing this to be able to be prepared and "know how to respond". We discussed the pros and cons of this approach and the effect of this on her outlook, mood, and mood management. She provided a history regarding family dynamics and noted her father giving up rights to her in order to avoid child-support. She noted often being grounded by her mother and noted her mother having strict boundaries. She noted having yet to forgive her father. He reentered her life at age 75. She noted, at age 63, being shown the parental termination paperwork. "This man threw me away, he didn't  want me". She noted feeling better, during the session, after discussing her father. We will continue to process this, going forward. Shamaya was engaged and motivated and expressed commitment towards our goals. A follow-up was scheduled for continued treatment.   Interventions: interpersonal    Diagnosis:  Generalized anxiety disorder  Moderate episode of recurrent major depressive disorder [facility])   Treatment Plan:  Client Abilities/Strengths Alexus is intelligent, forthcoming, and motivated for change.   Support System: Family and friends.  Client Treatment Preferences OPT  Client Statement of Needs Jeydi would like to manage her anxiety,  learning about anxiety and depression, processing past events and relationships, increase self-care.   Treatment Level Weekly  Symptoms  Anxiety: Rumination, feeling anxious, difficulty managing worry, worrying about different things, trouble relaxing, restlessness, irritability, feeling something awful might happen.    (Status: maintained)  Depression: loss of interest, feeling down, fluctuating sleep, increased appetite & weight-gain, feeling bad about self, trouble concentrating, psycho-motor, low motivation,    (Status: maintained)  Goals:   Wynonna experiences symptoms of anxiety, depression, and inattention.    Target Date: 11/06/22 Frequency: Weekly  Progress: 0 Modality: individual    Therapist will provide referrals for additional resources as appropriate.  Therapist will provide psycho-education regarding Kalyiah's diagnosis and corresponding treatment approaches and interventions. Licensed Clinical Social Worker, [place], LCSW will support the patient's ability to achieve the goals identified. will employ CBT, BA, Problem-solving, Solution Focused, Mindfulness,  coping skills, & other evidenced-based practices will be used to promote progress towards healthy functioning to help manage decrease symptoms associated with  her  diagnosis.   Reduce overall level, frequency, and intensity of the feelings of depression, anxiety as evidenced by decreased overall symptoms from 6 to 7 days/week to 0 to 1 days/week per client report for at least 3 consecutive months. Verbally express understanding of the relationship between feelings of depression, anxiety and their impact on thinking patterns and behaviors. Verbalize an understanding of the role that distorted thinking plays in creating fears, excessive worry, and ruminations.   [name] participated in the creation of the treatment plan)   [name], LCSW

## 2022-01-15 DIAGNOSIS — S83012D Lateral subluxation of left patella, subsequent encounter: Secondary | ICD-10-CM | POA: Diagnosis not present

## 2022-01-28 ENCOUNTER — Ambulatory Visit (INDEPENDENT_AMBULATORY_CARE_PROVIDER_SITE_OTHER): Payer: Medicare Other | Admitting: Psychology

## 2022-01-28 ENCOUNTER — Other Ambulatory Visit: Payer: Self-pay | Admitting: Nurse Practitioner

## 2022-01-28 DIAGNOSIS — F331 Major depressive disorder, recurrent, moderate: Secondary | ICD-10-CM | POA: Diagnosis not present

## 2022-01-28 DIAGNOSIS — K7469 Other cirrhosis of liver: Secondary | ICD-10-CM

## 2022-01-28 DIAGNOSIS — F411 Generalized anxiety disorder: Secondary | ICD-10-CM | POA: Diagnosis not present

## 2022-01-28 NOTE — Progress Notes (Signed)
[facility] Counselor/Therapist Progress Note  Patient ID: Elizabeth Phelps, MRN: [id number]   Date: 01/28/22  Time Spent: 12:36 pm - 1:29 pm : 53 Minutes  Treatment Type: Individual Therapy.  Reported Symptoms: Anxiety, depression, and inattention.   Mental Status Exam: Appearance:  Neat     Behavior: Appropriate  Motor: Normal  Speech/Language:  Clear and Coherent  Affect: Flat  Mood: anxious  Thought process: normal  Thought content:   WNL  Sensory/Perceptual disturbances:   WNL  Orientation: oriented to person, place, time/date, and situation  Attention: Good  Concentration: Good  Memory: WNL  Fund of knowledge:  Good  Insight:   Good  Judgment:  Good  Impulse Control: Good   Risk Assessment: Danger to Self:  No Self-injurious Behavior: No Danger to Others: No Duty to Warn:no Physical Aggression / Violence:No  Access to Firearms a concern: No  Gang Involvement:No   Subjective:   Elizabeth Phelps participated from home, via video and consented to treatment. Therapist participated from office. We met online due to [place] pandemic. Elizabeth Phelps reviewed the events of the past week. She noted an increase in interpersonal stressors, with her daughter and daughter's mother. We explored this during the session and processed the events and her possible ownership in this situation. We discussed ways to communicate concerns and resolve conflict positively. Therapist challenged Elizabeth Phelps during the session and identified numerous possible perspectives in relation to the situation. Therapist modeled positive communication during the session. Elizabeth Phelps was engaged and motivated and expressed commitment towards our goals. A follow-up was scheduled for continued treatment.   Interventions: interpersonal    Diagnosis:  Generalized anxiety disorder  Moderate episode of recurrent major depressive disorder [facility])   Treatment Plan:  Client Abilities/Strengths Elizabeth Phelps is intelligent,  forthcoming, and motivated for change.   Support System: Family and friends.  Client Treatment Preferences OPT  Client Statement of Needs Elizabeth Phelps would like to manage her anxiety,  learning about anxiety and depression, processing past events and relationships, increase self-care.   Treatment Level Weekly  Symptoms  Anxiety: Rumination, feeling anxious, difficulty managing worry, worrying about different things, trouble relaxing, restlessness, irritability, feeling something awful might happen.    (Status: maintained)  Depression: loss of interest, feeling down, fluctuating sleep, increased appetite & weight-gain, feeling bad about self, trouble concentrating, psycho-motor, low motivation,    (Status: maintained)  Goals:   c experiences symptoms of anxiety, depression, and inattention.    Target Date: 11/06/22 Frequency: Weekly  Progress: 0 Modality: individual    Therapist will provide referrals for additional resources as appropriate.  Therapist will provide psycho-education regarding Elizabeth Phelps's diagnosis and corresponding treatment approaches and interventions. Licensed Clinical Social Worker, [place], LCSW will support the patient's ability to achieve the goals identified. will employ CBT, BA, Problem-solving, Solution Focused, Mindfulness,  coping skills, & other evidenced-based practices will be used to promote progress towards healthy functioning to help manage decrease symptoms associated with her diagnosis.   Reduce overall level, frequency, and intensity of the feelings of depression, anxiety as evidenced by decreased overall symptoms from 6 to 7 days/week to 0 to 1 days/week per client report for at least 3 consecutive months. Verbally express understanding of the relationship between feelings of depression, anxiety and their impact on thinking patterns and behaviors. Verbalize an understanding of the role that distorted thinking plays in creating fears, excessive worry,  and ruminations.   Elizabeth Phelps participated in the creation of the treatment plan)   Elizabeth Irish, LCSW

## 2022-01-30 ENCOUNTER — Other Ambulatory Visit: Payer: Medicare Other

## 2022-02-07 ENCOUNTER — Other Ambulatory Visit: Payer: Medicare Other

## 2022-02-11 ENCOUNTER — Ambulatory Visit
Admission: RE | Admit: 2022-02-11 | Discharge: 2022-02-11 | Disposition: A | Discharge status: Home / Self Care | Payer: Medicare Other | Source: Ambulatory Visit | Attending: Nurse Practitioner | Admitting: Nurse Practitioner

## 2022-02-11 DIAGNOSIS — K7460 Unspecified cirrhosis of liver: Secondary | ICD-10-CM | POA: Diagnosis not present

## 2022-02-11 DIAGNOSIS — K7469 Other cirrhosis of liver: Secondary | ICD-10-CM

## 2022-02-12 ENCOUNTER — Encounter: Payer: Self-pay | Admitting: Gastroenterology

## 2022-02-12 ENCOUNTER — Ambulatory Visit (INDEPENDENT_AMBULATORY_CARE_PROVIDER_SITE_OTHER): Payer: Medicare Other | Admitting: Gastroenterology

## 2022-02-12 ENCOUNTER — Telehealth: Payer: Self-pay

## 2022-02-12 VITALS — BP 116/68 | HR 69 | Ht 63.5 in | Wt 146.0 lb

## 2022-02-12 DIAGNOSIS — K754 Autoimmune hepatitis: Secondary | ICD-10-CM | POA: Diagnosis not present

## 2022-02-12 DIAGNOSIS — K7460 Unspecified cirrhosis of liver: Secondary | ICD-10-CM | POA: Diagnosis not present

## 2022-02-12 DIAGNOSIS — Z7902 Long term (current) use of antithrombotics/antiplatelets: Secondary | ICD-10-CM | POA: Diagnosis not present

## 2022-02-12 DIAGNOSIS — K743 Primary biliary cirrhosis: Secondary | ICD-10-CM

## 2022-02-12 NOTE — Telephone Encounter (Signed)
February 12, 2022   [name] [place] [address]   Dear Estanislado Pandy,       Elizabeth Phelps [birth year] [id number]  Dear Estanislado Pandy,   We have scheduled the above named patient for a(n) Upper endoscopy procedure. Our records show that (s)he is on anticoagulation therapy.  Please advise as to whether the patient may come off their therapy of Plavix for  5 days prior to their procedure which is scheduled for 03/05/22.  Please route your response to Barb Merino, RN  or fax response to [phone].  Sincerely

## 2022-02-12 NOTE — Progress Notes (Addendum)
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  I last saw her in December 2019, after which she has been managed for this condition by the [facility] clinic.  She was having difficulty tolerating Actigall and was changed to obeticholic acid.  AZA later added for AIH overlap.(MELD 6 at [facility] 01/02/22) I received a note from them in the last 2 months requesting an upper endoscopy for variceal screening.  I also question whether Elizabeth Phelps was due for surveillance colonoscopy.  Chart review clarified that she had a colonoscopy with me in 2019 and no polyps were discovered, 10-year recall was recommended. Chart review indicated that Elizabeth Phelps is on Plavix for history of cerebrovascular disease. ______________  Elizabeth Phelps reports she is feeling well overall.  She denies abdominal pain, nausea, vomiting, dysphagia, altered bowel habits or rectal bleeding.   ROS:  Review of Systems  Constitutional:  Negative for appetite change and unexpected weight change.  HENT:  Negative for mouth sores and voice change.   Eyes:  Negative for pain and redness.  Respiratory:  Negative for cough and shortness of breath.   Cardiovascular:  Negative for chest pain and palpitations.  Genitourinary:  Negative for dysuria and hematuria.  Musculoskeletal:  Positive for arthralgias. Negative for myalgias.  Skin:  Negative for pallor and rash.  Neurological:  Positive for headaches. Negative for weakness.  Hematological:  Negative for adenopathy.  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   Patient has a negative AMA, ANA was positive in a dual nuclear and speckled pattern with a titer of 1:1280, ASMA was weakly positive at 27, and a mildly elevated IgG. Biopsy reported mild to moderate interface hepatitis.  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   Complication of anesthesia    hard to wake up after brain surgery in 2002   Diverticulosis 07/21/2020   Dysthymia 04/03/2012   Generalized anxiety disorder    History of colonic polyps 08/12/2016   Hyperlipidemia 08/12/2016   LDL goal <100   Incomplete right bundle branch block 09/22/2017   Major depressive disorder    Migraine headache    Primary biliary cholangitis 08/04/2018   Primary biliary cirrhosis    Smoker      Past Surgical History: Past Surgical History:  Procedure Laterality Date   APPENDECTOMY     BRAIN SURGERY     BREAST EXCISIONAL BIOPSY Left    CATARACT EXTRACTION Right 2018   COLONOSCOPY  2007   Dr. Collene Mares   IR ANGIO INTRA EXTRACRAN SEL COM CAROTID INNOMINATE BILAT MOD SED  01/04/2021   IR ANGIO INTRA EXTRACRAN SEL INTERNAL CAROTID UNI R MOD SED  01/24/2021   IR ANGIO VERTEBRAL SEL SUBCLAVIAN INNOMINATE UNI L MOD SED  01/04/2021   IR ANGIO VERTEBRAL SEL VERTEBRAL UNI R MOD SED  01/04/2021   IR CT HEAD LTD  01/24/2021   IR RADIOLOGIST EVAL & MGMT  12/22/2020   IR RADIOLOGIST EVAL & MGMT  01/10/2021   IR RADIOLOGIST EVAL & MGMT  02/08/2021   IR TRANSCATH/EMBOLIZ  01/24/2021   IR US GUIDE VASC ACCESS RIGHT  01/04/2021   IR US GUIDE VASC ACCESS RIGHT  01/24/2021   KNEE ARTHROSCOPY Right    RADIOLOGY WITH ANESTHESIA N/A 01/24/2021   Procedure: RADIOLOGY WITH ANESTHESIA EMBOLIZATION;  Surgeon: Luanne Bras, MD;  Location: [facility];  Service: Radiology;  Laterality: N/A;    TONSILLECTOMY AND ADENOIDECTOMY     age 66     Family History: Family History  Problem Relation Age of Onset   Stroke Mother    Hypertension Mother    Prostate cancer Father        mets   Aneurysm Father    Breast cancer Maternal Grandmother    Colon cancer Neg Hx    Stomach cancer Neg Hx    Esophageal cancer Neg Hx     Social History: Social History   Socioeconomic History   Marital status: Divorced    Spouse name: Not on file   Number of children: 2   Years of education: 12   Highest education level: High school graduate  Occupational History   Occupation: Unemployed    Comment: Prior customer service  Tobacco Use   Smoking status: Every Day    Packs/day: 0.50    Types: Cigarettes   Smokeless tobacco: Never  Vaping Use   Vaping Use: Never used  Substance and Sexual Activity   Alcohol use: No   Drug use: Yes    Types: Marijuana    Comment: several times per week to help with anxiety   Sexual activity: Not Currently  Other Topics Concern   Not on file  Social History Narrative   Right handed   Highest level of edu- HS   Lives in one story home with two roommates   Social Determinants of Health   Financial Resource Strain: Medium Risk (12/14/2021)   Overall Financial Resource Strain (CARDIA)    Difficulty of Paying Living Expenses: Somewhat hard  Food Insecurity: Food Insecurity Present (12/17/2021)   Hunger Vital Sign    Worried About Running Out of Food in the Last Year: Sometimes true    Ran Out of Food in the Last Year: Sometimes true  Transportation Needs: No Transportation Needs (12/14/2021)   PRAPARE - Hydrologist (Medical): No    Lack of Transportation (Non-Medical): No  Physical Activity: Inactive (12/14/2021)   Exercise Vital Sign    Days of Exercise per Week: 0 days    Minutes of Exercise per Session: 0 min  Stress: Stress Concern Present (12/14/2021)   [place]    Feeling of Stress : To some extent  Social Connections: Not on file    Allergies: Allergies  Allergen Reactions   Codeine Nausea And Vomiting    Outpatient Meds: Current Outpatient Medications  Medication Sig Dispense Refill   aspirin EC 81 MG tablet Take 81 mg by mouth daily. Swallow whole.     atorvastatin (LIPITOR) 40 MG tablet Take 1 tablet (40 mg total) by mouth daily. 90 tablet 3   azaTHIOprine (IMURAN) 50 MG tablet Take 100 mg by mouth at bedtime.     clopidogrel (PLAVIX) 75 MG tablet Take 1 tablet (75 mg total) by mouth daily.  30 tablet 3   ibuprofen (ADVIL) 200 MG tablet Take 400 mg by mouth every 6 (six) hours as needed for headache.     Multiple Vitamin (MULTIVITAMIN WITH MINERALS) TABS Take 1 tablet by mouth at bedtime.     OCALIVA 5 MG TABS Take 5 mg by mouth at bedtime.     PARoxetine (PAXIL) 20 MG tablet Take 1 tablet (20 mg total) by mouth daily. 90 tablet 1   zolmitriptan (ZOMIG) 5 MG tablet Take 1 tablet (5 mg total) by mouth as needed for migraine. 10 tablet 11   acetaminophen (TYLENOL) 500 MG tablet Take 2 tablets (1,000 mg total) by mouth every 6 (six) hours as needed for moderate pain. (Patient not taking: Reported on 12/14/2021) 30 tablet 0   cyclobenzaprine (FLEXERIL) 10 MG tablet Take 1 tablet (10 mg total) by mouth 2 (two) times daily as needed for muscle spasms. (Patient not taking: Reported on 12/11/2021) 20 tablet 0   No current facility-administered medications for this visit.      ___________________________________________________________________ Objective   Exam:  BP 116/68   Pulse 69   Ht 5' 3.5" (1.613 m)   Wt 146 lb (66.2 kg)   SpO2 96%   BMI 25.46 kg/m  Wt Readings from Last 3 Encounters:  02/12/22 146 lb (66.2 kg)  12/14/21 145 lb (65.8 kg)  12/11/21 148 lb 6.4 oz (67.3 kg)    General: Alert and conversational.  Steady gait, fluent speech Eyes: sclera anicteric, no redness ENT: oral mucosa moist without lesions,  no cervical or supraclavicular lymphadenopathy CV: Regular without murmur, no JVD, no peripheral edema Resp: clear to auscultation bilaterally, normal RR and effort noted GI: soft, no tenderness, with active bowel sounds. No guarding or palpable organomegaly noted. Skin; warm and dry, no rash or jaundice noted Neuro: awake, alert and oriented x 3. Normal gross motor function and no asterixis Labs:  Labs found in care everywhere from 01/02/2022  CBC normal (platelets 178) INR 1.0 Creatinine 0.77, albumin 4.1, AST 49, ALT 23, alkaline phosphatase 102, total bilirubin 0.6 AFP normal at 2.9 Radiologic Studies:  Elizabeth Phelps says she had a screening liver ultrasound done yesterday, and she received a call report that it was normal  Assessment: Encounter Diagnoses  Name Primary?   Hepatic cirrhosis, unspecified hepatic cirrhosis type, unspecified whether ascites present ([place]) Yes   Primary biliary cholangitis    Autoimmune hepatitis    Long term (current) use of antithrombotics/antiplatelets     Chronic stable cirrhosis, her hepatologist has requested an EGD to screen for esophageal varices.  Low MELD score, normal platelets.  Plavix must be held 5 days prior to procedure if possible.  We will clear this with her neurosurgeon. Plan:  She was agreeable to an EGD after discussion of procedure and risks.  The benefits and risks of the planned procedure were described in detail with the patient or (when appropriate) their health care proxy.  Risks were outlined as including, but not limited to, bleeding, infection, perforation, adverse medication reaction leading to cardiac or pulmonary decompensation, pancreatitis (if ERCP).  The limitation of incomplete mucosal visualization was also discussed.  No guarantees or warranties were given.  Further treatment and follow-up with [facility] regarding her overlap syndrome.   Thank you for the courtesy of this consult.  Please call me with any  questions or concerns.  Nelida Meuse III  CC: Referring provider noted above

## 2022-02-12 NOTE — Patient Instructions (Addendum)
      If you are age 66 or older, your body mass index should be between 23-30. Your Body mass index is 25.46 kg/m. If this is out of the aforementioned range listed, please consider follow up with your Primary Care Provider.  If you are age 96 or younger, your body mass index should be between 19-25. Your Body mass index is 25.46 kg/m. If this is out of the aformentioned range listed, please consider follow up with your Primary Care Provider.   ________________________________________________________  The [facility] providers would like to encourage you to use [facility] to communicate with providers for non-urgent requests or questions.  Due to long hold times on the telephone, sending your provider a message by [facility] may be a faster and more efficient way to get a response.  Please allow 48 business hours for a response.  Please remember that this is for non-urgent requests.  _______________________________________________________   Dennis Bast have been scheduled for an endoscopy. Please follow written instructions given to you at your visit today. If you use inhalers (even only as needed), please bring them with you on the day of your procedure.   Due to recent changes in healthcare laws, you may see the results of your imaging and laboratory studies on MyChart before your provider has had a chance to review them.  We understand that in some cases there may be results that are confusing or concerning to you. Not all laboratory results come back in the same time frame and the provider may be waiting for multiple results in order to interpret others.  Please give [place] 48 hours in order for your provider to thoroughly review all the results before contacting the office for clarification of your results.    It was a pleasure to see you today!  Thank you for trusting me with your gastrointestinal care!

## 2022-02-20 ENCOUNTER — Ambulatory Visit (INDEPENDENT_AMBULATORY_CARE_PROVIDER_SITE_OTHER): Payer: Medicare Other | Admitting: Psychology

## 2022-02-20 DIAGNOSIS — F331 Major depressive disorder, recurrent, moderate: Secondary | ICD-10-CM

## 2022-02-20 DIAGNOSIS — F411 Generalized anxiety disorder: Secondary | ICD-10-CM | POA: Diagnosis not present

## 2022-02-20 NOTE — Telephone Encounter (Signed)
Left message for patient to call back Last dose of Plavix should be 02/27/22.

## 2022-02-20 NOTE — Telephone Encounter (Signed)
I reviewed Ms. Jetter's chart and images with Dr. Estanislado Pandy. He is OK with her holding her Plavix x 5 days for colonoscopy, however she MUST be on an 81 mg aspirin, she CANNOT stop aspirin.  Tiffanie Blassingame S Brentley Horrell PA-C 02/20/2022 1:25 PM
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
## 2022-02-21 NOTE — Telephone Encounter (Signed)
Left message for patient to call back  

## 2022-02-22 NOTE — Telephone Encounter (Signed)
Patient advised that she has been given clearance to hold Plavix 5 days prior to EGD scheduled for 03-05-22.  Patient advised to take last dose of Plavix on 02-27-22, and she will be advised when to restart Plavix by Dr Loletha Carrow after the procedure.  Patient agreed to plan and verbalized understanding.  No further questions.

## 2022-02-22 NOTE — Telephone Encounter (Signed)
Left message to return call 

## 2022-02-26 ENCOUNTER — Telehealth: Payer: Self-pay | Admitting: Family Medicine

## 2022-02-26 ENCOUNTER — Ambulatory Visit (INDEPENDENT_AMBULATORY_CARE_PROVIDER_SITE_OTHER): Payer: Medicare Other | Admitting: Family Medicine

## 2022-02-26 ENCOUNTER — Other Ambulatory Visit: Payer: Self-pay | Admitting: Radiology

## 2022-02-26 ENCOUNTER — Encounter: Payer: Self-pay | Admitting: Family Medicine

## 2022-02-26 ENCOUNTER — Telehealth (HOSPITAL_COMMUNITY): Payer: Self-pay

## 2022-02-26 VITALS — BP 112/78 | HR 68 | Temp 97.9°F | Wt 149.4 lb

## 2022-02-26 DIAGNOSIS — C4490 Unspecified malignant neoplasm of skin, unspecified: Secondary | ICD-10-CM

## 2022-02-26 DIAGNOSIS — I671 Cerebral aneurysm, nonruptured: Secondary | ICD-10-CM

## 2022-02-26 MED ORDER — CLOPIDOGREL BISULFATE 75 MG PO TABS: 75.0000 mg | ORAL_TABLET | Freq: Every day | ORAL | 5 refills | Status: DC

## 2022-02-26 NOTE — Telephone Encounter (Signed)
Pt called to get a refill of her Plavix. I have sent a message to the PA to call this in. I informed her that I would call her later this month to get her scheduled for her f/u imaging. AW

## 2022-02-26 NOTE — Progress Notes (Signed)
   Subjective:    Patient ID: Elizabeth Phelps, female    DOB: [birth year], 66 y.o.   MRN: [id number]  HPI She is here for consult concerning skin lesions.  She has 1 on the right side of her nose as well as 1 on her mid chest area and right forearm.  All of these have been noted within the last several weeks.   Review of Systems     Objective:   Physical Exam Exam of the right nose does show a 1 centimeters slightly raised dry scaly lesion.  She also has a lesion on her mid chest that is 1-1/2 cm raised with a central scab and pearly borders.  The one on her arm is really roughly the same size and shape.       Assessment & Plan:  Skin cancer - Plan: Ambulatory referral to Dermatology I explained that this is probably a skin cancer most likely squamous cell.  She apparently has an appointment but is scheduled at the end of October.  We will see if we can get her in much sooner to get this thing squared away.  Explained the fact that especially the one present on her nose might require more extensive treatment.

## 2022-02-26 NOTE — Telephone Encounter (Signed)
See if you can get her in sooner

## 2022-02-27 ENCOUNTER — Encounter: Payer: Self-pay | Admitting: Internal Medicine

## 2022-02-28 ENCOUNTER — Telehealth: Payer: Self-pay

## 2022-02-28 NOTE — Telephone Encounter (Signed)
Pt. Called stating she saw you recently and you found 3 different places on her body that looked like skin cancer. A referral was put in to [facility], but they can not get her in until 04/22/22. She wanted to know if there was a different derm office that may be able to get her in sooner. She doesn't want to wait since it is skin cancer.

## 2022-03-05 ENCOUNTER — Encounter: Payer: Self-pay | Admitting: Gastroenterology

## 2022-03-05 ENCOUNTER — Ambulatory Visit (AMBULATORY_SURGERY_CENTER): Payer: Medicare Other | Admitting: Gastroenterology

## 2022-03-05 VITALS — BP 111/69 | HR 80 | Temp 98.0°F | Resp 10 | Ht 63.0 in | Wt 146.0 lb

## 2022-03-05 DIAGNOSIS — K317 Polyp of stomach and duodenum: Secondary | ICD-10-CM

## 2022-03-05 DIAGNOSIS — K7460 Unspecified cirrhosis of liver: Secondary | ICD-10-CM | POA: Diagnosis not present

## 2022-03-05 DIAGNOSIS — K2950 Unspecified chronic gastritis without bleeding: Secondary | ICD-10-CM | POA: Diagnosis not present

## 2022-03-05 MED ORDER — SODIUM CHLORIDE 0.9 % IV SOLN: 500.0000 mL | Freq: Once | INTRAVENOUS | Status: DC

## 2022-03-05 MED ORDER — OMEPRAZOLE 20 MG PO CPDR: 20.0000 mg | DELAYED_RELEASE_CAPSULE | ORAL | 3 refills | Status: DC

## 2022-03-05 NOTE — Progress Notes (Signed)
A and O x3. Report to RN. Tolerated MAC anesthesia well.Teeth unchanged after procedure. 

## 2022-03-05 NOTE — Progress Notes (Signed)
Called to room to assist during endoscopic procedure.  Patient ID and intended procedure confirmed with present staff. Received instructions for my participation in the procedure from the performing physician.  

## 2022-03-05 NOTE — Patient Instructions (Addendum)
Resume previous diet and continue present medications  Resume Plavix (clopidogrel) at prior dose tomorrow 03/06/22 Repeat upper endoscopy in 3 years for screening purposes Prilosec (omeprazole) 20 mg every other day indefinitely - pick up prescription from [place]:   Refer to the procedure report that was given to you for any specific questions about what was found during the examination.  If the procedure report does not answer your questions, please call your gastroenterologist to clarify.  If you requested that your care partner not be given the details of your procedure findings, then the procedure report has been included in a sealed envelope for you to review at your convenience later.  YOU SHOULD EXPECT: Some feelings of bloating in the abdomen. Passage of more gas than usual.  Walking can help get rid of the air that was put into your GI tract during the procedure and reduce the bloating. If you had a lower endoscopy (such as a colonoscopy or flexible sigmoidoscopy) you may notice spotting of blood in your stool or on the toilet paper. If you underwent a bowel prep for your procedure, you may not have a normal bowel movement for a few days.  Please Note:  You might notice some irritation and congestion in your nose or some drainage.  This is from the oxygen used during your procedure.  There is no need for concern and it should clear up in a day or so.  SYMPTOMS TO REPORT IMMEDIATELY:  Following upper endoscopy (EGD)  Vomiting of blood or coffee ground material  New chest pain or pain under the shoulder blades  Painful or persistently difficult swallowing  New shortness of breath  Fever of 100F or higher  Black, tarry-looking stools  For urgent or emergent issues, a gastroenterologist can be reached at any hour by calling [phone]. Do not use MyChart messaging for urgent concerns.    DIET:  We do  recommend a small meal at first, but then you may proceed to your regular diet.  Drink plenty of fluids but you should avoid alcoholic beverages for 24 hours.  ACTIVITY:  You should plan to take it easy for the rest of today and you should NOT DRIVE or use heavy machinery until tomorrow (because of the sedation medicines used during the test).    FOLLOW UP: Our staff will call the number listed on your records the next business day following your procedure.  We will call around 7:15- 8:00 am to check on you and address any questions or concerns that you may have regarding the information given to you following your procedure. If we do not reach you, we will leave a message.     If any biopsies were taken you will be contacted by phone or by letter within the next 1-3 weeks.  Please call us at [phone] if you have not heard about the biopsies in 3 weeks.    SIGNATURES/CONFIDENTIALITY: You and/or your care partner have signed paperwork which will be entered into your electronic medical record.  These signatures attest to the fact that that the information above on your After Visit Summary has been reviewed and is understood.  Full responsibility of the confidentiality of this discharge information lies with you and/or your care-partner.

## 2022-03-05 NOTE — Op Note (Signed)
[facility] Patient Name: Elizabeth Phelps Procedure Date: 03/05/2022 2:18 PM MRN: [id number] Endoscopist: Mallie Mussel L. Loletha Carrow , MD Age: 66 Referring MD:  Date of Birth: [birth year] Gender: Female Account #: [id number] Procedure:                Upper GI endoscopy Indications:              Cirrhosis rule out esophageal varices                           PBC cirrhosis. low MELD score, normal platelets Medicines:                Monitored Anesthesia Care Procedure:                Pre-Anesthesia Assessment:                           - Prior to the procedure, a History and Physical                            was performed, and patient medications and                            allergies were reviewed. The patient's tolerance of                            previous anesthesia was also reviewed. The risks                            and benefits of the procedure and the sedation                            options and risks were discussed with the patient.                            All questions were answered, and informed consent                            was obtained. Prior Anticoagulants: The patient has                            taken Plavix (clopidogrel), last dose was 5 days                            prior to procedure. ASA Grade Assessment: II - A                            patient with mild systemic disease. After reviewing                            the risks and benefits, the patient was deemed in                            satisfactory condition to undergo the procedure.  After obtaining informed consent, the endoscope was                            passed under direct vision. Throughout the                            procedure, the patient's blood pressure, pulse, and                            oxygen saturations were monitored continuously. The                            Endoscope was introduced through the mouth, and                            advanced  to the second part of duodenum. The upper                            GI endoscopy was accomplished without difficulty.                            The patient tolerated the procedure well. Scope In: Scope Out: Findings:                 There is no endoscopic evidence of varices in the                            entire esophagus.                           Patchy mild mucosal changes characterized by                            congestion and erythema were found in the gastric                            body and in the gastric antrum. Several biopsies                            were obtained in the gastric body and in the                            gastric antrum with cold forceps for histology.                           The exam of the stomach was otherwise normal.                           The cardia and gastric fundus were normal on                            retroflexion.                           Patchy mild mucosal  changes characterized by                            erythema and erosion were found in the duodenal                            bulb and in the first portion of the duodenum. Complications:            No immediate complications. Estimated Blood Loss:     Estimated blood loss was minimal. Impression:               - Congested and erythematous mucosa in the gastric                            body and antrum.                           - Mucosal changes in the duodenum.                           - Several biopsies were obtained in the gastric                            body and in the gastric antrum.                           If gastric biopsies negative for H pylori, then the                            mucosal changes are likely mild portal                            gastropathy/duodenopathy or aspirin effect. Recommendation:           - Patient has a contact number available for                            emergencies. The signs and symptoms of potential                             delayed complications were discussed with the                            patient. Return to normal activities tomorrow.                            Written discharge instructions were provided to the                            patient.                           - Resume previous diet.                           - Resume Plavix (clopidogrel) at prior dose  tomorrow.                           - Await pathology results.                           - Use Prilosec (omeprazole) 20 mg PO every other                            day indefinitely.                           - Repeat upper endoscopy in 3 years for screening                            purposes. Cambry Spampinato L. Loletha Carrow, MD 03/05/2022 2:46:15 PM This report has been signed electronically.

## 2022-03-05 NOTE — Progress Notes (Signed)
No changes to clinical history since GI office visit on 02/12/22.  The patient is appropriate for an endoscopic procedure in the ambulatory setting.  - Wilfrid Lund, MD

## 2022-03-06 ENCOUNTER — Telehealth: Payer: Self-pay

## 2022-03-06 ENCOUNTER — Encounter: Payer: Medicare Other | Admitting: Psychology

## 2022-03-06 NOTE — Telephone Encounter (Signed)
  Follow up Call-     03/05/2022    1:01 PM  Call back number  Post procedure Call Back phone  # [phone]  Permission to leave phone message Yes     Post op call attempted, no answer, left WM.

## 2022-03-08 NOTE — Progress Notes (Signed)
This encounter was created in error - please disregard.

## 2022-03-12 ENCOUNTER — Encounter: Payer: Self-pay | Admitting: Gastroenterology

## 2022-03-15 ENCOUNTER — Telehealth: Payer: Self-pay | Admitting: Licensed Clinical Social Worker

## 2022-03-15 ENCOUNTER — Ambulatory Visit (INDEPENDENT_AMBULATORY_CARE_PROVIDER_SITE_OTHER): Payer: Medicare Other | Admitting: Psychology

## 2022-03-15 DIAGNOSIS — F331 Major depressive disorder, recurrent, moderate: Secondary | ICD-10-CM

## 2022-03-15 DIAGNOSIS — F411 Generalized anxiety disorder: Secondary | ICD-10-CM | POA: Diagnosis not present

## 2022-03-15 NOTE — Patient Outreach (Signed)
  Care Coordination   Initial Visit Note   03/15/2022 Name: Elizabeth Phelps MRN: [id number] DOB: 08-21-1955  Elizabeth Phelps is a 66 y.o. year old female who sees Denita Lung, MD for primary care. I spoke with  [name] by phone today.  What matters to the patients health and wellness today?  Care Coordination    Goals Addressed             This Visit's Progress    Care Coordination Activities-No Follow Up Required       Care Coordination Interventions: Active listening / Reflection utilized  Emotional Support Provided Verbalization of feelings encouraged  LCSW informed patient of care coordination services. Pt is not interested at this time and agreed to contact PCP, should needs arise Patient identified strong support system LCSW reviewed upcoming appts        SDOH assessments and interventions completed:  No     Care Coordination Interventions Activated:  Yes  Care Coordination Interventions:  Yes, provided   Follow up plan: No further intervention required.   Encounter Outcome:  Pt. Refused   Christa See, MSW, [email or web address] Phone [phone] 4:09 PM

## 2022-03-15 NOTE — Patient Instructions (Signed)
Visit Information  Thank you for taking time to visit with me today. Please don't hesitate to contact me if I can be of assistance to you.   Following are the goals we discussed today:   Goals Addressed             This Visit's Progress    COMPLETED: Care Coordination Activities-No Follow Up Required       Care Coordination Interventions: Active listening / Reflection utilized  Emotional Support Provided Verbalization of feelings encouraged  LCSW informed patient of care coordination services. Pt is not interested at this time and agreed to contact PCP, should needs arise Patient identified strong support system. She is participating in therapy  LCSW reviewed upcoming appts        If you are experiencing a Mental Health or [place] or need someone to talk to, please call the Suicide and Crisis Lifeline: 988 call 911   Patient verbalizes understanding of instructions and care plan provided today and agrees to view in [place]. Active MyChart status and patient understanding of how to access instructions and care plan via MyChart confirmed with patient.     No further follow up required:    Christa See, MSW, [email or web address] Phone [phone] 4:17 PM

## 2022-03-15 NOTE — Progress Notes (Signed)
[facility] Counselor/Therapist Progress Note  Patient ID: Elizabeth Phelps, MRN: [id number]   Date: 03/15/22  Time Spent: 10:37 am - 11:29 pm : 52 Minutes  Treatment Type: Individual Therapy.  Reported Symptoms: Anxiety, depression, and inattention.   Mental Status Exam: Appearance:  Neat     Behavior: Appropriate  Motor: Normal  Speech/Language:  Clear and Coherent  Affect: Flat  Mood: anxious  Thought process: normal  Thought content:   WNL  Sensory/Perceptual disturbances:   WNL  Orientation: oriented to person, place, time/date, and situation  Attention: Good  Concentration: Good  Memory: WNL  Fund of knowledge:  Good  Insight:   Good  Judgment:  Good  Impulse Control: Good   Risk Assessment: Danger to Self:  No Self-injurious Behavior: No Danger to Others: No Duty to Warn:no Physical Aggression / Violence:No  Access to Firearms a concern: No  Gang Involvement:No   Subjective:   [name] participated from home, via video and consented to treatment. Therapist participated from home office. We met online due to [place] pandemic. Danaria reviewed the events of the past week. She noted feeling good and receiving positive news from her doctor after a procedure. She noted working on shifting her focus to more positive things.  She noted her interest in discussing concerns with her son Broadus John.  We reflected on her past attempts to communicate concerns.  We worked on identifying ways to improve communication and to employ the use of empathy, assertiveness, and iMessages.  Therapist modeled this during the session.  Therapist encouraged Amparo to ask further questions, manage her frustration during the conversation, and reflect back her son's communication.  We worked on identifying ways to address concerns long-term and to set stage IV healthy conflict resolution.  Marketia was engaged and motivated during the session.  She expressed commitment towards her goals.   Therapist praised Quilla and provided supportive therapy.  Follow-up was scheduled for continued treatment.  Interventions: interpersonal    Diagnosis:  Generalized anxiety disorder  Moderate episode of recurrent major depressive disorder [facility])   Treatment Plan:  Client Abilities/Strengths Aleaha is intelligent, forthcoming, and motivated for change.   Support System: Family and friends.  Client Treatment Preferences OPT  Client Statement of Needs Valencia would like to manage her anxiety,  learning about anxiety and depression, processing past events and relationships, increase self-care.   Treatment Level Weekly  Symptoms  Anxiety: Rumination, feeling anxious, difficulty managing worry, worrying about different things, trouble relaxing, restlessness, irritability, feeling something awful might happen.    (Status: maintained)  Depression: loss of interest, feeling down, fluctuating sleep, increased appetite & weight-gain, feeling bad about self, trouble concentrating, psycho-motor, low motivation,    (Status: maintained)  Goals:   c experiences symptoms of anxiety, depression, and inattention.    Target Date: 11/06/22 Frequency: Weekly  Progress: 0 Modality: individual    Therapist will provide referrals for additional resources as appropriate.  Therapist will provide psycho-education regarding Victorya's diagnosis and corresponding treatment approaches and interventions. Licensed Clinical Social Worker, [place], LCSW will support the patient's ability to achieve the goals identified. will employ CBT, BA, Problem-solving, Solution Focused, Mindfulness,  coping skills, & other evidenced-based practices will be used to promote progress towards healthy functioning to help manage decrease symptoms associated with her diagnosis.   Reduce overall level, frequency, and intensity of the feelings of depression, anxiety as evidenced by decreased overall symptoms from 6 to 7 days/week  to 0 to 1 days/week  per client report for at least 3 consecutive months. Verbally express understanding of the relationship between feelings of depression, anxiety and their impact on thinking patterns and behaviors. Verbalize an understanding of the role that distorted thinking plays in creating fears, excessive worry, and ruminations.   [name] participated in the creation of the treatment plan)   [name], LCSW

## 2022-03-20 ENCOUNTER — Ambulatory Visit (INDEPENDENT_AMBULATORY_CARE_PROVIDER_SITE_OTHER): Payer: Medicare Other | Admitting: Psychology

## 2022-03-20 DIAGNOSIS — F411 Generalized anxiety disorder: Secondary | ICD-10-CM | POA: Diagnosis not present

## 2022-03-20 DIAGNOSIS — F331 Major depressive disorder, recurrent, moderate: Secondary | ICD-10-CM | POA: Diagnosis not present

## 2022-03-20 NOTE — Progress Notes (Signed)
[facility] Counselor/Therapist Progress Note  Patient ID: Elizabeth Phelps, MRN: [id number]   Date: 03/20/22  Time Spent: 1:32 pm - 2:22 pm : 50 Minutes  Treatment Type: Individual Therapy.  Reported Symptoms: Anxiety, depression, and inattention.   Mental Status Exam: Appearance:  Neat     Behavior: Appropriate  Motor: Normal  Speech/Language:  Clear and Coherent  Affect: Flat  Mood: anxious  Thought process: normal  Thought content:   WNL  Sensory/Perceptual disturbances:   WNL  Orientation: oriented to person, place, time/date, and situation  Attention: Good  Concentration: Good  Memory: WNL  Fund of knowledge:  Good  Insight:   Good  Judgment:  Good  Impulse Control: Good   Risk Assessment: Danger to Self:  No Self-injurious Behavior: No Danger to Others: No Duty to Warn:no Physical Aggression / Violence:No  Access to Firearms a concern: No  Gang Involvement:No   Subjective:   Elizabeth Phelps participated from home, via video and consented to treatment. Therapist participated from home office. We met online due to Elizabeth Phelps pandemic. Elizabeth Phelps reviewed the events of the past week. Elizabeth Phelps noted feeling disappointed in self for not contacting her son. She noted worry that she would be "shot down", which is a barrier to discuss concerns. She noted struggling with the decision to follow up with him. She noted not receiving support from her ex regarding their son, Elizabeth Phelps. She noted the risk of not receiving a response from her son. We explored her worry and how she deals with this.  Additional stressors include having cancer on her arm that needs to be addressed. She noted the start of this strain with her, her son, his partner, and past partner. We explored this during the session. We worked on delineating her responsibility on interpersonal communication vs. Others. We discussed ways to communicate clearly and positively. We explored her self-talk during the session.  Elizabeth Phelps was engaged and motivated during the session. She expressed commitment towards our goals. Therapist praised Elizabeth Phelps and provided supportive therapy.   Interventions: interpersonal & cbt  Diagnosis:  Generalized anxiety disorder  Moderate episode of recurrent major depressive disorder [facility])   Treatment Plan:  Client Abilities/Strengths Elizabeth Phelps is intelligent, forthcoming, and motivated for change.   Support System: Family and friends.  Client Treatment Preferences OPT  Client Statement of Needs Elizabeth Phelps would like to manage her anxiety,  learning about anxiety and depression, processing past events and relationships, increase self-care.   Treatment Level Weekly  Symptoms  Anxiety: Rumination, feeling anxious, difficulty managing worry, worrying about different things, trouble relaxing, restlessness, irritability, feeling something awful might happen.    (Status: maintained)  Depression: loss of interest, feeling down, fluctuating sleep, increased appetite & weight-gain, feeling bad about self, trouble concentrating, psycho-motor, low motivation,    (Status: maintained)  Goals:   c experiences symptoms of anxiety, depression, and inattention.    Target Date: 11/06/22 Frequency: Weekly  Progress: 0 Modality: individual    Therapist will provide referrals for additional resources as appropriate.  Therapist will provide psycho-education regarding Elizabeth Phelps's diagnosis and corresponding treatment approaches and interventions. Licensed Clinical Social Worker, [place], LCSW will support the patient's ability to achieve the goals identified. will employ CBT, BA, Problem-solving, Solution Focused, Mindfulness,  coping skills, & other evidenced-based practices will be used to promote progress towards healthy functioning to help manage decrease symptoms associated with her diagnosis.   Reduce overall level, frequency, and intensity of the feelings of depression, anxiety as evidenced  by decreased overall symptoms from 6 to 7 days/week to 0 to 1 days/week per client report for at least 3 consecutive months. Verbally express understanding of the relationship between feelings of depression, anxiety and their impact on thinking patterns and behaviors. Verbalize an understanding of the role that distorted thinking plays in creating fears, excessive worry, and ruminations.   Elizabeth Phelps participated in the creation of the treatment plan)   [name], LCSW

## 2022-03-29 DIAGNOSIS — C44622 Squamous cell carcinoma of skin of right upper limb, including shoulder: Secondary | ICD-10-CM | POA: Diagnosis not present

## 2022-03-29 HISTORY — PX: SKIN BIOPSY: SHX1

## 2022-04-02 ENCOUNTER — Encounter: Payer: Self-pay | Admitting: Internal Medicine

## 2022-04-04 ENCOUNTER — Ambulatory Visit
Admission: RE | Admit: 2022-04-04 | Discharge: 2022-04-04 | Disposition: A | Discharge status: Home / Self Care | Payer: Medicare Other | Source: Ambulatory Visit | Attending: Family Medicine | Admitting: Family Medicine

## 2022-04-04 DIAGNOSIS — Z780 Asymptomatic menopausal state: Secondary | ICD-10-CM | POA: Diagnosis not present

## 2022-04-05 ENCOUNTER — Ambulatory Visit: Payer: Medicare Other | Admitting: Psychology

## 2022-04-09 ENCOUNTER — Ambulatory Visit (INDEPENDENT_AMBULATORY_CARE_PROVIDER_SITE_OTHER): Payer: Medicare Other | Admitting: Psychology

## 2022-04-09 DIAGNOSIS — F331 Major depressive disorder, recurrent, moderate: Secondary | ICD-10-CM | POA: Diagnosis not present

## 2022-04-09 DIAGNOSIS — F411 Generalized anxiety disorder: Secondary | ICD-10-CM

## 2022-04-09 NOTE — Progress Notes (Signed)
[facility] Counselor/Therapist Progress Note  Patient ID: Elizabeth Phelps, MRN: [id number]   Date: 04/09/22  Time Spent: 11:06 am - 11:54 am : 48 Minutes  Treatment Type: Individual Therapy.  Reported Symptoms: Anxiety, depression, and inattention.   Mental Status Exam: Appearance:  Neat     Behavior: Appropriate  Motor: Normal  Speech/Language:  Clear and Coherent  Affect: Flat  Mood: anxious  Thought process: normal  Thought content:   WNL  Sensory/Perceptual disturbances:   WNL  Orientation: oriented to person, place, time/date, and situation  Attention: Good  Concentration: Good  Memory: WNL  Fund of knowledge:  Good  Insight:   Good  Judgment:  Good  Impulse Control: Good   Risk Assessment: Danger to Self:  No Self-injurious Behavior: No Danger to Others: No Duty to Warn:no Physical Aggression / Violence:No  Access to Firearms a concern: No  Gang Involvement:No   Subjective:   Elizabeth Phelps participated from home, via video and consented to treatment. Therapist participated from office. We met online due to [place] pandemic. Elizabeth Phelps reviewed the events of the past week. She noted having a recent conversation with her son, Elizabeth Phelps, due to familial strife. She noted her son noted "not approving" of her relationship with Elizabeth Phelps. She noted being told that Elizabeth Phelps "hated" her.  We explored this during the session along with their overall relationship. We discussed her son's demand for an apology to Elizabeth Phelps. We explored this during the session, the effect of the conversation on her mood, and worked on identifying her own needs for reconciliation. We discussed ways to communicate concerns, give feedback, and address stressors proactively. Therapist praised Elizabeth Phelps for her efforts to be proactive with her stressors and to resolve conflict. Therapist encouraged Elizabeth Phelps to identify what she needs for the relationship to progress. A follow-up was scheduled for continued treatment.  Therapist provided supportive therapy.   Interventions: interpersonal & cbt  Diagnosis:  Generalized anxiety disorder  Moderate episode of recurrent major depressive disorder [facility])   Treatment Plan:  Client Abilities/Strengths Elizabeth Phelps is intelligent, forthcoming, and motivated for change.   Support System: Family and friends.  Client Treatment Preferences OPT  Client Statement of Needs Elizabeth Phelps would like to manage her anxiety,  learning about anxiety and depression, processing past events and relationships, increase self-care.   Treatment Level Weekly  Symptoms  Anxiety: Rumination, feeling anxious, difficulty managing worry, worrying about different things, trouble relaxing, restlessness, irritability, feeling something awful might happen.    (Status: maintained)  Depression: loss of interest, feeling down, fluctuating sleep, increased appetite & weight-gain, feeling bad about self, trouble concentrating, psycho-motor, low motivation,    (Status: maintained)  Goals:   c experiences symptoms of anxiety, depression, and inattention.    Target Date: 11/06/22 Frequency: Weekly  Progress: 0 Modality: individual    Therapist will provide referrals for additional resources as appropriate.  Therapist will provide psycho-education regarding Elizabeth Phelps's diagnosis and corresponding treatment approaches and interventions. Licensed Clinical Social Worker, [place], Elizabeth Phelps will support the patient's ability to achieve the goals identified. will employ CBT, BA, Problem-solving, Solution Focused, Mindfulness,  coping skills, & other evidenced-based practices will be used to promote progress towards healthy functioning to help manage decrease symptoms associated with her diagnosis.   Reduce overall level, frequency, and intensity of the feelings of depression, anxiety as evidenced by decreased overall symptoms from 6 to 7 days/week to 0 to 1 days/week per client report for at least 3  consecutive months. Verbally express  understanding of the relationship between feelings of depression, anxiety and their impact on thinking patterns and behaviors. Verbalize an understanding of the role that distorted thinking plays in creating fears, excessive worry, and ruminations.   Elizabeth Phelps participated in the creation of the treatment plan)   [name], Elizabeth Phelps

## 2022-04-15 ENCOUNTER — Encounter: Payer: Self-pay | Admitting: Internal Medicine

## 2022-04-15 ENCOUNTER — Telehealth (INDEPENDENT_AMBULATORY_CARE_PROVIDER_SITE_OTHER): Payer: Medicare Other | Admitting: Family Medicine

## 2022-04-15 ENCOUNTER — Encounter: Payer: Self-pay | Admitting: Family Medicine

## 2022-04-15 VITALS — Ht 63.5 in | Wt 147.0 lb

## 2022-04-15 DIAGNOSIS — C4490 Unspecified malignant neoplasm of skin, unspecified: Secondary | ICD-10-CM

## 2022-04-15 DIAGNOSIS — I671 Cerebral aneurysm, nonruptured: Secondary | ICD-10-CM | POA: Diagnosis not present

## 2022-04-15 DIAGNOSIS — Z0000 Encounter for general adult medical examination without abnormal findings: Secondary | ICD-10-CM | POA: Diagnosis not present

## 2022-04-15 DIAGNOSIS — K754 Autoimmune hepatitis: Secondary | ICD-10-CM

## 2022-04-15 NOTE — Progress Notes (Signed)
Documentation for virtual audio and video telecommunications through [place] encounter: The patient was located at home. 2 patient identifiers used.  The provider was located in the office. The patient did consent to this visit and is aware of possible charges through their insurance for this visit. The other persons participating in this telemedicine service were none. Time spent on call was 5 minutes and in review of previous records >25 minutes total for counseling and coordination of care. This virtual service is not related to other E/M service within previous 7 days.  Elizabeth Phelps is a 66 y.o. female who presents for annual wellness visit and follow-up on chronic medical conditions.  She has does have a history of biliary cholangitis and is being cared for by gastroenterology.  She has also had 2 cerebral stents placed for her aneurysm and seems to be doing well with that.  She does plan to get a flu shot.  Does not want to get a COVID shot.  She will schedule a mammogram.  She is also scheduled to follow-up with neurology concerning the cerebral aneurysms.  She recently had a skin lesion removed which was a squamous cell carcinoma.  Immunizations and Health Maintenance Immunization History  Administered Date(s) Administered   Fluad Quad(high Dose 65+) 07/16/2021   Hepb-cpg 06/11/2018, 07/14/2018   Influenza,inj,Quad PF,6+ Mos 06/10/2019   PFIZER(Purple Top)SARS-COV-2 Vaccination 01/26/2020, 02/17/2020   Tdap 07/27/2015   Health Maintenance Due  Topic Date Due   Zoster Vaccines- Shingrix (1 of 2) Never done   INFLUENZA VACCINE  01/22/2022    Last Pap smear: 1 year over due Last mammogram: 2022 Last colonoscopy: 2019 Last DEXA: 2023 Dentist: looking for one Ophtho: looking for one Exercise: doing laundry   Other doctors caring for patient include:Kidney doctor, [facility] for knee cap, neuro Dr. Delice Lesch  Advanced directives: Does Patient Have a Medical Advance  Directive?: No Would patient like information on creating a medical advance directive?: No - Patient declined  Depression screen:  See questionnaire below.     04/15/2022    1:08 PM 12/14/2021   11:16 AM 10/15/2021   10:51 AM 12/18/2020    2:33 PM 09/07/2020   10:12 AM  Depression screen PHQ 2/9  Decreased Interest 0 2 0 2   Down, Depressed, Hopeless 0 3 0 0   PHQ - 2 Score 0 5 0 2   Altered sleeping  2  1   Tired, decreased energy  0  3   Change in appetite  0  0   Feeling bad or failure about yourself   1  1   Trouble concentrating  0  0   Moving slowly or fidgety/restless  0  3   Suicidal thoughts  0  0   PHQ-9 Score  8  10   Difficult doing work/chores  Somewhat difficult  Somewhat difficult      Information is confidential and restricted. Go to Review Flowsheets to unlock data.    Fall Risk Screen: see questionnaire below.    04/15/2022    1:07 PM 12/14/2021   11:13 AM 10/15/2021   10:48 AM 07/16/2021    2:23 PM 12/12/2020   10:09 AM  Fall Risk   Falls in the past year? '1 1 1 1 1  '$ Comment  trips     Number falls in past yr: '1 1 1 1 1  '$ Injury with Fall? 1 1 0 0 1  Comment dislocated knee cap in May  Risk for fall due to : Other (Comment) Impaired balance/gait;Medication side effect;Impaired mobility Impaired mobility    Follow up Falls evaluation completed Falls evaluation completed;Education provided;Falls prevention discussed Falls evaluation completed      ADL screen:  See questionnaire below Functional Status Survey: Is the patient deaf or have difficulty hearing?: Yes (sometimes) Does the patient have difficulty seeing, even when wearing glasses/contacts?: Yes (seeing eye doctor soon) Does the patient have difficulty concentrating, remembering, or making decisions?: Yes Does the patient have difficulty walking or climbing stairs?: No Does the patient have difficulty dressing or bathing?: No Does the patient have difficulty doing errands alone such as visiting  a doctor's office or shopping?: No   Review of Systems Constitutional: -, -unexpected weight change, -anorexia, -fatigue Allergy: -sneezing, -itching, -congestion Dermatology: denies changing moles, rash, lumps ENT: -runny nose, -ear pain, -sore throat,  Cardiology:  -chest pain, -palpitations, -orthopnea, Respiratory: -cough, -shortness of breath, -dyspnea on exertion, -wheezing,  Gastroenterology: -abdominal pain, -nausea, -vomiting, -diarrhea, -constipation, -dysphagia Hematology: -bleeding or bruising problems Musculoskeletal: -arthralgias, -myalgias, -joint swelling, -back pain, - Ophthalmology: -vision changes,  Urology: -dysuria, -difficulty urinating,  -urinary frequency, -urgency, incontinence Neurology: -, -numbness, , -memory loss, -falls, -dizziness    PHYSICAL EXAM:  Ht 5' 3.5" (1.613 m)   Wt 147 lb (66.7 kg)   BMI 25.63 kg/m  Alert and in no distress otherwise not examined  ASSESSMENT/PLAN: Encounter for Medicare annual wellness exam  Skin cancer  Autoimmune hepatitis  Cerebral aneurysm without rupture   Recommend that she get the flu shot.  Patient not interested in getting COVID-vaccine.  Also recommend she get pneumonia shot either here or at the store. Discussed  yearly mammograms; Immunization recommendations discussed.  Colonoscopy recommendations reviewed   Medicare Attestation I have personally reviewed: The patient's medical and social history Their use of alcohol, tobacco or illicit drugs Their current medications and supplements The patient's functional ability including ADLs,fall risks, home safety risks, cognitive, and hearing and visual impairment Diet and physical activities Evidence for depression or mood disorders  The patient's weight, height, and BMI have been recorded in the chart.  I have made referrals, counseling, and provided education to the patient based on review of the above and I have provided the patient with a written  personalized care plan for preventive services.     Jill Alexanders, MD   04/15/2022

## 2022-04-17 ENCOUNTER — Encounter: Payer: Self-pay | Admitting: Neurology

## 2022-04-17 ENCOUNTER — Ambulatory Visit (INDEPENDENT_AMBULATORY_CARE_PROVIDER_SITE_OTHER): Payer: Medicare Other | Admitting: Neurology

## 2022-04-17 VITALS — BP 103/71 | HR 88 | Ht 63.5 in | Wt 149.0 lb

## 2022-04-17 DIAGNOSIS — G43109 Migraine with aura, not intractable, without status migrainosus: Secondary | ICD-10-CM

## 2022-04-17 DIAGNOSIS — R400 Somnolence: Secondary | ICD-10-CM | POA: Diagnosis not present

## 2022-04-17 NOTE — Progress Notes (Signed)
NEUROLOGY FOLLOW UP OFFICE NOTE  Elizabeth Phelps [id number] [birth year]  HISTORY OF PRESENT ILLNESS: I had the pleasure of seeing Elizabeth Phelps in follow-up in the neurology clinic on 04/17/2022.  The patient was last seen 9 months ago. She was seen initially for memory loss. Repeat Neuropsychological evaluation in 11/2020 showed  isolated deficits across verbal fluency and fine motor coordination/speed using her right hand. There was no significant decline from 2021 testing, and performances were a whole less variable, with improvements across several tasks. Most likely cause for subjective cognitive dysfunction continues to surround underlying traits for ADHD, exacerbated by significant psychiatric distress and disrupted sleep. She sees Dr. Estanislado Pandy for right MCA aneurysm s/p endovascular treatment in 01/2021. Most recent brain imaging from 09/2021 no acute changes with sequelae of prior right pterional craniotomy with underlying encephalomalacia, chronic microvascular disease, Chiari 1 malformation, sequelae of prior pipeline stent placement with small amount of residual curvilinear filling of the dominant saccular portion of the aneurysm sac.  She is doing much better on today's visit, she feels pretty good. She is visibly less anxious. She has been seeing a therapist with good results. Anxiety is still there, but she is feeling good. She reports waking up with a headache daily, it gets better after she has coffee. She has daytime drowsiness. She snores really bad, sometimes waking up from it. She keeps having brief sharp pains in the back of her head, lasting only for a second. She does not take any medication for these. She has Zomig but has not taken it this month, she does well with her migraines if she catches it early with 2 Tylenol and a cup of coffee. She has been contacted by Dr. Arlean Hopping office about doing another angiogram, but then has not heard back to schedule. She is on aspirin and  Plavix. She fell in June and injured her left knee.   History on Initial Assessment 09/08/2018: This is a pleasant 66 year old right-handed woman with a history of hyperlipidemia, tobacco use, anxiety, migraine with aura, right MCA aneurysm s/p craniotomy, presenting for evaluation of worsening memory. She started having memory issues after her brain surgery in 2002, however she continued to function quite well and states she has always been on top of things until around October 2019. At that time, she started noticing word-finding difficulties, [name] has noticed this as well, she would stop mid-sentence unable to remember a word. She would hear something but would not comprehend what people are saying, asking them to repeat it several times. She feels like her brain is "short-circuiting." She reports her long-term memory is "awesome" but she cannot remember what she ate yesterday. She has gotten lost driving and uses her GPS all the time even for familiar roads. 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 There were 3 days last week where she was driving to work then she suddenly realizes she is there but does not remember the trip. [name] was talking to her one time while patient was driving then realized she was at work, and did not notice any confusion. 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 She states numbers have always been her thing but now she cannot do math anymore. She lives with 2  roommates who were the first ones to notice how forgetful she has been. She has been told she is "off somewhere" or "zoning out" and would need to be called a couple of times to get her attention. She has noticed she gets frustrated and agitated more easily. She was anxious after her brain surgery in 2002  and was started on Paxil which helped, she does not feel it is helping much now.  She has also started having involuntary left hand shaking lasting a few minutes. Leg is unaffected. There is no associated speech difficulty, confusion, or weakness when she has the shaking. She has numbness and tingling in her finger tips. She has been having more headaches since October, with good response to Excedrin. She has dizziness when she gets up from bed in the morning or in the middle of the night, this is when she usually falls. Last fall was a week ago. She has noticed that if she looks at something sometimes, she sees something different, then looks again and sees what the object actually is. No olfactory/gustatory hallucinations. She has nausea when dizzy, but also has waves of nausea throughout the day. Sleep is fine most of the time. She has not woken up with tongue bite or incontinence. Her mother had memory issues after several strokes. Her father had a history of cerebral aneurysm as well. She had a head injury in the 1980s. No alcohol use. There is no family history of seizures.   MRI brain without contrast in January 2017 showed encephalomalacia of the right temporal lobe, right lateral lenticulostriate territory chronic infarct affecting the external capsule, periventricular white matter, patulous right temporal horn. Cerebellar tonsils are 45m below foramen magnum. MRA head showed combination fusiform and saccular aneurysm of the right MCA throughout M1 segment, appearing similar in size to angiogram in 2002.   Repeat MRI/MRA brain in 05/2019 showed no acute changes. There were post-op changes from right pterional craniotomy with underlying chronic post-operative and/or ischemic encephalomalacia in the right temporal lobe and right basal ganglia. There is a Chiari 1 malfomation with tonsils extending up to 121mthrough foramen magnum, stable from prior. There is diffuse atrophy and chronic microvascular  disease, mildly progressed from 2017. MRA showed combination of fusiform and saccular right MCA aneurysm originating from the right M1 segment, similar to 2017 imaging.   Neurocognitive testing was done in March 2021. There was performance variability as well as concerns surrounding validity of lower test scores. Deficits in visuoperceptual functioning are consistent with history of right temporal lobe encephalomalacia. There was no pronounced right frontal cognitive dysfunction seen. There was variability across processing speed, executive functioning, and visuospatial abilities, as well as learning and memory. She scored moderate range for acute symptoms of anxiety and depression. She also scored in the "likely to have ADHD" range across a childhood assessment of inattentive symptoms. It was noted that "There is the potential that the primary etiology of her cognitive deficits surrounds underlying traits of ADHD, exacerbated by significant psychiatric distress. This could manifest in trouble with processing speed and receptive language, worsening her ability to problem solve, adapt to changing cognitive demands, and learn novel information. However, I cannot rule out an underlying cognitive disorder at the present time."   EEG done 06/2019 showed occasional slowing over the right frontal and temporal regions.   Prior headache preventative medication: gabapentin Prior headache rescue medication: imitrex, zomig, Maxalt (needed 2 doses, drowsiness)  PAST MEDICAL HISTORY: Past Medical History:  Diagnosis Date  Anemia    Arnold-Chiari malformation, type I    Atherosclerosis of aorta 10/02/2017   Attention and concentration deficit 09/02/2019   traits of ADHD at very least   Autoimmune hepatitis 08/04/2018   Patient with PBC and autoimmune hepatitis overlap on previous biopsy.   Patient has a negative AMA, ANA was positive in a dual nuclear and speckled pattern with a titer of 1:1280, ASMA was weakly  positive at 27, and a mildly elevated IgG. Biopsy reported mild to moderate interface hepatitis.  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   Complication of anesthesia    hard to wake up after brain surgery in 2002   Diverticulosis 07/21/2020   Dysthymia 04/03/2012   Generalized anxiety disorder    History of colonic polyps 08/12/2016   Hyperlipidemia 08/12/2016   LDL goal <100   Incomplete right bundle branch block 09/22/2017   Major depressive disorder    Migraine headache    Primary biliary cholangitis 08/04/2018   Primary biliary cirrhosis    Smoker     MEDICATIONS: Current Outpatient Medications on File Prior to Visit  Medication Sig Dispense Refill   aspirin EC 81 MG tablet Take 81 mg by mouth daily. Swallow whole.     atorvastatin (LIPITOR) 40 MG tablet Take 1 tablet (40 mg total) by mouth daily. 90 tablet 3   azaTHIOprine (IMURAN) 50 MG tablet Take 100 mg by mouth at bedtime.     clopidogrel (PLAVIX) 75 MG tablet Take 1 tablet (75 mg total) by mouth daily. 30 tablet 5   ibuprofen (ADVIL) 200 MG tablet Take 400 mg by mouth every 6 (six) hours as needed for headache.     Multiple Vitamin (MULTIVITAMIN WITH MINERALS) TABS Take 1 tablet by mouth at bedtime.     OCALIVA 5 MG TABS Take 5 mg by mouth at bedtime.     omeprazole (PRILOSEC) 20 MG capsule Take 1 capsule (20 mg total) by mouth every other day. 90 capsule 3   PARoxetine (PAXIL) 20 MG tablet Take 1 tablet (20 mg total) by mouth daily. 90 tablet 1   zolmitriptan (ZOMIG) 5 MG tablet Take 1 tablet (5 mg total) by mouth as needed for migraine. 10 tablet 11   No current facility-administered medications on file prior to visit.    ALLERGIES: Allergies  Allergen Reactions   Codeine Nausea And Vomiting    FAMILY HISTORY: Family History  Problem Relation Age of Onset   Stroke Mother    Hypertension Mother    Prostate  cancer Father        mets   Aneurysm Father    Breast cancer Maternal Grandmother    Colon cancer Neg Hx    Stomach cancer Neg Hx    Esophageal cancer Neg Hx     SOCIAL HISTORY: Social History   Socioeconomic History   Marital status: Divorced    Spouse name: Not on file   Number of children: 2   Years of education: 12   Highest education level: High school graduate  Occupational History   Occupation: Unemployed    Comment: Prior customer service  Tobacco Use   Smoking status: Every Day    Packs/day: 0.50    Types: Cigarettes   Smokeless tobacco: Never  Vaping Use   Vaping Use: Never used  Substance and Sexual Activity   Alcohol use: No   Drug use:  Yes    Types: Marijuana    Comment: several times per week to help with anxiety   Sexual activity: Not Currently  Other Topics Concern   Not on file  Social History Narrative   Right handed   Highest level of edu- HS   Lives in one story home with two roommates   Social Determinants of Health   Financial Resource Strain: Medium Risk (12/14/2021)   Overall Financial Resource Strain (CARDIA)    Difficulty of Paying Living Expenses: Somewhat hard  Food Insecurity: Food Insecurity Present (12/17/2021)   Hunger Vital Sign    Worried About Running Out of Food in the Last Year: Sometimes true    Ran Out of Food in the Last Year: Sometimes true  Transportation Needs: No Transportation Needs (12/14/2021)   PRAPARE - Hydrologist (Medical): No    Lack of Transportation (Non-Medical): No  Physical Activity: Inactive (12/14/2021)   Exercise Vital Sign    Days of Exercise per Week: 0 days    Minutes of Exercise per Session: 0 min  Stress: Stress Concern Present (12/14/2021)   Desha    Feeling of Stress : To some extent  Social Connections: Not on file  Intimate Partner Violence: Not on file     PHYSICAL EXAM: Vitals:    04/17/22 1504  BP: 103/71  Pulse: 88  SpO2: 96%   General: No acute distress Head:  Normocephalic/atraumatic Skin/Extremities: No rash, no edema Neurological Exam: alert and awake. No aphasia or dysarthria. Fund of knowledge is appropriate.  Attention and concentration are normal.   Cranial nerves: Pupils equal, round. Extraocular movements intact with no nystagmus. Visual fields full.  No facial asymmetry.  Motor: Bulk and tone normal, muscle strength 5/5 throughout with no pronator drift.   Finger to nose testing intact.  Gait narrow-based and steady, no ataxia   IMPRESSION: This is a 66 yo RH woman with a history of  hyperlipidemia, tobacco use, anxiety, migraine with aura, right MCA aneurysm s/p craniotomy and recent endovascular treatment in 01/2021, who presented with memory changes and headaches. Repeat Neuropsychological evaluation did not show any evidence of a neurodegenerative process, subjective memory complaints likely due to underlying traits for ADHD, exacerbated by significant psychiatric distress and disrupted sleep. She is doing much better with anxiety and follows closely with Behavioral Health. She reprots daily headaches on awakening, daytime drowsiness, sleep study will be ordered to assess for OSA. She has prn Zomig for migraine rescue. Continue follow-up with Dr. Estanislado Pandy. Follow-up in 8 months, call for any changes.   Thank you for allowing me to participate in her care.  Please do not hesitate to call for any questions or concerns.    Ellouise Newer, M.D.   CC: Dr. Redmond School

## 2022-04-17 NOTE — Patient Instructions (Addendum)
Good to see you.  Schedule home sleep study  2. I will try contacting Dr. Arlean Hopping team as well about the angiogram  3. Continue follow-up with Behavioral Health  4. Follow-up in 8 months, call for any changes

## 2022-04-18 ENCOUNTER — Telehealth: Payer: Self-pay

## 2022-04-18 NOTE — Telephone Encounter (Signed)
No PA  is needed for Pt home sleep study reference number [id number]

## 2022-04-19 ENCOUNTER — Encounter: Payer: Medicare Other | Admitting: Psychology

## 2022-04-19 NOTE — Progress Notes (Unsigned)
° ° ° ° ° ° ° ° ° ° ° ° ° ° °  Elizabeth Mondor, LCSW °

## 2022-04-23 ENCOUNTER — Encounter: Payer: Self-pay | Admitting: Family Medicine

## 2022-05-01 ENCOUNTER — Ambulatory Visit (INDEPENDENT_AMBULATORY_CARE_PROVIDER_SITE_OTHER): Payer: Medicare Other | Admitting: Psychology

## 2022-05-01 DIAGNOSIS — F411 Generalized anxiety disorder: Secondary | ICD-10-CM | POA: Diagnosis not present

## 2022-05-01 DIAGNOSIS — F331 Major depressive disorder, recurrent, moderate: Secondary | ICD-10-CM

## 2022-05-01 NOTE — Progress Notes (Signed)
[facility] Counselor/Therapist Progress Note  Patient ID: Elizabeth Phelps, MRN: [id number]   Date: 05/01/22  Time Spent: 12:03 pm - 1:01 pm : 58 Minutes  Treatment Type: Individual Therapy.  Reported Symptoms: Anxiety, depression, and inattention.   Mental Status Exam: Appearance:  Neat     Behavior: Appropriate  Motor: Normal  Speech/Language:  Clear and Coherent  Affect: Flat  Mood: anxious  Thought process: normal  Thought content:   WNL  Sensory/Perceptual disturbances:   WNL  Orientation: oriented to person, place, time/date, and situation  Attention: Good  Concentration: Good  Memory: WNL  Fund of knowledge:  Good  Insight:   Good  Judgment:  Good  Impulse Control: Good   Risk Assessment: Danger to Self:  No Self-injurious Behavior: No Danger to Others: No Duty to Warn:no Physical Aggression / Violence:No  Access to Firearms a concern: No  Gang Involvement:No   Subjective:   Elizabeth Phelps participated from home, via video and consented to treatment. Therapist participated from office. We met online due to Elizabeth Phelps pandemic. Elizabeth Phelps reviewed the events of the past week. Elizabeth Phelps's ex-daughter in-law attended the session and Elizabeth Phelps provided verbal permission/consent for for her ex-daughter in-law to join. She noted continued conversation with her son, Elizabeth Phelps. She noted a lack of overall progress in the familial dynamics but noted increased communication. She noted wanting to speak with her daughter in-law but noted worry that she would react poorly. We explored this during the session and Elizabeth Phelps's ex-daughter in-law, Elizabeth Phelps, provided feedback regarding family dynamics and her worry about her own daughter, Elizabeth Phelps grand-daughter. We worked on [employer] feelings, ways to communicate positively and assertively and identifying areas of control and lack of control. We discussed the importance of boundary setting. Therapist modeled this during the session. Elizabeth Phelps  was engaged and motivated during the session. Elizabeth Phelps was committed towards the goals for the session and treatment as a whole. Therapist provided supportive therapy.   Interventions: interpersonal & cbt  Diagnosis:  Generalized anxiety disorder  Moderate episode of recurrent major depressive disorder Elizabeth Phelps)   Treatment Plan:  Client Abilities/Strengths Elizabeth Phelps is intelligent, forthcoming, and motivated for change.   Support System: Family and friends.  Client Treatment Preferences OPT  Client Statement of Needs Elizabeth Phelps would like to manage her anxiety,  learning about anxiety and depression, processing past events and relationships, increase self-care.   Treatment Level Weekly  Symptoms  Anxiety: Rumination, feeling anxious, difficulty managing worry, worrying about different things, trouble relaxing, restlessness, irritability, feeling something awful might happen.    (Status: maintained)  Depression: loss of interest, feeling down, fluctuating sleep, increased appetite & weight-gain, feeling bad about self, trouble concentrating, psycho-motor, low motivation,    (Status: maintained)  Goals:   c experiences symptoms of anxiety, depression, and inattention.    Target Date: 11/06/22 Frequency: Weekly  Progress: 0 Modality: individual    Therapist will provide referrals for additional resources as appropriate.  Therapist will provide psycho-education regarding Elizabeth Phelps's diagnosis and corresponding treatment approaches and interventions. Licensed Clinical Social Worker, [name], LCSW will support the patient's ability to achieve the goals identified. will employ CBT, BA, Problem-solving, Solution Focused, Mindfulness,  coping skills, & other evidenced-based practices will be used to promote progress towards healthy functioning to help manage decrease symptoms associated with her diagnosis.   Reduce overall level, frequency, and intensity of the feelings of depression, anxiety  as evidenced by decreased overall symptoms from 6 to 7 days/week to 0 to 1  days/week per client report for at least 3 consecutive months. Verbally express understanding of the relationship between feelings of depression, anxiety and their impact on thinking patterns and behaviors. Verbalize an understanding of the role that distorted thinking plays in creating fears, excessive worry, and ruminations.   Elizabeth Phelps participated in the creation of the treatment plan)   Elizabeth Irish, LCSW

## 2022-05-03 ENCOUNTER — Other Ambulatory Visit (HOSPITAL_COMMUNITY): Payer: Self-pay | Admitting: Interventional Radiology

## 2022-05-03 ENCOUNTER — Telehealth (HOSPITAL_COMMUNITY): Payer: Self-pay

## 2022-05-03 DIAGNOSIS — I671 Cerebral aneurysm, nonruptured: Secondary | ICD-10-CM

## 2022-05-03 NOTE — Telephone Encounter (Signed)
Called to schedule mri, no answer, left vm. AW 

## 2022-05-14 ENCOUNTER — Encounter (HOSPITAL_BASED_OUTPATIENT_CLINIC_OR_DEPARTMENT_OTHER): Payer: Medicare Other | Admitting: Internal Medicine

## 2022-05-21 ENCOUNTER — Ambulatory Visit (HOSPITAL_COMMUNITY)
Admission: RE | Admit: 2022-05-21 | Discharge: 2022-05-21 | Disposition: A | Discharge status: Home / Self Care | Payer: 59 | Source: Ambulatory Visit | Attending: Interventional Radiology | Admitting: Interventional Radiology

## 2022-05-21 DIAGNOSIS — I671 Cerebral aneurysm, nonruptured: Secondary | ICD-10-CM | POA: Insufficient documentation

## 2022-05-27 ENCOUNTER — Ambulatory Visit (INDEPENDENT_AMBULATORY_CARE_PROVIDER_SITE_OTHER): Payer: 59 | Admitting: Psychology

## 2022-05-27 DIAGNOSIS — F331 Major depressive disorder, recurrent, moderate: Secondary | ICD-10-CM

## 2022-05-27 DIAGNOSIS — F411 Generalized anxiety disorder: Secondary | ICD-10-CM | POA: Diagnosis not present

## 2022-05-27 NOTE — Progress Notes (Signed)
[facility] Counselor/Therapist Progress Note  Patient ID: Elizabeth Phelps, MRN: [id number]   Date: 05/27/22  Time Spent: 2:37 pm - 3:27 pm : 50 Minutes  Treatment Type: Individual Therapy.  Reported Symptoms: Anxiety, depression, and inattention.   Mental Status Exam: Appearance:  Neat     Behavior: Appropriate  Motor: Normal  Speech/Language:  Clear and Coherent  Affect: Flat  Mood: anxious  Thought process: normal  Thought content:   WNL  Sensory/Perceptual disturbances:   WNL  Orientation: oriented to person, place, time/date, and situation  Attention: Good  Concentration: Good  Memory: WNL  Fund of knowledge:  Good  Insight:   Good  Judgment:  Good  Impulse Control: Good   Risk Assessment: Danger to Self:  No Self-injurious Behavior: No Danger to Others: No Duty to Warn:no Physical Aggression / Violence:No  Access to Firearms a concern: No  Gang Involvement:No   Subjective:   [name] participated from home, via video and consented to treatment. Therapist participated from office. We met online due to [place] pandemic. Ceonna reviewed the events of the past week. Ailyne noted her son's temporary move into her home as he finds a new home. She noted working on facilitating a healthier relationship with her daughter in-law. She noted having a recent follow-up with her doctor that discovered additional aneurisms. She noted some anxiety regarding having someone in her home, her son, but noted her happiness towards spending time with her son and resolving some unresolved issues. We worked on processing her anxiety regarding her health and will continue to do so going forward.We will work on verbal impulsivity. Therapist validated Yomaira's feelings and experience and provided supportive therapy. We scheduled a follow-up for continued treatment.   Interventions: interpersonal   Diagnosis:  Generalized anxiety disorder  Moderate episode of recurrent major  depressive disorder [facility])   Treatment Plan:  Client Abilities/Strengths Jo is intelligent, forthcoming, and motivated for change.   Support System: Family and friends.  Client Treatment Preferences OPT  Client Statement of Needs Latish would like to manage her anxiety,  learning about anxiety and depression, processing past events and relationships, increase self-care.   Treatment Level Weekly  Symptoms  Anxiety: Rumination, feeling anxious, difficulty managing worry, worrying about different things, trouble relaxing, restlessness, irritability, feeling something awful might happen.    (Status: maintained)  Depression: loss of interest, feeling down, fluctuating sleep, increased appetite & weight-gain, feeling bad about self, trouble concentrating, psycho-motor, low motivation,    (Status: maintained)  Goals:   c experiences symptoms of anxiety, depression, and inattention.    Target Date: 11/06/22 Frequency: Weekly  Progress: 0 Modality: individual    Therapist will provide referrals for additional resources as appropriate.  Therapist will provide psycho-education regarding Angelene's diagnosis and corresponding treatment approaches and interventions. Licensed Clinical Social Worker, [place], LCSW will support the patient's ability to achieve the goals identified. will employ CBT, BA, Problem-solving, Solution Focused, Mindfulness,  coping skills, & other evidenced-based practices will be used to promote progress towards healthy functioning to help manage decrease symptoms associated with her diagnosis.   Reduce overall level, frequency, and intensity of the feelings of depression, anxiety as evidenced by decreased overall symptoms from 6 to 7 days/week to 0 to 1 days/week per client report for at least 3 consecutive months. Verbally express understanding of the relationship between feelings of depression, anxiety and their impact on thinking patterns and  behaviors. Verbalize an understanding of the role that distorted thinking  plays in creating fears, excessive worry, and ruminations.   [name] participated in the creation of the treatment plan)   [name], LCSW

## 2022-06-03 ENCOUNTER — Ambulatory Visit (HOSPITAL_BASED_OUTPATIENT_CLINIC_OR_DEPARTMENT_OTHER): Payer: 59 | Admitting: Internal Medicine

## 2022-06-13 ENCOUNTER — Ambulatory Visit: Payer: 59 | Admitting: Psychology

## 2022-06-13 ENCOUNTER — Ambulatory Visit
Admission: RE | Admit: 2022-06-13 | Discharge: 2022-06-13 | Disposition: A | Discharge status: Home / Self Care | Payer: 59 | Source: Ambulatory Visit | Attending: Family Medicine | Admitting: Family Medicine

## 2022-06-13 ENCOUNTER — Encounter: Payer: Self-pay | Admitting: Family Medicine

## 2022-06-13 ENCOUNTER — Encounter: Payer: Self-pay | Admitting: *Deleted

## 2022-06-13 ENCOUNTER — Telehealth: Payer: Self-pay

## 2022-06-13 ENCOUNTER — Ambulatory Visit (INDEPENDENT_AMBULATORY_CARE_PROVIDER_SITE_OTHER): Payer: 59 | Admitting: Family Medicine

## 2022-06-13 VITALS — BP 130/80 | HR 80 | Temp 99.2°F | Ht 63.0 in | Wt 147.8 lb

## 2022-06-13 DIAGNOSIS — R109 Unspecified abdominal pain: Secondary | ICD-10-CM | POA: Diagnosis not present

## 2022-06-13 DIAGNOSIS — K754 Autoimmune hepatitis: Secondary | ICD-10-CM

## 2022-06-13 DIAGNOSIS — Z8679 Personal history of other diseases of the circulatory system: Secondary | ICD-10-CM

## 2022-06-13 DIAGNOSIS — K5792 Diverticulitis of intestine, part unspecified, without perforation or abscess without bleeding: Secondary | ICD-10-CM

## 2022-06-13 DIAGNOSIS — K743 Primary biliary cirrhosis: Secondary | ICD-10-CM | POA: Diagnosis not present

## 2022-06-13 DIAGNOSIS — F17200 Nicotine dependence, unspecified, uncomplicated: Secondary | ICD-10-CM

## 2022-06-13 LAB — POCT URINALYSIS DIP (PROADVANTAGE DEVICE)
Bilirubin, UA: NEGATIVE
Blood, UA: NEGATIVE
Glucose, UA: NEGATIVE mg/dL
Ketones, POC UA: NEGATIVE mg/dL
Nitrite, UA: NEGATIVE
Protein Ur, POC: NEGATIVE mg/dL
Specific Gravity, Urine: 1.01
Urobilinogen, Ur: 0.2
pH, UA: 6.5 (ref 5.0–8.0)

## 2022-06-13 LAB — COMPREHENSIVE METABOLIC PANEL
ALT: 11 IU/L (ref 0–32)
AST: 26 IU/L (ref 0–40)
Albumin/Globulin Ratio: 1.4 (ref 1.2–2.2)
Albumin: 4.2 g/dL (ref 3.9–4.9)
Alkaline Phosphatase: 94 IU/L (ref 44–121)
BUN/Creatinine Ratio: 15 (ref 12–28)
BUN: 11 mg/dL (ref 8–27)
Bilirubin Total: 0.7 mg/dL (ref 0.0–1.2)
CO2: 25 mmol/L (ref 20–29)
Calcium: 9.4 mg/dL (ref 8.7–10.3)
Chloride: 101 mmol/L (ref 96–106)
Creatinine, Ser: 0.73 mg/dL (ref 0.57–1.00)
Globulin, Total: 2.9 g/dL (ref 1.5–4.5)
Glucose: 94 mg/dL (ref 70–99)
Potassium: 4.4 mmol/L (ref 3.5–5.2)
Sodium: 136 mmol/L (ref 134–144)
Total Protein: 7.1 g/dL (ref 6.0–8.5)
eGFR: 91 mL/min/{1.73_m2} (ref >59)

## 2022-06-13 LAB — CBC WITH DIFFERENTIAL/PLATELET
Basophils Absolute: 0 10*3/uL (ref 0.0–0.2)
Basos: 0 %
EOS (ABSOLUTE): 0 10*3/uL (ref 0.0–0.4)
Eos: 0 %
Hematocrit: 41.9 % (ref 34.0–46.6)
Hemoglobin: 14.1 g/dL (ref 11.1–15.9)
Lymphocytes Absolute: 1.1 10*3/uL (ref 0.7–3.1)
Lymphs: 10 %
MCH: 29.4 pg (ref 26.6–33.0)
MCHC: 33.7 g/dL (ref 31.5–35.7)
MCV: 87 fL (ref 79–97)
Monocytes Absolute: 1.2 10*3/uL — ABNORMAL HIGH (ref 0.1–0.9)
Monocytes: 11 %
Neutrophils Absolute: 8.5 10*3/uL — ABNORMAL HIGH (ref 1.4–7.0)
Neutrophils: 79 %
Platelets: 167 10*3/uL (ref 150–450)
RBC: 4.8 x10E6/uL (ref 3.77–5.28)
RDW: 14.3 % (ref 11.7–15.4)
WBC: 10.9 10*3/uL — ABNORMAL HIGH (ref 3.4–10.8)

## 2022-06-13 MED ORDER — METRONIDAZOLE 500 MG PO TABS: 500.0000 mg | ORAL_TABLET | Freq: Three times a day (TID) | ORAL | 0 refills | Status: AC

## 2022-06-13 MED ORDER — CIPROFLOXACIN HCL 500 MG PO TABS: 500.0000 mg | ORAL_TABLET | Freq: Two times a day (BID) | ORAL | 0 refills | Status: AC

## 2022-06-13 MED ORDER — IOPAMIDOL (ISOVUE-300) INJECTION 61%
100.0000 mL | Freq: Once | INTRAVENOUS | Status: AC | PRN
  Administered 2022-06-13: 100 mL via INTRAVENOUS

## 2022-06-13 NOTE — Telephone Encounter (Signed)
Patient advised and scheduled her for next Thursday with Dr. Tomi Bamberger for 11:45.

## 2022-06-13 NOTE — Telephone Encounter (Signed)
Please advise pt that her CT showed sigmoid diverticulitis. I sent in prescription for cipro and flagyl to her [facility]. She needs to schedule a f/u next week for re-check. I'm adding a handout to her AVS on diverticulitis that she can look at on her MyChart.

## 2022-06-13 NOTE — Progress Notes (Signed)
Chief Complaint  Patient presents with   Abdominal Pain    Abdominal pain that started yesterday afternoon. Pain is lower and across her abdomen and radiates around to her back. She thought she had to have a BM but that was not the case. She uses Miralax typically a couple of times a week, did not use yesterday but did use today. She had a BM yesterday morning prior to the pain. Could not give UA, will try again.   Yesterday she started having lower abdominal pain, around both sides, extending around to her back. She noticed this in the late afternoon.   She had a bowel movement yesterday morning (uses miralax in her coffee 2-3x/week). She felt like she emptied well, didn't think she was constipated, but felt like she could have been.  Pain started after eating some egg salad and crackers (not immediately). She tried to walk it off, didn't help. Went to bed at 8:30 pm, but couldn't get comfortable to sleep.  Fell asleep after midnight, and up since 2 am, due to the discomfort. Feels better to lay down on her back.  Some nausea at 3am, while sitting on the toilet--felt like she had to have a BM or pass gas. No vomiting. Feels like a gassy pain, but doesn't think it is gas. Currently has a dull pain, extending around the entire lower abdomen and back.  Sometimes comes in waves where the pain worsens.  She takes Electronics engineer gummies.  Prior to taking these, she had problems with swelling in her upper stomach.   PMH, PSH, SH reviewed Cerebral aneurysms PBC, cirrhosis, autoimmune hepatitis Anxiety H/o kidney stone >40 years ago  Smoker--starting to use a vape without nicotine, which is helping her cut down on smoking.  Last [place] 01/2022--cirrhosis, no focal lesion  Outpatient Encounter Medications as of 06/13/2022  Medication Sig Note   aspirin EC 81 MG tablet Take 81 mg by mouth daily. Swallow whole.    atorvastatin (LIPITOR) 40 MG tablet Take 1 tablet (40 mg total) by mouth daily.     azaTHIOprine (IMURAN) 50 MG tablet Take 100 mg by mouth at bedtime.    clopidogrel (PLAVIX) 75 MG tablet Take 1 tablet (75 mg total) by mouth daily.    Multiple Vitamin (MULTIVITAMIN WITH MINERALS) TABS Take 1 tablet by mouth at bedtime.    OCALIVA 5 MG TABS Take 5 mg by mouth at bedtime.    PARoxetine (PAXIL) 20 MG tablet Take 1 tablet (20 mg total) by mouth daily.    polyethylene glycol (MIRALAX / GLYCOLAX) 17 g packet Take 17 g by mouth daily. 06/13/2022: Takes a couple times a week, took this am.   ibuprofen (ADVIL) 200 MG tablet Take 400 mg by mouth every 6 (six) hours as needed for headache. (Patient not taking: Reported on 06/13/2022) 06/13/2022: As needed   omeprazole (PRILOSEC) 20 MG capsule Take 1 capsule (20 mg total) by mouth every other day. (Patient not taking: Reported on 06/13/2022) 06/13/2022: prn   zolmitriptan (ZOMIG) 5 MG tablet Take 1 tablet (5 mg total) by mouth as needed for migraine. (Patient not taking: Reported on 06/13/2022) 06/13/2022: prn   No facility-administered encounter medications on file as of 06/13/2022.   Allergies  Allergen Reactions   Codeine Nausea And Vomiting    ROS:  denies any known f/c. GI complaints per HPI.  No URI symptoms, chest pain, shortness of breath. Denies urinary complaints--no dysuria, urgency or frequency. No vaginal discharge, odor, itch or bleeding. No  neuro complaints (HA, dizziness, focal abnl).   PHYSICAL EXAM:  BP 130/80   Pulse 80   Temp 99.2 F (37.3 C) (Tympanic)   Ht '5\' 3"'$  (1.6 m)   Wt 147 lb 12.8 oz (67 kg)   BMI 26.18 kg/m   Pleasant, well-appearing female who does not appear to be in any acute distress HEENT: conjunctiva and sclera are clear, noninjected, anicteric.  OP is clear Neck: No lymphadenopathy or mass Heart: regular rate and rhythm Lungs: clear bilaterally Abdomen: Tender in epigastrium, and across the entire lower stomach, with some guarding at RLQ and LLQ. No appreciable mass. No rebound  tenderness Back: Tender at L lower lumbar paraspinous muscle.  Nontender on the R. No SI tenderness. No spinal or CVA tenderness. Extremities: no edema Neuro: alert and oriented, cranial nerves grossly intact, normal gait Psych: normal mood, affect, hygiene and grooming  Urine dip: small leukocytes. Neg nitrite, protein, blood. SG 1.010   ASSESSMENT/PLAN:   Acute diverticulitis - treat with cipro and flagyl and f/u next week - Plan: metroNIDAZOLE (FLAGYL) 500 MG tablet, ciprofloxacin (CIPRO) 500 MG tablet  Abdominal pain, unspecified abdominal location - nonspecific; pt with known cirrhosis, h/o cerebral aneurysms.  Guarding on exam--needs CT eval - Plan: POCT Urinalysis DIP (Proadvantage Device), CBC with Differential/Platelet, Comprehensive metabolic panel, CT ABDOMEN PELVIS W CONTRAST, Urine Culture  Autoimmune hepatitis ([place]) - on meds, reportedly stable  Primary biliary cholangitis ([place]) - on meds, reportedly stable. With cirhhosis.  labs reviewed, normal liver function (alb, INR, nl LFTs)  Current smoker - working on cutting back (by using vape, without nicotine); counseled and encouraged cessation  History of cerebral aneurysm   I spent 45 minutes dedicated to the care of this patient, including pre-visit review of records, face to face time, post-visit ordering of testing and documentation.  Addendum:  Lab Results  Component Value Date   WBC 10.9 (H) 06/13/2022   HGB 14.1 06/13/2022   HCT 41.9 06/13/2022   MCV 87 06/13/2022   PLT 167 06/13/2022     Chemistry      Component Value Date/Time   NA 136 06/13/2022 1240   K 4.4 06/13/2022 1240   CL 101 06/13/2022 1240   CO2 25 06/13/2022 1240   BUN 11 06/13/2022 1240   CREATININE 0.73 06/13/2022 1240   CREATININE 0.68 11/05/2016 0736      Component Value Date/Time   CALCIUM 9.4 06/13/2022 1240   ALKPHOS 94 06/13/2022 1240   AST 26 06/13/2022 1240   ALT 11 06/13/2022 1240   BILITOT 0.7 06/13/2022 1240     CT  scan:  IMPRESSION: 1. Acute sigmoid diverticulitis. No perforation or abscess. 2. Unchanged cirrhosis. 3.  Aortic Atherosclerosis (ICD10-I70.0).

## 2022-06-13 NOTE — Telephone Encounter (Signed)
Call report from [place] for CT Abdomen and pelvis with contrast:  IMPRESSION: 1. Acute sigmoid diverticulitis. No perforation or abscess. 2. Unchanged cirrhosis. 3.  Aortic Atherosclerosis (ICD10-I70.0).

## 2022-06-15 LAB — URINE CULTURE

## 2022-06-19 NOTE — Progress Notes (Unsigned)
No chief complaint on file.  Patient presents for f/u on diverticulitis. She was last seen on 12/21 with a one-day h/o lower abdominal pain (across the whole lower abdomen), and extending around to her back.  WBC was elevated at 10.9, normal chem panel. CT showed: IMPRESSION: 1. Acute sigmoid diverticulitis. No perforation or abscess. 2. Unchanged cirrhosis. 3.  Aortic Atherosclerosis (ICD10-I70.0).  She was treated with cipro and flagyl (10 day course, still taking)    PMH, PSH, SH reviewed Cerebral aneurysms PBC, cirrhosis, autoimmune hepatitis Anxiety H/o kidney stone >40 years ago Smoker (cutting back, using non-nicotine vape)   ROS:  no fever, chills, URI symptoms, urinary complaints. No n/v/d. Abdominal pain   PHYSICAL EXAM:  There were no vitals taken for this visit.  Wt Readings from Last 3 Encounters:  06/13/22 147 lb 12.8 oz (67 kg)  04/17/22 149 lb (67.6 kg)  04/15/22 147 lb (66.7 kg)    Pleasant, well-appearing female who does not appear to be in any acute distress HEENT: conjunctiva and sclera are clear, noninjected, anicteric.  OP is clear Neck: No lymphadenopathy or mass Heart: regular rate and rhythm Lungs: clear bilaterally Abdomen: Tender in epigastrium, and across the entire lower stomach, with some guarding at RLQ and LLQ. No appreciable mass. No rebound tenderness Back: Tender at L lower lumbar paraspinous muscle.  Nontender on the R. No SI tenderness. No spinal or CVA tenderness. Extremities: no edema Neuro: alert and oriented, cranial nerves grossly intact, normal gait Psych: normal mood, affect, hygiene and grooming  ***UPDATE ABD EXAM   ASSESSMENT/PLAN:

## 2022-06-20 ENCOUNTER — Ambulatory Visit (INDEPENDENT_AMBULATORY_CARE_PROVIDER_SITE_OTHER): Payer: 59 | Admitting: Family Medicine

## 2022-06-20 ENCOUNTER — Encounter: Payer: Self-pay | Admitting: Family Medicine

## 2022-06-20 VITALS — BP 126/82 | HR 64 | Temp 98.3°F | Wt 148.8 lb

## 2022-06-20 DIAGNOSIS — K5732 Diverticulitis of large intestine without perforation or abscess without bleeding: Secondary | ICD-10-CM

## 2022-06-20 DIAGNOSIS — K5900 Constipation, unspecified: Secondary | ICD-10-CM

## 2022-06-20 DIAGNOSIS — I700 Atherosclerosis of aorta: Secondary | ICD-10-CM | POA: Diagnosis not present

## 2022-06-20 NOTE — Patient Instructions (Addendum)
Complete the course of antibiotics.   Continue to drink plenty of water. Your urine should be very pale yellow. Continue the miralax once daily. Continue to try and eat a high fiber diet (consider adding a benefiber or metamucil supplement). Regular exercise will also help your bowels move. If you still aren't getting any results, consider using a suppository (the rectal stimulation can help). I'd stay away from a stimulant laxative at this point (as I feel the Miralax likely will start working soon).

## 2022-07-18 ENCOUNTER — Other Ambulatory Visit: Payer: Self-pay | Admitting: Family Medicine

## 2022-07-18 DIAGNOSIS — F411 Generalized anxiety disorder: Secondary | ICD-10-CM

## 2022-08-22 ENCOUNTER — Telehealth: Payer: Self-pay | Admitting: Family Medicine

## 2022-08-22 DIAGNOSIS — I671 Cerebral aneurysm, nonruptured: Secondary | ICD-10-CM

## 2022-08-22 MED ORDER — CLOPIDOGREL BISULFATE 75 MG PO TABS: 75.0000 mg | ORAL_TABLET | Freq: Every day | ORAL | 3 refills | Status: DC

## 2022-08-22 NOTE — Telephone Encounter (Signed)
PHARMACY SENT refill request for plavix please send to the  [facility] ([facility]), [place] [address]

## 2022-10-14 ENCOUNTER — Other Ambulatory Visit: Payer: Self-pay | Admitting: Family Medicine

## 2022-10-14 DIAGNOSIS — F411 Generalized anxiety disorder: Secondary | ICD-10-CM

## 2022-10-24 ENCOUNTER — Telehealth: Payer: Self-pay | Admitting: Neurology

## 2022-10-24 NOTE — Telephone Encounter (Signed)
Dizzy spells happening a week before she fell. Patient didn't go to hospital patient doesn't drive and is on blood thinners. She called 911 and they glued her chin. Patient has constant falls

## 2022-10-24 NOTE — Telephone Encounter (Signed)
Pt called in stating she has been having dizzy spells and fell last week and had to get stitches in her chin. She says the dizziness is so bad she has trouble getting out of bed. She also states her head feels big, she has trouble holding it up. She stated the dizziness has been happening about 3 weeks.

## 2022-10-24 NOTE — Telephone Encounter (Signed)
Pls confirm which hospital she went to, did they do a head CT? If she is falling and still dizzy, would go to ER for further imaging. Thanks

## 2022-10-25 ENCOUNTER — Encounter (HOSPITAL_BASED_OUTPATIENT_CLINIC_OR_DEPARTMENT_OTHER): Payer: Self-pay | Admitting: Urology

## 2022-10-25 ENCOUNTER — Emergency Department (HOSPITAL_BASED_OUTPATIENT_CLINIC_OR_DEPARTMENT_OTHER): Payer: 59

## 2022-10-25 ENCOUNTER — Emergency Department (HOSPITAL_BASED_OUTPATIENT_CLINIC_OR_DEPARTMENT_OTHER)
Admission: EM | Admit: 2022-10-25 | Discharge: 2022-10-25 | Disposition: A | Discharge status: Home / Self Care | Payer: 59 | Attending: Emergency Medicine | Admitting: Emergency Medicine

## 2022-10-25 DIAGNOSIS — Z7982 Long term (current) use of aspirin: Secondary | ICD-10-CM | POA: Diagnosis not present

## 2022-10-25 DIAGNOSIS — R42 Dizziness and giddiness: Secondary | ICD-10-CM | POA: Diagnosis present

## 2022-10-25 LAB — CBC WITH DIFFERENTIAL/PLATELET
Abs Immature Granulocytes: 0.02 10*3/uL (ref 0.00–0.07)
Basophils Absolute: 0 10*3/uL (ref 0.0–0.1)
Basophils Relative: 1 %
Eosinophils Absolute: 0.1 10*3/uL (ref 0.0–0.5)
Eosinophils Relative: 1 %
HCT: 41.2 % (ref 36.0–46.0)
Hemoglobin: 13.5 g/dL (ref 12.0–15.0)
Immature Granulocytes: 0 %
Lymphocytes Relative: 15 %
Lymphs Abs: 0.8 10*3/uL (ref 0.7–4.0)
MCH: 29.2 pg (ref 26.0–34.0)
MCHC: 32.8 g/dL (ref 30.0–36.0)
MCV: 89 fL (ref 80.0–100.0)
Monocytes Absolute: 0.7 10*3/uL (ref 0.1–1.0)
Monocytes Relative: 12 %
Neutro Abs: 3.8 10*3/uL (ref 1.7–7.7)
Neutrophils Relative %: 71 %
Platelets: 152 10*3/uL (ref 150–400)
RBC: 4.63 MIL/uL (ref 3.87–5.11)
RDW: 13.8 % (ref 11.5–15.5)
WBC: 5.4 10*3/uL (ref 4.0–10.5)
nRBC: 0 % (ref 0.0–0.2)

## 2022-10-25 LAB — BASIC METABOLIC PANEL WITH GFR
Anion gap: 7 (ref 5–15)
BUN: 11 mg/dL (ref 8–23)
CO2: 24 mmol/L (ref 22–32)
Calcium: 8.8 mg/dL — ABNORMAL LOW (ref 8.9–10.3)
Chloride: 105 mmol/L (ref 98–111)
Creatinine, Ser: 0.73 mg/dL (ref 0.44–1.00)
GFR, Estimated: >60 mL/min
Glucose, Bld: 96 mg/dL (ref 70–99)
Potassium: 3.9 mmol/L (ref 3.5–5.1)
Sodium: 136 mmol/L (ref 135–145)

## 2022-10-25 MED ORDER — IOHEXOL 350 MG/ML SOLN
75.0000 mL | Freq: Once | INTRAVENOUS | Status: AC | PRN
  Administered 2022-10-25: 75 mL via INTRAVENOUS

## 2022-10-25 NOTE — ED Triage Notes (Signed)
Neurologist sent due to multiple falls  States fall 2 weeks ago States headache that started this am and dizziness x 3 weeks  Has h/o 8 aneurysms in head and was sent for CT scan   Is on Blood thinners

## 2022-10-25 NOTE — ED Notes (Signed)
Discharge paperwork reviewed entirely with patient, including follow up care. Pain was under control. No prescriptions were called in, but all questions were addressed.  Pt verbalized understanding as well as all parties involved. No questions or concerns voiced at the time of discharge. No acute distress noted.   Pt ambulated out to PVA without incident or assistance.  

## 2022-10-25 NOTE — ED Provider Notes (Signed)
[place] [facility] AT [facility] Provider Note   CSN: [id number] Arrival date & time: 10/25/22  1243     History  Chief Complaint  Patient presents with   Marletta Lor    Elizabeth Phelps is a 67 y.o. female.  Patient here with headache, intermittent dizziness the last few weeks.  She is not having any symptoms now.  She saw her neurologist and given that she had a fall recently and been having some of the symptoms and has history of aneurysms thought she should get emergent imaging.  She has no weakness or numbness.  No vision loss or dizzy symptoms currently.  She states that she does have anxiety at times.  She uses marijuana to help with.  She denies any chest pain or shortness of breath.  Denies any numbness or tingling.  She follows with Dr. Ferd Glassing otherwise regarding her aneurysms as he is done stents and several of them.  The history is provided by the patient.       Home Medications Prior to Admission medications   Medication Sig Start Date End Date Taking? Authorizing Provider  aspirin EC 81 MG tablet Take 81 mg by mouth daily. Swallow whole.    [provider]  atorvastatin (LIPITOR) 40 MG tablet Take 1 tablet (40 mg total) by mouth daily. 10/15/21   Ronnald Nian, MD  azaTHIOprine (IMURAN) 50 MG tablet Take 100 mg by mouth at bedtime. 02/04/20   [provider]  clopidogrel (PLAVIX) 75 MG tablet Take 1 tablet (75 mg total) by mouth daily. 08/22/22   Ronnald Nian, MD  ibuprofen (ADVIL) 200 MG tablet Take 400 mg by mouth every 6 (six) hours as needed for headache. Patient not taking: Reported on 06/13/2022    [provider]  Multiple Vitamin (MULTIVITAMIN WITH MINERALS) TABS Take 1 tablet by mouth at bedtime.    [provider]  OCALIVA 5 MG TABS Take 5 mg by mouth at bedtime. 08/19/18   [provider]  PARoxetine (PAXIL) 20 MG tablet Take 1 tablet by mouth once daily 10/14/22   Ronnald Nian, MD   polyethylene glycol (MIRALAX / GLYCOLAX) 17 g packet Take 17 g by mouth daily.    [provider]  zolmitriptan (ZOMIG) 5 MG tablet Take 1 tablet (5 mg total) by mouth as needed for migraine. Patient not taking: Reported on 06/13/2022 10/15/21   Ronnald Nian, MD      Allergies    Codeine    Review of Systems   Review of Systems  Physical Exam Updated Vital Signs BP 116/80 (BP Location: Left Arm)   Pulse 82   Temp 98.3 F (36.8 C) (Oral)   Resp 18   Ht 5\' 3"  (1.6 m)   Wt 67.5 kg   SpO2 99%   BMI 26.36 kg/m  Physical Exam Vitals and nursing note reviewed.  Constitutional:      General: She is not in acute distress.    Appearance: She is well-developed. She is not ill-appearing.  HENT:     Head: Normocephalic and atraumatic.     Nose: Nose normal.     Mouth/Throat:     Mouth: Mucous membranes are moist.  Eyes:     Extraocular Movements: Extraocular movements intact.     Conjunctiva/sclera: Conjunctivae normal.     Pupils: Pupils are equal, round, and reactive to light.  Cardiovascular:     Rate and Rhythm: Normal rate and regular rhythm.  Pulses: Normal pulses.     Heart sounds: Normal heart sounds. No murmur heard. Pulmonary:     Effort: Pulmonary effort is normal. No respiratory distress.     Breath sounds: Normal breath sounds.  Abdominal:     Palpations: Abdomen is soft.     Tenderness: There is no abdominal tenderness.  Musculoskeletal:        General: No swelling.     Cervical back: Normal range of motion and neck supple.  Skin:    General: Skin is warm and dry.     Capillary Refill: Capillary refill takes less than 2 seconds.  Neurological:     General: No focal deficit present.     Mental Status: She is alert and oriented to person, place, and time.     Cranial Nerves: No cranial nerve deficit.     Sensory: No sensory deficit.     Motor: No weakness.     Coordination: Coordination normal.     Comments: 5+ out of 5 strength throughout,  normal sensation, no drift, normal finger-nose-finger, normal speech, normal visual field  Psychiatric:        Mood and Affect: Mood normal.     ED Results / Procedures / Treatments   Labs (all labs ordered are listed, but only abnormal results are displayed) Labs Reviewed  BASIC METABOLIC PANEL - Abnormal; Notable for the following components:      Result Value   Calcium 8.8 (*)    All other components within normal limits  CBC WITH DIFFERENTIAL/PLATELET    EKG EKG Interpretation  Date/Time:  Friday Oct 25 2022 13:35:00 EDT Ventricular Rate:  65 PR Interval:  175 QRS Duration: 114 QT Interval:  423 QTC Calculation: 440 R Axis:   43 Text Interpretation: Sinus rhythm Incomplete right bundle branch block Low voltage, precordial leads Confirmed by Virgina Norfolk ([id number]) on 10/25/2022 1:39:27 PM  Radiology CT ANGIO HEAD NECK W WO CM  Result Date: 10/25/2022 CLINICAL DATA:  Neuro deficit, acute, stroke suspected. Headache. Dizziness. Multiple falls. EXAM: CT HEAD WITHOUT CONTRAST CT ANGIOGRAPHY OF THE HEAD AND NECK TECHNIQUE: Contiguous axial images were obtained from the base of the skull through the vertex without intravenous contrast. Multidetector CT imaging of the head and neck was performed using the standard protocol during bolus administration of intravenous contrast. Multiplanar CT image reconstructions and MIPs were obtained to evaluate the vascular anatomy. Carotid stenosis measurements (when applicable) are obtained utilizing NASCET criteria, using the distal internal carotid diameter as the denominator. RADIATION DOSE REDUCTION: This exam was performed according to the departmental dose-optimization program which includes automated exposure control, adjustment of the mA and/or kV according to patient size and/or use of iterative reconstruction technique. CONTRAST:  75mL OMNIPAQUE IOHEXOL 350 MG/ML SOLN COMPARISON:  Head CT 11/29/2021.  MRI/MRA head 05/21/2022. FINDINGS: CT HEAD  Brain: No acute intracranial hemorrhage. Unchanged encephalomalacia in the right anterior temporal lobe and subinsular region from prior hemorrhage. Mild chronic small-vessel disease. Otherwise, gray-white differentiation is preserved. No hydrocephalus or extra-axial collection. No mass effect or midline shift. Vascular: Right ICA terminus to right MCA flow diverting stent. Skull: Prior right pterional craniotomy. Sinuses/Orbits: Unremarkable. CTA NECK Aortic arch: Three-vessel arch configuration. Arch vessel origins are patent. Right carotid system: The common and internal carotid arteries are patent to the skull base without stenosis, aneurysm or dissection. Left carotid system: The common and internal carotid arteries are patent to the skull base without stenosis, aneurysm or dissection. Vertebral arteries: Patent from the origin  to the confluence with the basilar without stenosis or dissection. Skeleton: Unremarkable. Other neck: Unremarkable. CTA HEAD Anterior circulation: Intracranial ICAs are patent without stenosis or aneurysm. Flow diverting stent across the right ICA terminus and proximal M1 segment of the right MCA. Small amount of residual enhancement of the excluded aneurysm neck (coronal image 180 series 12), unchanged from the most recent prior MRA. No other aneurysms of the MCAs or ACAs are seen. Distal branches are symmetric. Posterior circulation: Normal basilar artery. The SCAs, AICAs and PICAs are patent proximally. The PCAs are patent proximally without stenosis or aneurysm. Distal branches are symmetric. Venous sinuses: Patent. Anatomic variants: None. 3 IMPRESSION: 1. No acute intracranial process. Unchanged encephalomalacia in the right anterior temporal lobe and subinsular region from prior hemorrhage. 2. Flow diverting stent across the right ICA terminus and proximal M1 segment of the right MCA. Small amount of residual enhancement of the excluded aneurysm neck, unchanged from the most  recent prior MRA. 3. No hemodynamically significant stenosis in the neck. Electronically Signed   By: Orvan Falconer M.D.   On: 10/25/2022 14:43    Procedures Procedures    Medications Ordered in ED Medications  iohexol (OMNIPAQUE) 350 MG/ML injection 75 mL (75 mLs Intravenous Contrast Given 10/25/22 1413)    ED Course/ Medical Decision Making/ A&P                             Medical Decision Making Amount and/or Complexity of Data Reviewed Labs: ordered. Radiology: ordered.  Risk Prescription drug management.   BROOKLYNN DIVENS is here with migraine.  She has been having some dizzy episodes last couple weeks.  History of brain aneurysms with stent placements in the past.  Symptomatic now.  Sent by neurology for some head imaging.  She denies any black or bloody stools.  She denies any episodes of loss of consciousness.  She had 1 episode where she got lightheaded and dizzy when she stood up a few weeks ago and fell and hit her chin.  Has not had any falls since.  Her dizzy episodes only, if she stands quickly.  Neurologically she appears to be intact on exam.  Normal visual acuity, normal visual fields.  Normal strength and sensation.  Sounds almost like some orthostatic symptoms or vertigo symptoms however given her extensive brain history we will get a CTA of her head and neck and check basic labs to look for anemia or electrolyte abnormality.  EKG shows sinus rhythm.  No ischemic changes.  Radiology report of the CTA of the head and neck are unremarkable.  Unchanged vessels.  No head bleed.  CBC and BMP are unremarkable per my review and interpretation.  Overall suspect maybe she is having some mild orthostatic changes.  Vital signs are normal here.  Does not seem like she is having any symptoms currently.  Given reassurance.  Discharged in good condition.  This chart was dictated using voice recognition software.  Despite best efforts to proofread,  errors can occur which can change  the documentation meaning.         Final Clinical Impression(s) / ED Diagnoses Final diagnoses:  Dizziness    Rx / DC Orders ED Discharge Orders     None         Virgina Norfolk, DO 10/25/22 1454

## 2022-10-25 NOTE — Telephone Encounter (Signed)
Pt called back in to get an update.

## 2022-10-25 NOTE — Telephone Encounter (Signed)
Called patient back and recommended she go to ER for imaging. Patient understood and  is going today

## 2022-10-25 NOTE — Telephone Encounter (Signed)
With her history of brain aneurysm and now constant falls, she needs to go to ER where workup (brain imaging) can be expedited, thanks

## 2022-10-25 NOTE — ED Notes (Signed)
ED Provider at bedside. 

## 2022-11-11 ENCOUNTER — Telehealth (HOSPITAL_COMMUNITY): Payer: Self-pay

## 2022-11-11 NOTE — Telephone Encounter (Signed)
Called to schedule diagnostic angiogram, no answer, left vm. AB  

## 2022-11-12 ENCOUNTER — Other Ambulatory Visit (HOSPITAL_COMMUNITY): Payer: Self-pay | Admitting: Interventional Radiology

## 2022-11-12 ENCOUNTER — Other Ambulatory Visit: Payer: Self-pay | Admitting: Family Medicine

## 2022-11-12 DIAGNOSIS — I671 Cerebral aneurysm, nonruptured: Secondary | ICD-10-CM

## 2022-11-12 DIAGNOSIS — F411 Generalized anxiety disorder: Secondary | ICD-10-CM

## 2022-11-19 ENCOUNTER — Encounter: Payer: Self-pay | Admitting: Radiology

## 2022-11-21 ENCOUNTER — Other Ambulatory Visit: Payer: Self-pay | Admitting: Radiology

## 2022-11-21 ENCOUNTER — Other Ambulatory Visit: Payer: Self-pay | Admitting: Family Medicine

## 2022-11-21 ENCOUNTER — Other Ambulatory Visit: Payer: Self-pay

## 2022-11-21 ENCOUNTER — Other Ambulatory Visit: Payer: Self-pay | Admitting: Internal Medicine

## 2022-11-21 DIAGNOSIS — I700 Atherosclerosis of aorta: Secondary | ICD-10-CM

## 2022-11-21 DIAGNOSIS — I671 Cerebral aneurysm, nonruptured: Secondary | ICD-10-CM

## 2022-11-21 MED ORDER — ATORVASTATIN CALCIUM 40 MG PO TABS: 40.0000 mg | ORAL_TABLET | Freq: Every day | ORAL | 0 refills | Status: DC

## 2022-11-21 NOTE — Consult Note (Signed)
Chief Complaint: Headache, intermittent dizziness  X. Patient presents for cerebral angiogram    Supervising Physician: Julieanne Cotton  Patient Status: [facility] - Out-pt  History of Present Illness: Elizabeth Phelps is a 66 y.o. female 67 y.o. Female outpatient. Smoker. History of biliary cholangitis, migraines, depression, HLD, anxiety, Arnold Chiari malformation,  anemia. Right MCA aneurysm s/p placement of 4.5 mm x 30 mm pipeline flex shield device by Dr. Fatima Sanger on 8.4.22. Presented to the ED at [facility] on 5.3.24 with headache, intermittent dizziness  X several weeks. CT Angio from 5.3.24 reads No acute intracranial process. Unchanged encephalomalacia in the right anterior temporal lobe and subinsular region from prior hemorrhage. Flow diverting stent across the right ICA terminus and proximal M1 segment of the right MCA. Small amount of residual enhancement of the excluded aneurysm neck, unchanged from the most recent prior MRA. Patient presents for diagnostic cerebral angiogram.  Patient alert and laying in bed,calm. Endorses dizziness when standing. Denies any fevers, headache, chest pain, SOB, cough, abdominal pain, nausea, vomiting or bleeding. Return precautions and treatment recommendations and follow-up discussed with the patient  who is agreeable with the plan.   Past Medical History:  Diagnosis Date   Anemia    Arnold-Chiari malformation, type I    Atherosclerosis of aorta 10/02/2017   Attention and concentration deficit 09/02/2019   traits of ADHD at very least   Autoimmune hepatitis 08/04/2018   Patient with PBC and autoimmune hepatitis overlap on previous biopsy.   Patient has a negative AMA, ANA was positive in a dual nuclear and speckled pattern with a titer of 1:1280, ASMA was weakly positive at 27, and a mildly elevated IgG. Biopsy reported mild to moderate interface hepatitis.  She is currently on azathioprine 100 mg dail   Cerebral aneurysm without rupture     Combination of fusiform and saccular right MCA aneurysm   Complication of anesthesia    hard to wake up after brain surgery in 2002   Diverticulosis 07/21/2020   Dysthymia 04/03/2012   Generalized anxiety disorder    History of colonic polyps 08/12/2016   Hyperlipidemia 08/12/2016   LDL goal <100   Incomplete right bundle branch block 09/22/2017   Major depressive disorder    Migraine headache    Primary biliary cholangitis 08/04/2018   Primary biliary cirrhosis    Smoker     Past Surgical History:  Procedure Laterality Date   APPENDECTOMY     BRAIN SURGERY     BREAST EXCISIONAL BIOPSY Left    CATARACT EXTRACTION Right 2018   COLONOSCOPY  2007   Dr. Loreta Ave   IR ANGIO INTRA EXTRACRAN SEL COM CAROTID INNOMINATE BILAT MOD SED  01/04/2021   IR ANGIO INTRA EXTRACRAN SEL INTERNAL CAROTID UNI R MOD SED  01/24/2021   IR ANGIO VERTEBRAL SEL SUBCLAVIAN INNOMINATE UNI L MOD SED  01/04/2021   IR ANGIO VERTEBRAL SEL VERTEBRAL UNI R MOD SED  01/04/2021   IR CT HEAD LTD  01/24/2021   IR RADIOLOGIST EVAL & MGMT  12/22/2020   IR RADIOLOGIST EVAL & MGMT  01/10/2021   IR RADIOLOGIST EVAL & MGMT  02/08/2021   IR TRANSCATH/EMBOLIZ  01/24/2021   IR US GUIDE VASC ACCESS RIGHT  01/04/2021   IR US GUIDE VASC ACCESS RIGHT  01/24/2021   KNEE ARTHROSCOPY Right    RADIOLOGY WITH ANESTHESIA N/A 01/24/2021   Procedure: RADIOLOGY WITH ANESTHESIA EMBOLIZATION;  Surgeon: Julieanne Cotton, MD;  Location: [facility] OR;  Service: Radiology;  Laterality:  N/A;   SKIN BIOPSY Right 03/29/2022   squamous cell carcinoma   TONSILLECTOMY AND ADENOIDECTOMY     age 55    Allergies: Codeine  Medications: Prior to Admission medications   Medication Sig Start Date End Date Taking? Authorizing Provider  aspirin EC 81 MG tablet Take 81 mg by mouth daily. Swallow whole.    [provider]  atorvastatin (LIPITOR) 40 MG tablet Take 1 tablet (40 mg total) by mouth daily. 11/21/22   Ronnald Nian, MD  azaTHIOprine  (IMURAN) 50 MG tablet Take 100 mg by mouth at bedtime. 02/04/20   [provider]  clopidogrel (PLAVIX) 75 MG tablet Take 1 tablet (75 mg total) by mouth daily. 08/22/22   Ronnald Nian, MD  ibuprofen (ADVIL) 200 MG tablet Take 400 mg by mouth every 6 (six) hours as needed for headache. Patient not taking: Reported on 06/13/2022    [provider]  Multiple Vitamin (MULTIVITAMIN WITH MINERALS) TABS Take 1 tablet by mouth at bedtime.    [provider]  OCALIVA 5 MG TABS Take 5 mg by mouth at bedtime. 08/19/18   [provider]  PARoxetine (PAXIL) 20 MG tablet Take 1 tablet by mouth once daily 11/12/22   Ronnald Nian, MD  polyethylene glycol (MIRALAX / GLYCOLAX) 17 g packet Take 17 g by mouth daily.    [provider]  zolmitriptan (ZOMIG) 5 MG tablet Take 1 tablet (5 mg total) by mouth as needed for migraine. Patient not taking: Reported on 06/13/2022 10/15/21   Ronnald Nian, MD     Family History  Problem Relation Age of Onset   Stroke Mother    Hypertension Mother    Prostate cancer Father        mets   Aneurysm Father    Breast cancer Maternal Grandmother    Colon cancer Neg Hx    Stomach cancer Neg Hx    Esophageal cancer Neg Hx     Social History   Socioeconomic History   Marital status: Divorced    Spouse name: Not on file   Number of children: 2   Years of education: 12   Highest education level: High school graduate  Occupational History   Occupation: Unemployed    Comment: Prior customer service  Tobacco Use   Smoking status: Every Day    Packs/day: .5    Types: Cigarettes   Smokeless tobacco: Never  Vaping Use   Vaping Use: Never used  Substance and Sexual Activity   Alcohol use: No   Drug use: Yes    Types: Marijuana    Comment: several times per week to help with anxiety   Sexual activity: Not Currently  Other Topics Concern   Not on file  Social History Narrative   Right handed   Highest level of edu- HS    Lives in one story home with two roommates   Social Determinants of Health   Financial Resource Strain: Medium Risk (12/14/2021)   Overall Financial Resource Strain (CARDIA)    Difficulty of Paying Living Expenses: Somewhat hard  Food Insecurity: Food Insecurity Present (12/17/2021)   Hunger Vital Sign    Worried About Running Out of Food in the Last Year: Sometimes true    Ran Out of Food in the Last Year: Sometimes true  Transportation Needs: No Transportation Needs (12/14/2021)   PRAPARE - Administrator, Civil Service (Medical): No    Lack of Transportation (Non-Medical): No  Physical Activity: Inactive (12/14/2021)   Exercise Vital Sign    Days of Exercise per Week: 0 days    Minutes of Exercise per Session: 0 min  Stress: Stress Concern Present (12/14/2021)   [employer] of Occupational Health - Occupational Stress Questionnaire    Feeling of Stress : To some extent  Social Connections: Not on file    Review of Systems: A 12 point ROS discussed and pertinent positives are indicated in the HPI above.  All other systems are negative.  Review of Systems  Constitutional:  Negative for fatigue and fever.  HENT:  Negative for congestion.   Respiratory:  Negative for cough and shortness of breath.   Gastrointestinal:  Negative for abdominal pain, diarrhea, nausea and vomiting.  Neurological:  Positive for dizziness (when standing) and headaches.    Vital Signs: BP 99/67   Pulse 74   Temp (!) 97.4 F (36.3 C) (Temporal)   Resp 16   Ht 5\' 3"  (1.6 m)   Wt 140 lb (63.5 kg)   SpO2 97%   BMI 24.80 kg/m   Advance Care Plan: The advanced care plan/surrogate decision maker was discussed at the time of visit and the patient did not wish to discuss or was not able to name a surrogate decision maker or provide an advance care plan.    Physical Exam Vitals and nursing note reviewed.  Constitutional:      Appearance: Normal appearance. She is well-developed.   HENT:     Head: Normocephalic and atraumatic.  Eyes:     Conjunctiva/sclera: Conjunctivae normal.  Cardiovascular:     Rate and Rhythm: Normal rate and regular rhythm.     Heart sounds: Normal heart sounds.  Pulmonary:     Effort: Pulmonary effort is normal.     Breath sounds: Normal breath sounds.  Musculoskeletal:        General: Normal range of motion.     Cervical back: Normal range of motion.  Skin:    General: Skin is warm and dry.  Neurological:     General: No focal deficit present.     Mental Status: She is alert and oriented to person, place, and time.  Psychiatric:        Mood and Affect: Mood normal.        Behavior: Behavior normal.        Thought Content: Thought content normal.        Judgment: Judgment normal.     Imaging: CT ANGIO HEAD NECK W WO CM  Result Date: 10/25/2022 CLINICAL DATA:  Neuro deficit, acute, stroke suspected. Headache. Dizziness. Multiple falls. EXAM: CT HEAD WITHOUT CONTRAST CT ANGIOGRAPHY OF THE HEAD AND NECK TECHNIQUE: Contiguous axial images were obtained from the base of the skull through the vertex without intravenous contrast. Multidetector CT imaging of the head and neck was performed using the standard protocol during bolus administration of intravenous contrast. Multiplanar CT image reconstructions and MIPs were obtained to evaluate the vascular anatomy. Carotid stenosis measurements (when applicable) are obtained utilizing NASCET criteria, using the distal internal carotid diameter as the denominator. RADIATION DOSE REDUCTION: This exam was performed according to the departmental dose-optimization program which includes automated exposure control, adjustment of the mA and/or kV according to patient size and/or use of iterative reconstruction technique. CONTRAST:  75mL OMNIPAQUE IOHEXOL 350 MG/ML SOLN COMPARISON:  Head CT 11/29/2021.  MRI/MRA head 05/21/2022. FINDINGS: CT HEAD Brain: No acute intracranial hemorrhage. Unchanged  encephalomalacia in the right anterior temporal lobe  and subinsular region from prior hemorrhage. Mild chronic small-vessel disease. Otherwise, gray-white differentiation is preserved. No hydrocephalus or extra-axial collection. No mass effect or midline shift. Vascular: Right ICA terminus to right MCA flow diverting stent. Skull: Prior right pterional craniotomy. Sinuses/Orbits: Unremarkable. CTA NECK Aortic arch: Three-vessel arch configuration. Arch vessel origins are patent. Right carotid system: The common and internal carotid arteries are patent to the skull base without stenosis, aneurysm or dissection. Left carotid system: The common and internal carotid arteries are patent to the skull base without stenosis, aneurysm or dissection. Vertebral arteries: Patent from the origin to the confluence with the basilar without stenosis or dissection. Skeleton: Unremarkable. Other neck: Unremarkable. CTA HEAD Anterior circulation: Intracranial ICAs are patent without stenosis or aneurysm. Flow diverting stent across the right ICA terminus and proximal M1 segment of the right MCA. Small amount of residual enhancement of the excluded aneurysm neck (coronal image 180 series 12), unchanged from the most recent prior MRA. No other aneurysms of the MCAs or ACAs are seen. Distal branches are symmetric. Posterior circulation: Normal basilar artery. The SCAs, AICAs and PICAs are patent proximally. The PCAs are patent proximally without stenosis or aneurysm. Distal branches are symmetric. Venous sinuses: Patent. Anatomic variants: None. 3 IMPRESSION: 1. No acute intracranial process. Unchanged encephalomalacia in the right anterior temporal lobe and subinsular region from prior hemorrhage. 2. Flow diverting stent across the right ICA terminus and proximal M1 segment of the right MCA. Small amount of residual enhancement of the excluded aneurysm neck, unchanged from the most recent prior MRA. 3. No hemodynamically significant  stenosis in the neck. Electronically Signed   By: Orvan Falconer M.D.   On: 10/25/2022 14:43    Labs:  CBC: Recent Labs    06/13/22 1240 10/25/22 1330 11/22/22 0701  WBC 10.9* 5.4 6.0  HGB 14.1 13.5 13.4  HCT 41.9 41.2 41.7  PLT 167 152 137*    COAGS: Recent Labs    11/22/22 0701  INR 1.0    BMP: Recent Labs    06/13/22 1240 10/25/22 1330 11/22/22 0701  NA 136 136 137  K 4.4 3.9 4.2  CL 101 105 106  CO2 25 24 24   GLUCOSE 94 96 101*  BUN 11 11 12   CALCIUM 9.4 8.8* 9.2  CREATININE 0.73 0.73 0.76  GFRNONAA  --  >60 >60    LIVER FUNCTION TESTS: Recent Labs    06/13/22 1240  BILITOT 0.7  AST 26  ALT 11  ALKPHOS 94  PROT 7.1  ALBUMIN 4.2    Assessment and Plan:  67 y.o. Female outpatient. Smoker. History of biliary cholangitis, migraines, depression, HLD, anxiety, Arnold Chiari malformation,  anemia. Right MCA aneurysm s/p placement of 4.5 mm x 30 mm pipeline flex shield device by Dr. Fatima Sanger on 8.4.22. Presented to the ED at [facility] on 5.3.24 with headache, intermittent dizziness  X several weeks. CT Angio from 5.3.24 reads No acute intracranial process. Unchanged encephalomalacia in the right anterior temporal lobe and subinsular region from prior hemorrhage. Flow diverting stent across the right ICA terminus and proximal M1 segment of the right MCA. Small amount of residual enhancement of the excluded aneurysm neck, unchanged from the most recent prior MRA. Patient presents for diagnostic cerebral angiogram.   All labs and medications are within acceptable parameters. Allergies include codeine. Patient has been NPO since midnight.    Risks and benefits of cerebral angiogram were discussed with the patient including, but not limited to bleeding, infection, vascular injury  or contrast induced renal failure.  This interventional procedure involves the use of X-rays and because of the nature of the planned procedure, it is possible that we will have prolonged use  of X-ray fluoroscopy.  Potential radiation risks to you include (but are not limited to) the following: - A slightly elevated risk for cancer  several years later in life. This risk is typically less than 0.5% percent. This risk is low in comparison to the normal incidence of human cancer, which is 33% for women and 50% for men according to the American Cancer Society. - Radiation induced injury can include skin redness, resembling a rash, tissue breakdown / ulcers and hair loss (which can be temporary or permanent).   The likelihood of either of these occurring depends on the difficulty of the procedure and whether you are sensitive to radiation due to previous procedures, disease, or genetic conditions.   IF your procedure requires a prolonged use of radiation, you will be notified and given written instructions for further action.  It is your responsibility to monitor the irradiated area for the 2 weeks following the procedure and to notify your physician if you are concerned that you have suffered a radiation induced injury.    All of the patient's questions were answered, patient is agreeable to proceed.  Consent signed and in chart.     Thank you for this interesting consult.  I greatly enjoyed meeting Elizabeth Phelps and look forward to participating in their care.  A copy of this report was sent to the requesting provider on this date.  Electronically Signed: Alene Mires, NP 11/22/2022, 8:00 AM   I spent a total of  30 Minutes   in face to face in clinical consultation, greater than 50% of which was counseling/coordinating care for diagnostic cerebral angiogram

## 2022-11-22 ENCOUNTER — Ambulatory Visit (HOSPITAL_COMMUNITY)
Admission: RE | Admit: 2022-11-22 | Discharge: 2022-11-22 | Disposition: A | Discharge status: Home / Self Care | Payer: 59 | Source: Ambulatory Visit | Attending: Interventional Radiology | Admitting: Interventional Radiology

## 2022-11-22 ENCOUNTER — Other Ambulatory Visit (HOSPITAL_COMMUNITY): Payer: Self-pay | Admitting: Interventional Radiology

## 2022-11-22 ENCOUNTER — Other Ambulatory Visit: Payer: Self-pay

## 2022-11-22 DIAGNOSIS — I671 Cerebral aneurysm, nonruptured: Secondary | ICD-10-CM | POA: Diagnosis present

## 2022-11-22 DIAGNOSIS — F419 Anxiety disorder, unspecified: Secondary | ICD-10-CM | POA: Diagnosis not present

## 2022-11-22 DIAGNOSIS — F17210 Nicotine dependence, cigarettes, uncomplicated: Secondary | ICD-10-CM | POA: Diagnosis not present

## 2022-11-22 DIAGNOSIS — D649 Anemia, unspecified: Secondary | ICD-10-CM | POA: Insufficient documentation

## 2022-11-22 DIAGNOSIS — E785 Hyperlipidemia, unspecified: Secondary | ICD-10-CM | POA: Insufficient documentation

## 2022-11-22 DIAGNOSIS — F32A Depression, unspecified: Secondary | ICD-10-CM | POA: Diagnosis not present

## 2022-11-22 HISTORY — PX: IR US GUIDE VASC ACCESS RIGHT: IMG2390

## 2022-11-22 HISTORY — PX: IR ANGIO VERTEBRAL SEL VERTEBRAL BILAT MOD SED: IMG5369

## 2022-11-22 HISTORY — PX: IR ANGIO INTRA EXTRACRAN SEL COM CAROTID INNOMINATE BILAT MOD SED: IMG5360

## 2022-11-22 LAB — CBC WITH DIFFERENTIAL/PLATELET
Abs Immature Granulocytes: 0.03 10*3/uL (ref 0.00–0.07)
Basophils Absolute: 0 10*3/uL (ref 0.0–0.1)
Basophils Relative: 1 %
Eosinophils Absolute: 0.1 10*3/uL (ref 0.0–0.5)
Eosinophils Relative: 2 %
HCT: 41.7 % (ref 36.0–46.0)
Hemoglobin: 13.4 g/dL (ref 12.0–15.0)
Immature Granulocytes: 1 %
Lymphocytes Relative: 12 %
Lymphs Abs: 0.7 10*3/uL (ref 0.7–4.0)
MCH: 28.8 pg (ref 26.0–34.0)
MCHC: 32.1 g/dL (ref 30.0–36.0)
MCV: 89.7 fL (ref 80.0–100.0)
Monocytes Absolute: 0.8 10*3/uL (ref 0.1–1.0)
Monocytes Relative: 14 %
Neutro Abs: 4.3 10*3/uL (ref 1.7–7.7)
Neutrophils Relative %: 70 %
Platelets: 137 10*3/uL — ABNORMAL LOW (ref 150–400)
RBC: 4.65 MIL/uL (ref 3.87–5.11)
RDW: 14.1 % (ref 11.5–15.5)
WBC: 6 10*3/uL (ref 4.0–10.5)
nRBC: 0 % (ref 0.0–0.2)

## 2022-11-22 LAB — BASIC METABOLIC PANEL
Anion gap: 7 (ref 5–15)
BUN: 12 mg/dL (ref 8–23)
CO2: 24 mmol/L (ref 22–32)
Calcium: 9.2 mg/dL (ref 8.9–10.3)
Chloride: 106 mmol/L (ref 98–111)
Creatinine, Ser: 0.76 mg/dL (ref 0.44–1.00)
GFR, Estimated: >60 mL/min (ref >60)
Glucose, Bld: 101 mg/dL — ABNORMAL HIGH (ref 70–99)
Potassium: 4.2 mmol/L (ref 3.5–5.1)
Sodium: 137 mmol/L (ref 135–145)

## 2022-11-22 LAB — PROTIME-INR
INR: 1 (ref 0.8–1.2)
Prothrombin Time: 13.5 seconds (ref 11.4–15.2)

## 2022-11-22 IMAGING — MG DIGITAL SCREENING BILAT W/ CAD
4 series · 4 of 4 positions shown · non-contrast
Comparison: Previous exam(s).

CLINICAL DATA: Screening.

EXAM:
DIGITAL SCREENING BILATERAL MAMMOGRAM WITH CAD
TECHNIQUE: Bilateral screening digital craniocaudal and mediolateral oblique
mammograms were obtained. The images were evaluated with
computer-aided detection.

[R MLO]
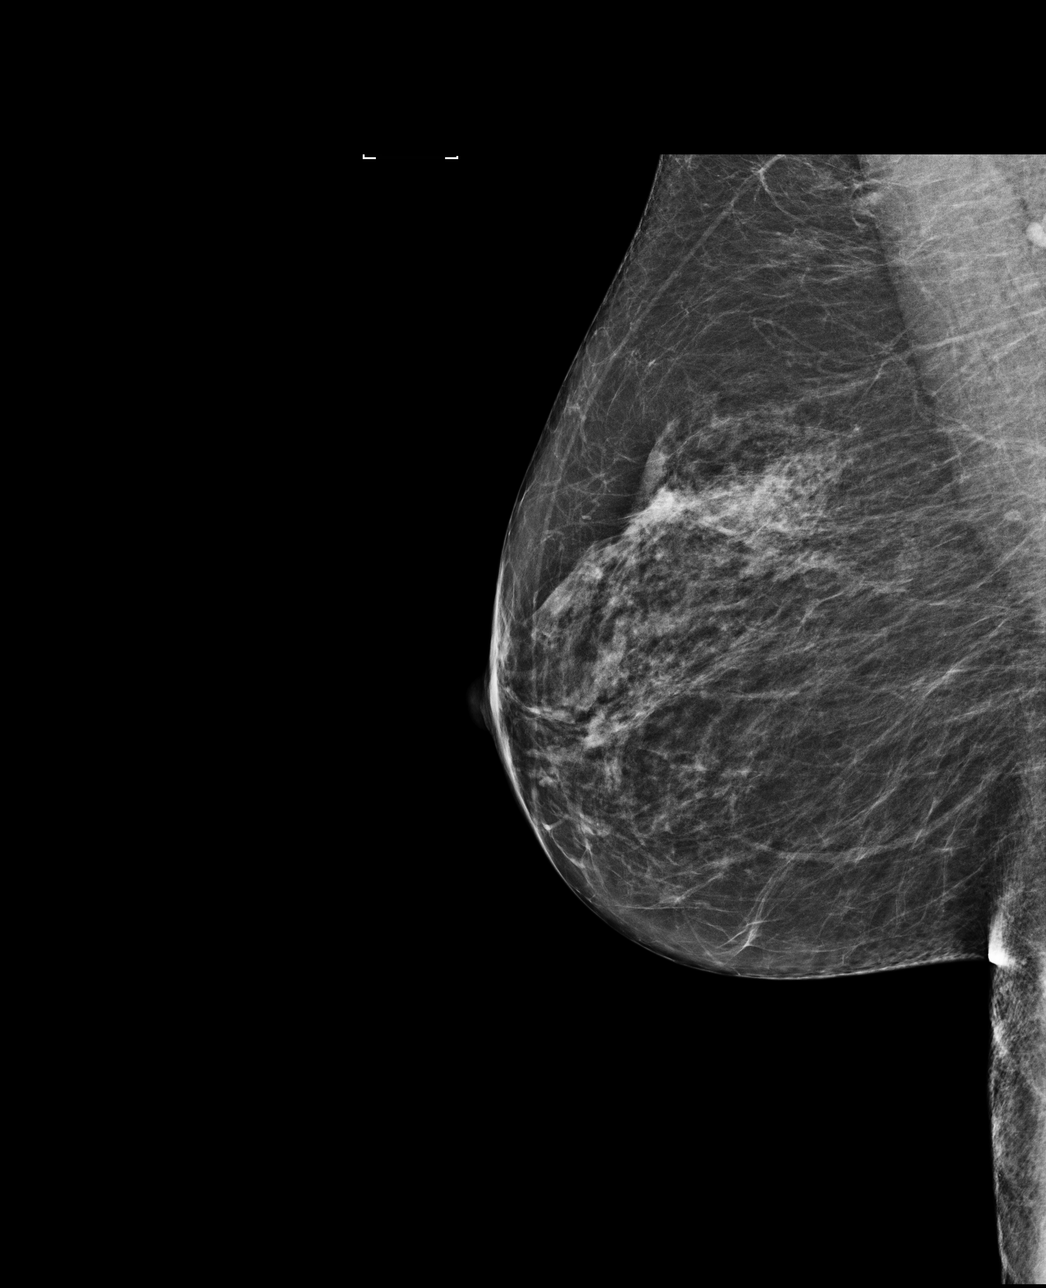

[L CC]
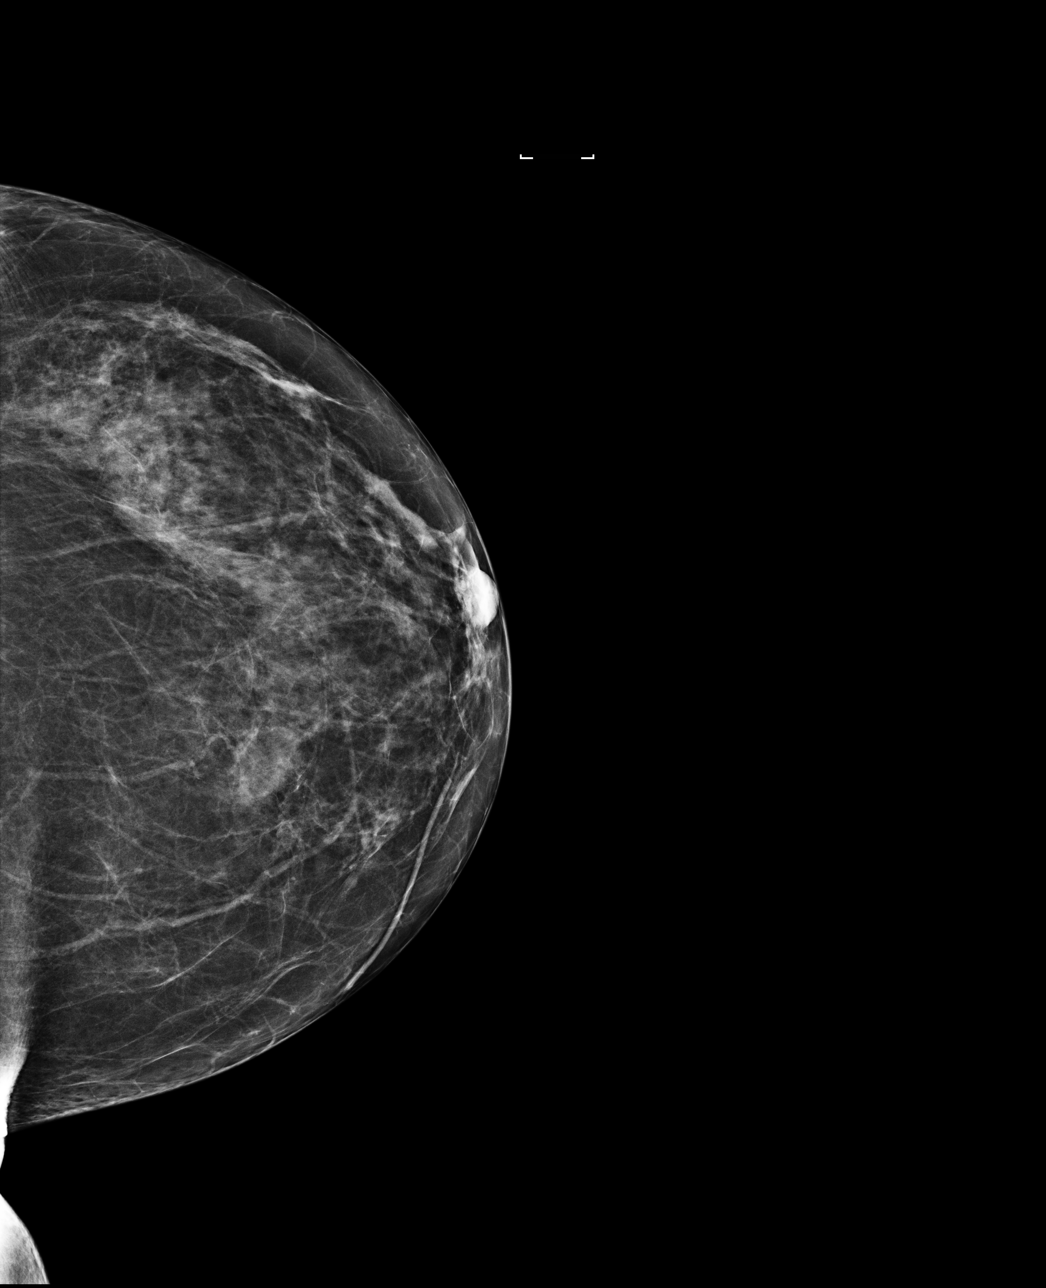

[R CC]
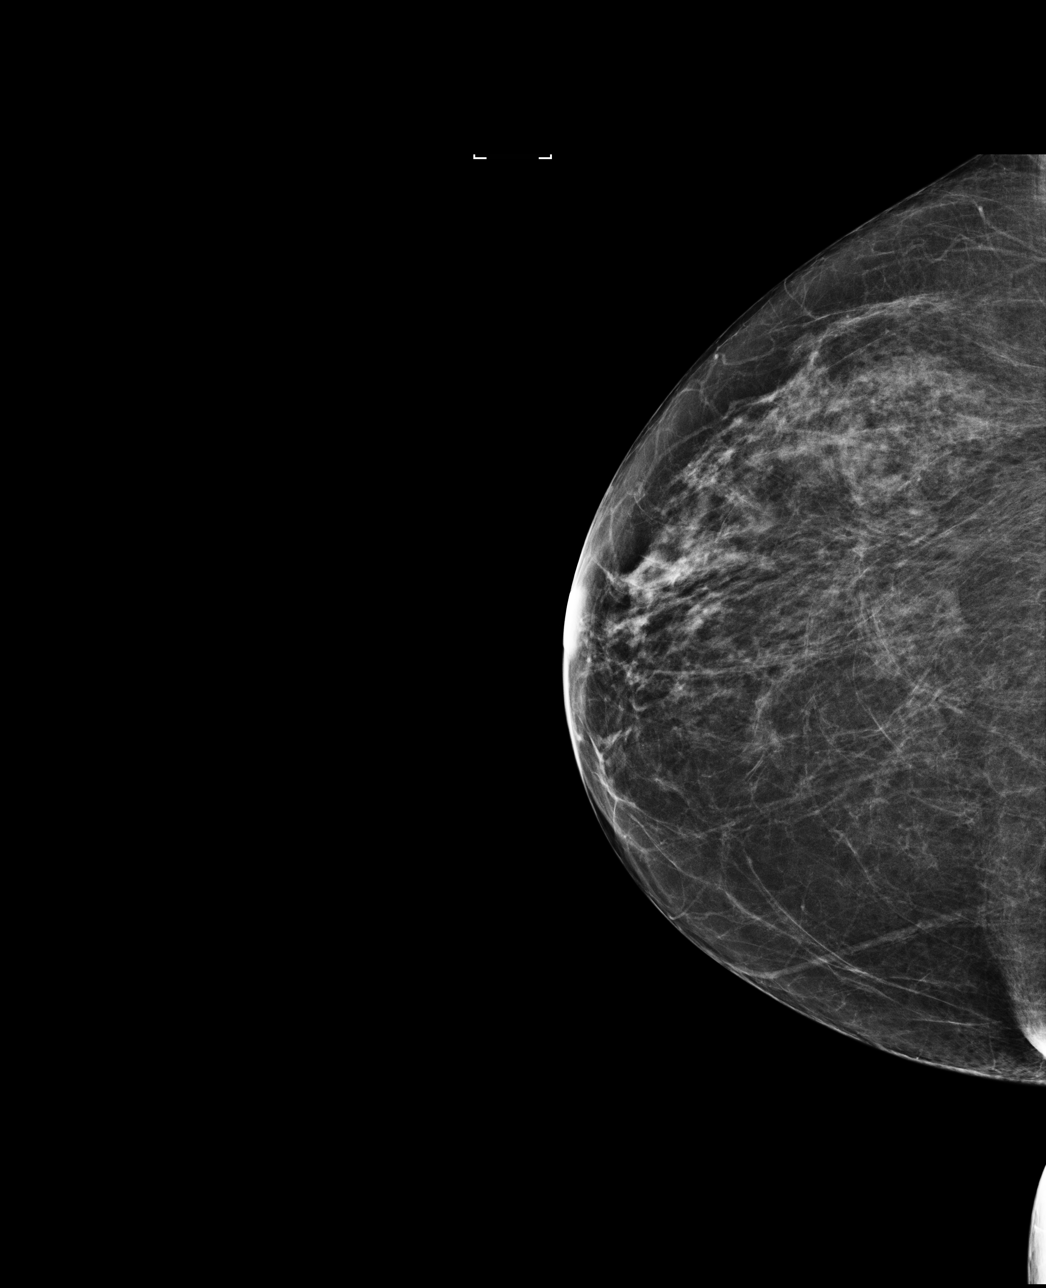

[L MLO]
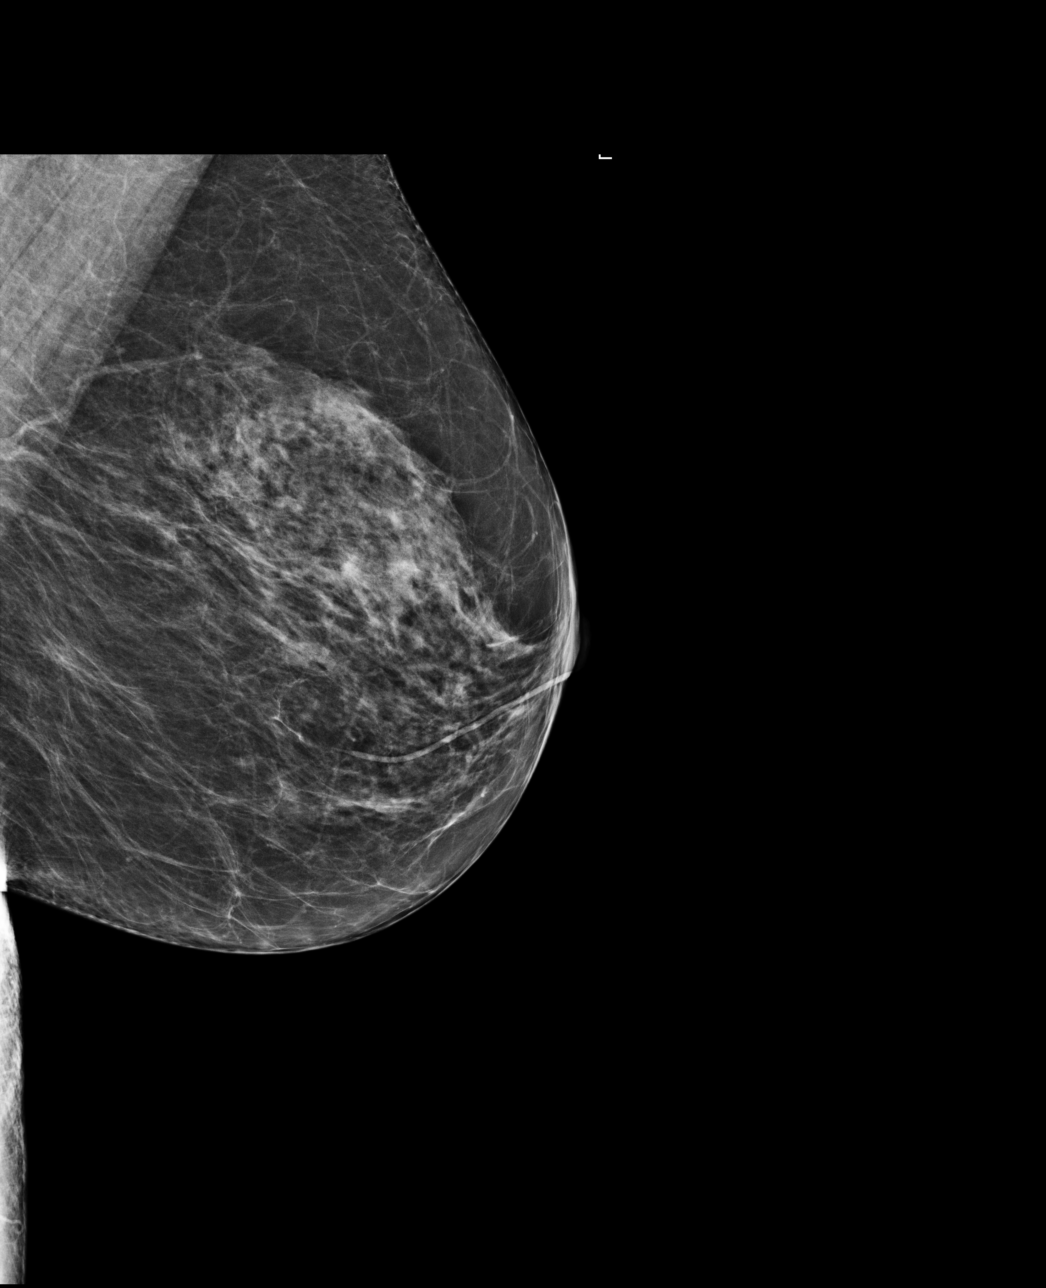

[4 of 4 positions shown; findings below may reference images not displayed]

ACR Breast Density Category c: The breast tissue is heterogeneously
dense, which may obscure small masses.
FINDINGS: There are no findings suspicious for malignancy.
IMPRESSION: No mammographic evidence of malignancy. A result letter of this
screening mammogram will be mailed directly to the patient.

RECOMMENDATION:
Screening mammogram in one year. (Code:TJ-B-ZPA)

BI-RADS CATEGORY  1: Negative.

## 2022-11-22 MED ORDER — VERAPAMIL HCL 2.5 MG/ML IV SOLN
INTRA_ARTERIAL | Status: AC | PRN
  Administered 2022-11-22: 8 mL via INTRA_ARTERIAL

## 2022-11-22 MED ORDER — SODIUM CHLORIDE 0.9 % IV SOLN: INTRAVENOUS | Status: AC

## 2022-11-22 MED ORDER — FENTANYL CITRATE (PF) 100 MCG/2ML IJ SOLN
INTRAMUSCULAR | Status: AC
  Filled 2022-11-22: qty 2

## 2022-11-22 MED ORDER — HEPARIN SODIUM (PORCINE) 1000 UNIT/ML IJ SOLN
INTRAMUSCULAR | Status: AC
  Filled 2022-11-22: qty 10

## 2022-11-22 MED ORDER — FENTANYL CITRATE (PF) 100 MCG/2ML IJ SOLN
INTRAMUSCULAR | Status: AC | PRN
  Administered 2022-11-22: 25 ug via INTRAVENOUS

## 2022-11-22 MED ORDER — IOHEXOL 300 MG/ML  SOLN: 150.0000 mL | Freq: Once | INTRAMUSCULAR | Status: DC | PRN

## 2022-11-22 MED ORDER — MIDAZOLAM HCL 2 MG/2ML IJ SOLN
INTRAMUSCULAR | Status: AC
  Filled 2022-11-22: qty 2

## 2022-11-22 MED ORDER — SODIUM CHLORIDE 0.9 % IV BOLUS
INTRAVENOUS | Status: AC | PRN
  Administered 2022-11-22: 250 mL via INTRAVENOUS

## 2022-11-22 MED ORDER — SODIUM CHLORIDE 0.9 % IV SOLN: INTRAVENOUS | Status: DC

## 2022-11-22 MED ORDER — VERAPAMIL HCL 2.5 MG/ML IV SOLN
INTRAVENOUS | Status: AC
  Filled 2022-11-22: qty 2

## 2022-11-22 MED ORDER — NITROGLYCERIN 1 MG/10 ML FOR IR/CATH LAB
INTRA_ARTERIAL | Status: AC
  Filled 2022-11-22: qty 10

## 2022-11-22 MED ORDER — LIDOCAINE HCL 1 % IJ SOLN
INTRAMUSCULAR | Status: AC
  Filled 2022-11-22: qty 20

## 2022-11-22 MED ORDER — MIDAZOLAM HCL 2 MG/2ML IJ SOLN
INTRAMUSCULAR | Status: AC | PRN
  Administered 2022-11-22: 1 mg via INTRAVENOUS

## 2022-11-22 NOTE — Procedures (Signed)
INR.  Status post four-vessel cerebral arteriogram.  Right radial approach.  Findings.  1.  Nearly completely obliterated previously endovascularly treated right middle cerebral artery complex aneurysm with mild stenosis in the mid segment of the flow diverter construct.  2.  Venous outflow intact.  Fatima Sanger MD.

## 2022-11-22 NOTE — Progress Notes (Signed)
TR BAND REMOVAL  LOCATION:    right radial  DEFLATED PER PROTOCOL:    Yes.    TIME BAND OFF / DRESSING APPLIED: 11/22/22 at 1200   SITE UPON ARRIVAL:    Level 0  SITE AFTER BAND REMOVAL:    Level 0  CIRCULATION SENSATION AND MOVEMENT:    Within Normal Limits   Yes  COMMENTS:

## 2022-12-11 ENCOUNTER — Telehealth: Payer: Self-pay | Admitting: Family Medicine

## 2022-12-11 NOTE — Telephone Encounter (Signed)
Tried calling patient. Left message to call back. 

## 2022-12-11 NOTE — Telephone Encounter (Signed)
Pt is scheduled to come see you on the 25 th and she is saying she was told she would need blood work? She says she has had numerous blood work in the past few weeks but wants to make sure if she will need to be fasting when she comes for your appointment?

## 2022-12-12 NOTE — Telephone Encounter (Signed)
Advised pt of fasting

## 2022-12-17 ENCOUNTER — Ambulatory Visit (INDEPENDENT_AMBULATORY_CARE_PROVIDER_SITE_OTHER): Payer: 59 | Admitting: Neurology

## 2022-12-17 ENCOUNTER — Encounter: Payer: Self-pay | Admitting: Neurology

## 2022-12-17 ENCOUNTER — Ambulatory Visit (INDEPENDENT_AMBULATORY_CARE_PROVIDER_SITE_OTHER): Payer: 59 | Admitting: Family Medicine

## 2022-12-17 ENCOUNTER — Encounter: Payer: Self-pay | Admitting: Family Medicine

## 2022-12-17 VITALS — BP 115/71 | HR 89 | Ht 63.5 in | Wt 148.4 lb

## 2022-12-17 VITALS — BP 110/64 | HR 88 | Temp 98.3°F | Resp 16 | Wt 147.8 lb

## 2022-12-17 DIAGNOSIS — Z23 Encounter for immunization: Secondary | ICD-10-CM

## 2022-12-17 DIAGNOSIS — F411 Generalized anxiety disorder: Secondary | ICD-10-CM | POA: Diagnosis not present

## 2022-12-17 DIAGNOSIS — G43109 Migraine with aura, not intractable, without status migrainosus: Secondary | ICD-10-CM

## 2022-12-17 DIAGNOSIS — I700 Atherosclerosis of aorta: Secondary | ICD-10-CM | POA: Diagnosis not present

## 2022-12-17 DIAGNOSIS — R42 Dizziness and giddiness: Secondary | ICD-10-CM | POA: Diagnosis not present

## 2022-12-17 LAB — LIPID PANEL
Chol/HDL Ratio: 3.4 ratio (ref 0.0–4.4)
Cholesterol, Total: 173 mg/dL (ref 100–199)
HDL: 51 mg/dL (ref >39)
LDL Chol Calc (NIH): 93 mg/dL (ref 0–99)
Triglycerides: 167 mg/dL — ABNORMAL HIGH (ref 0–149)
VLDL Cholesterol Cal: 29 mg/dL (ref 5–40)

## 2022-12-17 MED ORDER — PAROXETINE HCL 20 MG PO TABS: 20.0000 mg | ORAL_TABLET | Freq: Every day | ORAL | 1 refills | Status: DC

## 2022-12-17 MED ORDER — ATORVASTATIN CALCIUM 40 MG PO TABS: 40.0000 mg | ORAL_TABLET | Freq: Every day | ORAL | 0 refills | Status: DC

## 2022-12-17 NOTE — Progress Notes (Signed)
   Subjective:    Patient ID: Elizabeth Phelps, female    DOB: 23-Feb-1956, 67 y.o.   MRN: [id number]  HPI She is here for an interval evaluation.  She recently had an angiogram done which apparently showed no aneurysm but did show 1 area of thinning of the vein.  She will continue to be monitored by neuroradiology for that.  She continues to be followed by [facility] through [facility] and seem to be doing well with that.  She has had some difficulty with dizzy spells but otherwise no particular concerns or complaints.  Continues on Lipitor as well as Paxil and Plavix.  In general she seems to be doing quite nicely.  She was supposed of had a sleep study but has not gotten that done for further evaluation of a previous history of migraines.   Review of Systems     Objective:    Physical Exam  Alert and in no distress. Tympanic membranes and canals are normal. Pharyngeal area is normal. Neck is supple without adenopathy or thyromegaly. Cardiac exam shows a regular sinus rhythm without murmurs or gallops. Lungs are clear to auscultation.       Assessment & Plan:   Problem List Items Addressed This Visit     Atherosclerosis of aorta   Relevant Medications   atorvastatin (LIPITOR) 40 MG tablet   Other Relevant Orders   Lipid Panel   Generalized anxiety disorder   Relevant Medications   PARoxetine (PAXIL) 20 MG tablet   Other Visit Diagnoses     Need for vaccination against Streptococcus pneumoniae    -  Primary   Relevant Orders   Pneumococcal conjugate vaccine 20-valent (Prevnar 20)     Also encouraged her to get Shingrix and RSV.  Pneumococcal vaccine given.  Encouraged her to follow-up with neurology and get the sleep study done.  She will continue to be monitored by neuroradiology as well as hepatology.

## 2022-12-17 NOTE — Progress Notes (Signed)
NEUROLOGY FOLLOW UP OFFICE NOTE  HARLEI LEHRMANN [id number] 05/16/1956  HISTORY OF PRESENT ILLNESS: I had the pleasure of seeing Alisson Rozell in follow-up in the neurology clinic on 12/17/2022.  The patient was last seen 8 months ago. She is alone in the office today. Records and images were personally reviewed where available. Since her last visit, she had called our office last month to report a fall at the end of April where she picked up her laundry basket, felt a sensation of movement, went around the bed then fell face first. She required stitches on her chin. She went to the ER where CTA head and neck did not show any acute changes. It was felt she was having some mild orthostatic changes. She had a cerebral angiogram on 11/23/22 which showed angiographically nearly completely obliterated previously noted right middle cerebral artery fusiform aneurysm with saccular component by flow diversion, mild intra construct stenosis in its mid segment. She is happy to report that angiogram showed good results. The dizziness has gotten a little bit better, she has to be careful with head movements. She notices symptoms when she gets in bed, she lays down then for a brief few minutes the room spins. She would be sitting and turn her head then everything moves. No further falls since end of April. The headaches are better, she has not been taking Zomig because it would knock her out for 5 hours after. She has been taking prn Ibuprofen 2-3 times a month, drinks coffee, lays down with a hot rag across her face. We had previously discussed doing a sleep study, however she notes that after staying with a friend for 2 weeks, she was told she does not snore. She mostly has daytime drowsiness when in a dark cozy room and has not noticed a lot in the past 2 weeks. She notes that she has been staying with a friend these past 2 weeks and her friend has not heard her snore. She has cut down a lot on smoking. She is driving.  She notes mood is good  as long as her kids leave her alone.    History on Initial Assessment 09/08/2018: This is a pleasant 67 year old right-handed woman with a history of hyperlipidemia, tobacco use, anxiety, migraine with aura, right MCA aneurysm s/p craniotomy, presenting for evaluation of worsening memory. She started having memory issues after her brain surgery in 2002, however she continued to function quite well and states she has always been on top of things until around October 2019. At that time, she started noticing word-finding difficulties, Rolly Salter has noticed this as well, she would stop mid-sentence unable to remember a word. She would hear something but would not comprehend what people are saying, asking them to repeat it several times. She feels like her brain is "short-circuiting." She reports her long-term memory is "awesome" but she cannot remember what she ate yesterday. She has gotten lost driving and uses her GPS all the time even for familiar roads. 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 There were 3 days last week where she was driving to work then she suddenly realizes she is there but does not remember the trip. Rolly Salter was talking to her one time while patient was driving then realized she was at work, and did not notice any confusion. 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 She states numbers have always been her thing but now she cannot do math anymore. She lives with 2 roommates who were the first ones to notice how forgetful she has been. She has been told she is "off somewhere" or "zoning out" and would need to be called a couple of times to get her attention. She has noticed she gets frustrated and agitated more easily. She was anxious after her brain  surgery in 2002 and was started on Paxil which helped, she does not feel it is helping much now.  She has also started having involuntary left hand shaking lasting a few minutes. Leg is unaffected. There is no associated speech difficulty, confusion, or weakness when she has the shaking. She has numbness and tingling in her finger tips. She has been having more headaches since October, with good response to Excedrin. She has dizziness when she gets up from bed in the morning or in the middle of the night, this is when she usually falls. Last fall was a week ago. She has noticed that if she looks at something sometimes, she sees something different, then looks again and sees what the object actually is. No olfactory/gustatory hallucinations. She has nausea when dizzy, but also has waves of nausea throughout the day. Sleep is fine most of the time. She has not woken up with tongue bite or incontinence. Her mother had memory issues after several strokes. Her father had a history of cerebral aneurysm as well. She had a head injury in the 1980s. No alcohol use. There is no family history of seizures.   MRI brain without contrast in January 2017 showed encephalomalacia of the right temporal lobe, right lateral lenticulostriate territory chronic infarct affecting the external capsule, periventricular white matter, patulous right temporal horn. Cerebellar tonsils are 13mm below foramen magnum. MRA head showed combination fusiform and saccular aneurysm of the right MCA throughout M1 segment, appearing similar in size to angiogram in 2002.   Repeat MRI/MRA brain in 05/2019 showed no acute changes. There were post-op changes from right pterional craniotomy with underlying chronic post-operative and/or ischemic encephalomalacia in the right temporal lobe and right basal ganglia. There is a Chiari 1 malfomation with tonsils extending up to 12mm through foramen magnum, stable from prior. There is diffuse atrophy and chronic  microvascular disease, mildly progressed from 2017. MRA showed combination of fusiform and saccular right MCA aneurysm originating from the right M1 segment, similar to 2017 imaging.   Neurocognitive testing was done in March 2021. There was performance variability as well as concerns surrounding validity of lower test scores. Deficits in visuoperceptual functioning are consistent with history of right temporal lobe encephalomalacia. There was no pronounced right frontal cognitive dysfunction seen. There was variability across processing speed, executive functioning, and visuospatial abilities, as well as learning and memory. She scored moderate range for acute symptoms of anxiety and depression. She also scored in the "likely to have ADHD" range across a childhood assessment of inattentive symptoms. It was noted that "There is the potential that the primary etiology of her cognitive deficits surrounds underlying traits of ADHD, exacerbated by significant psychiatric distress. This could manifest in trouble with processing speed and receptive language, worsening her ability to problem solve, adapt to changing cognitive demands, and learn novel information. However, I cannot rule out an underlying cognitive disorder at the present time."  EEG done 06/2019 showed occasional slowing over the right frontal and temporal regions.   Prior headache preventative medication: gabapentin Prior headache rescue medication: imitrex, zomig, Maxalt (needed 2 doses, drowsiness)   PAST MEDICAL HISTORY: Past Medical History:  Diagnosis Date   Anemia    Arnold-Chiari malformation, type I    Atherosclerosis of aorta 10/02/2017   Attention and concentration deficit 09/02/2019   traits of ADHD at very least   Autoimmune hepatitis 08/04/2018   Patient with PBC and autoimmune hepatitis overlap on previous biopsy.   Patient has a negative AMA, ANA was positive in a dual nuclear and speckled pattern with a titer of 1:1280,  ASMA was weakly positive at 27, and a mildly elevated IgG. Biopsy reported mild to moderate interface hepatitis.  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   Complication of anesthesia    hard to wake up after brain surgery in 2002   Diverticulosis 07/21/2020   Dysthymia 04/03/2012   Generalized anxiety disorder    History of colonic polyps 08/12/2016   Hyperlipidemia 08/12/2016   LDL goal <100   Incomplete right bundle branch block 09/22/2017   Major depressive disorder    Migraine headache    Primary biliary cholangitis 08/04/2018   Primary biliary cirrhosis    Smoker     MEDICATIONS: Current Outpatient Medications on File Prior to Visit  Medication Sig Dispense Refill   aspirin EC 81 MG tablet Take 81 mg by mouth daily. Swallow whole.     azaTHIOprine (IMURAN) 50 MG tablet Take 100 mg by mouth at bedtime.     clopidogrel (PLAVIX) 75 MG tablet Take 1 tablet (75 mg total) by mouth daily. 90 tablet 3   ibuprofen (ADVIL) 200 MG tablet Take 400 mg by mouth every 6 (six) hours as needed for headache.     Multiple Vitamin (MULTIVITAMIN WITH MINERALS) TABS Take 1 tablet by mouth at bedtime.     OCALIVA 5 MG TABS Take 5 mg by mouth at bedtime.     polyethylene glycol (MIRALAX / GLYCOLAX) 17 g packet Take 17 g by mouth daily.     zolmitriptan (ZOMIG) 5 MG tablet Take 1 tablet (5 mg total) by mouth as needed for migraine. 10 tablet 11   No current facility-administered medications on file prior to visit.    ALLERGIES: Allergies  Allergen Reactions   Codeine Nausea And Vomiting    FAMILY HISTORY: Family History  Problem Relation Age of Onset   Stroke Mother    Hypertension Mother    Prostate cancer Father        mets   Aneurysm Father    Breast cancer Maternal Grandmother    Colon cancer Neg Hx    Stomach cancer Neg Hx    Esophageal cancer Neg Hx     SOCIAL HISTORY: Social  History   Socioeconomic History   Marital status: Divorced    Spouse name: Not on file   Number of children: 2   Years of education: 12   Highest education level: High school graduate  Occupational History   Occupation: Unemployed    Comment: Prior customer service  Tobacco Use   Smoking status: Some Days    Packs/day: .5    Types: Cigarettes   Smokeless tobacco: Never  Vaping Use   Vaping Use: Never used  Substance and Sexual Activity   Alcohol use: No  Drug use: Yes    Types: Marijuana    Comment: several times per week to help with anxiety   Sexual activity: Not Currently  Other Topics Concern   Not on file  Social History Narrative   Right handed   Highest level of edu- HS   Lives in one story home with two roommates   Social Determinants of Health   Financial Resource Strain: Medium Risk (12/14/2021)   Overall Financial Resource Strain (CARDIA)    Difficulty of Paying Living Expenses: Somewhat hard  Food Insecurity: Food Insecurity Present (12/17/2021)   Hunger Vital Sign    Worried About Running Out of Food in the Last Year: Sometimes true    Ran Out of Food in the Last Year: Sometimes true  Transportation Needs: No Transportation Needs (12/14/2021)   PRAPARE - Administrator, Civil Service (Medical): No    Lack of Transportation (Non-Medical): No  Physical Activity: Inactive (12/14/2021)   Exercise Vital Sign    Days of Exercise per Week: 0 days    Minutes of Exercise per Session: 0 min  Stress: Stress Concern Present (12/14/2021)   [employer] of Occupational Health - Occupational Stress Questionnaire    Feeling of Stress : To some extent  Social Connections: Not on file  Intimate Partner Violence: Not on file     PHYSICAL EXAM: Vitals:   12/17/22 1435  BP: 115/71  Pulse: 89  SpO2: 97%   General: No acute distress, less anxious with leg bouncing Head:  Normocephalic/atraumatic Skin/Extremities: No rash, no edema Neurological Exam:  alert and awake. No aphasia or dysarthria. Fund of knowledge is appropriate.  Attention and concentration are normal.   Cranial nerves: Pupils equal, round. Extraocular movements intact with no nystagmus. Visual fields full reporting right blurred vision.  No facial asymmetry.  Motor: Bulk and tone normal, muscle strength 5/5 throughout with no pronator drift.   Finger to nose testing intact.  Gait narrow-based and steady, able to tandem walk adequately.  Romberg negative.   IMPRESSION: This is a 68 yo RH woman with a history of  hyperlipidemia, tobacco use, anxiety, migraine with aura, right MCA aneurysm s/p craniotomy and recent endovascular treatment in 01/2021, who presented with memory changes and headaches. Repeat Neuropsychological evaluation did not show any evidence of a neurodegenerative process, subjective memory complaints likely due to underlying traits for ADHD, exacerbated by significant psychiatric distress and disrupted sleep. She was having positional vertigo, no falls since April. Refer for Vestibular therapy. She was advised to see an eye doctor for the blurred vision and also have a hearing evaluation. She reports headaches are better as well. She feels good overall and feels a sleep study is not needed at this time. Follow-up in 6 months, call for any changes.    Thank you for allowing me to participate in her care.  Please do not hesitate to call for any questions or concerns.    Patrcia Dolly, M.D.

## 2022-12-17 NOTE — Patient Instructions (Addendum)
Good to see you.  Referral will be sent for Vestibular therapy  2. Please check with your insurance on which eye doctor is under your plan.   3. Recommend hearing evaluation  4. Follow-up in 6 months, call for any changes

## 2023-01-14 ENCOUNTER — Other Ambulatory Visit: Payer: Self-pay | Admitting: Nurse Practitioner

## 2023-01-14 DIAGNOSIS — K743 Primary biliary cirrhosis: Secondary | ICD-10-CM

## 2023-01-14 DIAGNOSIS — K754 Autoimmune hepatitis: Secondary | ICD-10-CM

## 2023-01-21 ENCOUNTER — Other Ambulatory Visit: Payer: 59

## 2023-01-29 ENCOUNTER — Other Ambulatory Visit: Payer: 59

## 2023-02-03 ENCOUNTER — Ambulatory Visit
Admission: RE | Admit: 2023-02-03 | Discharge: 2023-02-03 | Disposition: A | Discharge status: Home / Self Care | Payer: 59 | Source: Ambulatory Visit | Attending: Nurse Practitioner | Admitting: Nurse Practitioner

## 2023-02-03 DIAGNOSIS — K754 Autoimmune hepatitis: Secondary | ICD-10-CM

## 2023-02-03 DIAGNOSIS — K743 Primary biliary cirrhosis: Secondary | ICD-10-CM

## 2023-03-13 ENCOUNTER — Encounter: Payer: Self-pay | Admitting: Family Medicine

## 2023-03-13 ENCOUNTER — Ambulatory Visit (INDEPENDENT_AMBULATORY_CARE_PROVIDER_SITE_OTHER): Payer: 59 | Admitting: Family Medicine

## 2023-03-13 VITALS — BP 138/78 | HR 61 | Temp 98.6°F | Wt 149.0 lb

## 2023-03-13 DIAGNOSIS — U071 COVID-19: Secondary | ICD-10-CM

## 2023-03-13 DIAGNOSIS — R051 Acute cough: Secondary | ICD-10-CM

## 2023-03-13 MED ORDER — ALBUTEROL SULFATE HFA 108 (90 BASE) MCG/ACT IN AERS
2.0000 | INHALATION_SPRAY | Freq: Four times a day (QID) | RESPIRATORY_TRACT | 0 refills | Status: DC | PRN
Start: 2023-03-13 — End: 2024-03-30

## 2023-03-13 NOTE — Progress Notes (Signed)
Subjective:    Patient ID: Elizabeth Phelps, female    DOB: 13-Aug-1955, 67 y.o.   MRN: [id number]  HPI She states that on September 7 she started develop a chest congestion, coughing, sore throat and did test for COVID and was positive.  She has had continued difficulty with coughing since then as well as some shortness of breath but the cough is nonproductive.  The coughing sometimes causes her to have a headache.   Review of Systems     Objective:    Physical Exam Alert and in no distress. Tympanic membranes and canals are normal. Pharyngeal area is normal. Neck is supple without adenopathy or thyromegaly. Cardiac exam shows a regular sinus rhythm without murmurs or gallops. Lungs show expiratory rhonchi..        Assessment & Plan:   Problem List Items Addressed This Visit   None Visit Diagnoses     COVID-19    -  Primary   Relevant Medications   albuterol (VENTOLIN HFA) 108 (90 Base) MCG/ACT inhaler   Acute cough       Relevant Medications   albuterol (VENTOLIN HFA) 108 (90 Base) MCG/ACT inhaler     She is to use the inhaler as needed.  If she still has symptoms in 1 week, she is to call for possible use of antibiotic.

## 2023-03-15 ENCOUNTER — Other Ambulatory Visit: Payer: Self-pay | Admitting: Family Medicine

## 2023-03-15 DIAGNOSIS — F411 Generalized anxiety disorder: Secondary | ICD-10-CM

## 2023-03-17 NOTE — Telephone Encounter (Signed)
Last apt 03/13/23.

## 2023-04-01 ENCOUNTER — Other Ambulatory Visit: Payer: Self-pay | Admitting: Family Medicine

## 2023-04-01 DIAGNOSIS — R051 Acute cough: Secondary | ICD-10-CM

## 2023-04-01 DIAGNOSIS — U071 COVID-19: Secondary | ICD-10-CM

## 2023-04-08 ENCOUNTER — Ambulatory Visit (INDEPENDENT_AMBULATORY_CARE_PROVIDER_SITE_OTHER): Payer: 59

## 2023-04-08 DIAGNOSIS — Z0000 Encounter for general adult medical examination without abnormal findings: Secondary | ICD-10-CM

## 2023-04-08 NOTE — Progress Notes (Signed)
Subjective:   Elizabeth Phelps is a 67 y.o. female who presents for Medicare Annual (Subsequent) preventive examination.  Visit Complete: Virtual I connected with  Elizabeth Phelps on 04/08/23 by a audio enabled telemedicine application and verified that I am speaking with the correct person using two identifiers.  Patient Location: Home  Provider Location: Office/Clinic  I discussed the limitations of evaluation and management by telemedicine. The patient expressed understanding and agreed to proceed.  Vital Signs: Because this visit was a virtual/telehealth visit, some criteria may be missing or patient reported. Any vitals not documented were not able to be obtained and vitals that have been documented are patient reported.  Patient Medicare AWV questionnaire was completed by the patient on 04/08/2023; I have confirmed that all information answered by patient is correct and no changes since this date.  Cardiac Risk Factors include: advanced age (>29men, >39 women);dyslipidemia     Objective:    Today's Vitals   04/08/23 1411  PainSc: 2    There is no height or weight on file to calculate BMI.     04/08/2023    2:22 PM 12/17/2022    2:40 PM 11/22/2022    7:12 AM 10/25/2022   12:50 PM 04/17/2022    3:06 PM 04/15/2022    1:10 PM 12/14/2021   11:09 AM  Advanced Directives  Does Patient Have a Medical Advance Directive? No No No No No No No  Would patient like information on creating a medical advance directive?   No - Patient declined   No - Patient declined     Current Medications (verified) Outpatient Encounter Medications as of 04/08/2023  Medication Sig   albuterol (VENTOLIN HFA) 108 (90 Base) MCG/ACT inhaler Inhale 2 puffs into the lungs every 6 (six) hours as needed for wheezing or shortness of breath.   aspirin EC 81 MG tablet Take 81 mg by mouth daily. Swallow whole.   atorvastatin (LIPITOR) 40 MG tablet Take 1 tablet (40 mg total) by mouth daily.   azaTHIOprine  (IMURAN) 50 MG tablet Take 100 mg by mouth at bedtime.   clopidogrel (PLAVIX) 75 MG tablet Take 1 tablet (75 mg total) by mouth daily.   ibuprofen (ADVIL) 200 MG tablet Take 400 mg by mouth every 6 (six) hours as needed for headache.   Multiple Vitamin (MULTIVITAMIN WITH MINERALS) TABS Take 1 tablet by mouth at bedtime.   OCALIVA 5 MG TABS Take 5 mg by mouth at bedtime.   omeprazole (PRILOSEC) 20 MG capsule Take 20 mg by mouth every other day.   PARoxetine (PAXIL) 20 MG tablet Take 1 tablet by mouth once daily   polyethylene glycol (MIRALAX / GLYCOLAX) 17 g packet Take 17 g by mouth daily.   No facility-administered encounter medications on file as of 04/08/2023.    Allergies (verified) Codeine   History: Past Medical History:  Diagnosis Date   Anemia    Arnold-Chiari malformation, type I    Atherosclerosis of aorta 10/02/2017   Attention and concentration deficit 09/02/2019   traits of ADHD at very least   Autoimmune hepatitis 08/04/2018   Patient with PBC and autoimmune hepatitis overlap on previous biopsy.   Patient has a negative AMA, ANA was positive in a dual nuclear and speckled pattern with a titer of 1:1280, ASMA was weakly positive at 27, and a mildly elevated IgG. Biopsy reported mild to moderate interface hepatitis.  She is currently on azathioprine 100 mg dail   Cerebral aneurysm without rupture  Combination of fusiform and saccular right MCA aneurysm   Complication of anesthesia    hard to wake up after brain surgery in 2002   Diverticulosis 07/21/2020   Dysthymia 04/03/2012   Generalized anxiety disorder    History of colonic polyps 08/12/2016   Hyperlipidemia 08/12/2016   LDL goal <100   Incomplete right bundle branch block 09/22/2017   Major depressive disorder    Migraine headache    Primary biliary cholangitis 08/04/2018   Primary biliary cirrhosis    Smoker    Past Surgical History:  Procedure Laterality Date   APPENDECTOMY     BRAIN SURGERY      BREAST EXCISIONAL BIOPSY Left    CATARACT EXTRACTION Right 2018   COLONOSCOPY  2007   Dr. Loreta Ave   IR ANGIO INTRA EXTRACRAN SEL COM CAROTID INNOMINATE BILAT MOD SED  01/04/2021   IR ANGIO INTRA EXTRACRAN SEL COM CAROTID INNOMINATE BILAT MOD SED  11/22/2022   IR ANGIO INTRA EXTRACRAN SEL INTERNAL CAROTID UNI R MOD SED  01/24/2021   IR ANGIO VERTEBRAL SEL SUBCLAVIAN INNOMINATE UNI L MOD SED  01/04/2021   IR ANGIO VERTEBRAL SEL VERTEBRAL BILAT MOD SED  11/22/2022   IR ANGIO VERTEBRAL SEL VERTEBRAL UNI R MOD SED  01/04/2021   IR CT HEAD LTD  01/24/2021   IR RADIOLOGIST EVAL & MGMT  12/22/2020   IR RADIOLOGIST EVAL & MGMT  01/10/2021   IR RADIOLOGIST EVAL & MGMT  02/08/2021   IR TRANSCATH/EMBOLIZ  01/24/2021   IR US GUIDE VASC ACCESS RIGHT  01/04/2021   IR US GUIDE VASC ACCESS RIGHT  01/24/2021   IR US GUIDE VASC ACCESS RIGHT  11/22/2022   KNEE ARTHROSCOPY Right    RADIOLOGY WITH ANESTHESIA N/A 01/24/2021   Procedure: RADIOLOGY WITH ANESTHESIA EMBOLIZATION;  Surgeon: Julieanne Cotton, MD;  Location: [facility] OR;  Service: Radiology;  Laterality: N/A;   SKIN BIOPSY Right 03/29/2022   squamous cell carcinoma   TONSILLECTOMY AND ADENOIDECTOMY     age 23   Family History  Problem Relation Age of Onset   Stroke Mother    Hypertension Mother    Prostate cancer Father        mets   Aneurysm Father    Breast cancer Maternal Grandmother    Colon cancer Neg Hx    Stomach cancer Neg Hx    Esophageal cancer Neg Hx    Social History   Socioeconomic History   Marital status: Divorced    Spouse name: Not on file   Number of children: 2   Years of education: 12   Highest education level: High school graduate  Occupational History   Occupation: Unemployed    Comment: Prior customer service  Tobacco Use   Smoking status: Some Days    Current packs/day: 0.50    Types: Cigarettes   Smokeless tobacco: Former   Tobacco comments:    Stopped as of end of summer 2024  Vaping Use   Vaping status:  Never Used  Substance and Sexual Activity   Alcohol use: No   Drug use: Yes    Types: Marijuana    Comment: several times per week to help with anxiety   Sexual activity: Not Currently  Other Topics Concern   Not on file  Social History Narrative   Right handed   Highest level of edu- HS   Lives in one story home with two roommates   Social Determinants of Health   Financial Resource Strain: Low Risk  (  04/08/2023)   Overall Financial Resource Strain (CARDIA)    Difficulty of Paying Living Expenses: Not very hard  Food Insecurity: Food Insecurity Present (04/08/2023)   Hunger Vital Sign    Worried About Running Out of Food in the Last Year: Sometimes true    Ran Out of Food in the Last Year: Sometimes true  Transportation Needs: No Transportation Needs (04/08/2023)   PRAPARE - Administrator, Civil Service (Medical): No    Lack of Transportation (Non-Medical): No  Physical Activity: Insufficiently Active (04/08/2023)   Exercise Vital Sign    Days of Exercise per Week: 2 days    Minutes of Exercise per Session: 20 min  Stress: Stress Concern Present (04/08/2023)   [employer] of Occupational Health - Occupational Stress Questionnaire    Feeling of Stress : Rather much  Social Connections: Unknown (04/08/2023)   Social Connection and Isolation Panel [NHANES]    Frequency of Communication with Friends and Family: More than three times a week    Frequency of Social Gatherings with Friends and Family: More than three times a week    Attends Religious Services: Not on Marketing executive or Organizations: No    Attends Banker Meetings: Never    Marital Status: Divorced    Tobacco Counseling Ready to quit: Not Answered Counseling given: Not Answered Tobacco comments: Stopped as of end of summer 2024   Clinical Intake:  Pre-visit preparation completed: Yes  Pain : 0-10 Pain Score: 2  Pain Type: Chronic pain Pain Location:  Head Pain Descriptors / Indicators: Aching Pain Onset: More than a month ago Pain Frequency: Constant     Nutritional Risks: None Diabetes: No  How often do you need to have someone help you when you read instructions, pamphlets, or other written materials from your doctor or pharmacy?: 1 - Never  Interpreter Needed?: No  Information entered by :: NAllen LPN   Activities of Daily Living    04/08/2023    2:05 PM 04/15/2022    1:11 PM  In your present state of health, do you have any difficulty performing the following activities:  Hearing? 0 1  Comment  sometimes  Vision? 1 1  Comment needs to go to doctor seeing eye doctor soon  Difficulty concentrating or making decisions? 1 1  Comment has ADD   Walking or climbing stairs? 0 0  Dressing or bathing? 0 0  Doing errands, shopping? 0 0  Preparing Food and eating ? N N  Using the Toilet? N N  In the past six months, have you accidently leaked urine? Y N  Do you have problems with loss of bowel control? N N  Managing your Medications? N N  Managing your Finances? N N  Housekeeping or managing your Housekeeping? N N    Patient Care Team: Ronnald Nian, MD as PCP - General (Family Medicine) Van Clines, MD as Consulting Physician (Neurology) Zettie Pho, [facility] (Inactive) as Pharmacist (Pharmacist)  Indicate any recent Medical Services you may have received from other than [facility] providers in the past year (date may be approximate).     Assessment:   This is a routine wellness examination for Elizabeth Phelps.  Hearing/Vision screen Hearing Screening - Comments:: Denies hearing issues Vision Screening - Comments:: No regular eye exams, looking for eye doctor   Goals Addressed             This Visit's Progress  Patient Stated       04/08/2023, wants to join a gym and quit smoking       Depression Screen    04/08/2023    2:25 PM 12/17/2022    9:25 AM 04/15/2022    1:08 PM 12/14/2021   11:16 AM 10/15/2021    10:51 AM 12/18/2020    2:33 PM 09/07/2020   10:12 AM  PHQ 2/9 Scores  PHQ - 2 Score 3 0 0 5 0 2   PHQ- 9 Score 3 3  8  10       Information is confidential and restricted. Go to Review Flowsheets to unlock data.    Fall Risk    04/08/2023    2:05 PM 12/17/2022    2:40 PM 04/17/2022    3:06 PM 04/15/2022    1:07 PM 12/14/2021   11:13 AM  Fall Risk   Falls in the past year? 1 1 1 1 1   Comment turned to quick    trips  Number falls in past yr: 1 1 0 1 1  Injury with Fall? 1 1 1 1 1   Comment    dislocated knee cap in May   Risk for fall due to : Medication side effect   Other (Comment) Impaired balance/gait;Medication side effect;Impaired mobility  Follow up Falls prevention discussed;Falls evaluation completed Falls evaluation completed  Falls evaluation completed Falls evaluation completed;Education provided;Falls prevention discussed    MEDICARE RISK AT HOME: Medicare Risk at Home Any stairs in or around the home?: No If so, are there any without handrails?: Yes Home free of loose throw rugs in walkways, pet beds, electrical cords, etc?: No Adequate lighting in your home to reduce risk of falls?: Yes Life alert?: No Use of a cane, walker or w/c?: No Grab bars in the bathroom?: Yes Shower chair or bench in shower?: No Elevated toilet seat or a handicapped toilet?: No  TIMED UP AND GO:  Was the test performed?  No    Cognitive Function:    08/04/2018   12:26 PM  MMSE - Mini Mental State Exam  Orientation to time 4  Orientation to Place 5  Registration 3  Attention/ Calculation 5  Recall 2  Language- name 2 objects 2  Language- repeat 1  Language- follow 3 step command 3  Language- read & follow direction 1  Write a sentence 1  Copy design 1  Total score 28      09/08/2018    2:00 PM  Montreal Cognitive Assessment   Visuospatial/ Executive (0/5) 3  Naming (0/3) 3  Attention: Read list of digits (0/2) 2  Attention: Read list of letters (0/1) 0  Attention:  Serial 7 subtraction starting at 100 (0/3) 3  Language: Repeat phrase (0/2) 0  Language : Fluency (0/1) 0  Abstraction (0/2) 1  Delayed Recall (0/5) 3  Orientation (0/6) 6  Total 21  Adjusted Score (based on education) 22      04/08/2023    2:27 PM 04/15/2022    1:08 PM 12/14/2021   11:24 AM  6CIT Screen  What Year? 0 points 0 points 0 points  What month? 0 points 0 points 0 points  What time? 0 points 0 points 0 points  Count back from 20 0 points 0 points 0 points  Months in reverse 0 points 0 points 0 points  Repeat phrase 4 points 0 points 4 points  Total Score 4 points 0 points 4 points    Immunizations Immunization History  Administered Date(s) Administered   Fluad Quad(high Dose 65+) 07/16/2021   Hepb-cpg 06/11/2018, 07/14/2018   Influenza,inj,Quad PF,6+ Mos 06/10/2019   PFIZER(Purple Top)SARS-COV-2 Vaccination 01/26/2020, 02/17/2020   Tdap 07/27/2015    TDAP status: Up to date  Flu Vaccine status: Due, Education has been provided regarding the importance of this vaccine. Advised may receive this vaccine at local pharmacy or Health Dept. Aware to provide a copy of the vaccination record if obtained from local pharmacy or Health Dept. Verbalized acceptance and understanding.  Pneumococcal vaccine status: Due, Education has been provided regarding the importance of this vaccine. Advised may receive this vaccine at local pharmacy or Health Dept. Aware to provide a copy of the vaccination record if obtained from local pharmacy or Health Dept. Verbalized acceptance and understanding.  Covid-19 vaccine status: Declined, Education has been provided regarding the importance of this vaccine but patient still declined. Advised may receive this vaccine at local pharmacy or Health Dept.or vaccine clinic. Aware to provide a copy of the vaccination record if obtained from local pharmacy or Health Dept. Verbalized acceptance and understanding.  Qualifies for Shingles Vaccine? Yes    Zostavax completed No   Shingrix Completed?: No.    Education has been provided regarding the importance of this vaccine. Patient has been advised to call insurance company to determine out of pocket expense if they have not yet received this vaccine. Advised may also receive vaccine at local pharmacy or Health Dept. Verbalized acceptance and understanding.  Screening Tests Health Maintenance  Topic Date Due   Pneumonia Vaccine 36+ Years old (1 of 2 - PCV) Never done   Zoster Vaccines- Shingrix (1 of 2) Never done   COVID-19 Vaccine (3 - Pfizer risk series) 03/16/2020   MAMMOGRAM  12/17/2022   INFLUENZA VACCINE  09/22/2023 (Originally 01/23/2023)   Medicare Annual Wellness (AWV)  04/07/2024   DTaP/Tdap/Td (2 - Td or Tdap) 07/26/2025   Colonoscopy  11/06/2027   DEXA SCAN  Completed   Hepatitis C Screening  Completed   HPV VACCINES  Aged Out    Health Maintenance  Health Maintenance Due  Topic Date Due   Pneumonia Vaccine 52+ Years old (1 of 2 - PCV) Never done   Zoster Vaccines- Shingrix (1 of 2) Never done   COVID-19 Vaccine (3 - Pfizer risk series) 03/16/2020   MAMMOGRAM  12/17/2022    Colorectal cancer screening: Type of screening: Colonoscopy. Completed 11/05/2017. Repeat every 10 years  Mammogram status: patient to schedule  Bone Density status: Completed 04/04/2022.   Lung Cancer Screening: (Low Dose CT Chest recommended if Age 74-80 years, 20 pack-year currently smoking OR have quit w/in 15years.) does not qualify.   Lung Cancer Screening Referral: no  Additional Screening:  Hepatitis C Screening: does qualify; Completed 09/22/2017  Vision Screening: Recommended annual ophthalmology exams for early detection of glaucoma and other disorders of the eye. Is the patient up to date with their annual eye exam?  No  Who is the provider or what is the name of the office in which the patient attends annual eye exams? none If pt is not established with a provider, would they  like to be referred to a provider to establish care? No .   Dental Screening: Recommended annual dental exams for proper oral hygiene  Diabetic Foot Exam: n/a  Community Resource Referral / Chronic Care Management: CRR required this visit?  No   CCM required this visit?  No     Plan:  I have personally reviewed and noted the following in the patient's chart:   Medical and social history Use of alcohol, tobacco or illicit drugs  Current medications and supplements including opioid prescriptions. Patient is not currently taking opioid prescriptions. Functional ability and status Nutritional status Physical activity Advanced directives List of other physicians Hospitalizations, surgeries, and ER visits in previous 12 months Vitals Screenings to include cognitive, depression, and falls Referrals and appointments  In addition, I have reviewed and discussed with patient certain preventive protocols, quality metrics, and best practice recommendations. A written personalized care plan for preventive services as well as general preventive health recommendations were provided to patient.     Barb Merino, LPN   13/24/4010   After Visit Summary: (MyChart) Due to this being a telephonic visit, the after visit summary with patients personalized plan was offered to patient via MyChart   Nurse Notes: none

## 2023-04-08 NOTE — Patient Instructions (Signed)
Elizabeth Phelps , Thank you for taking time to come for your Medicare Wellness Visit. I appreciate your ongoing commitment to your health goals. Please review the following plan we discussed and let me know if I can assist you in the future.   Referrals/Orders/Follow-Ups/Clinician Recommendations: none  This is a list of the screening recommended for you and due dates:  Health Maintenance  Topic Date Due   Pneumonia Vaccine (1 of 2 - PCV) Never done   Zoster (Shingles) Vaccine (1 of 2) Never done   COVID-19 Vaccine (3 - Pfizer risk series) 03/16/2020   Mammogram  12/17/2022   Flu Shot  09/22/2023*   Medicare Annual Wellness Visit  04/07/2024   DTaP/Tdap/Td vaccine (2 - Td or Tdap) 07/26/2025   Colon Cancer Screening  11/06/2027   DEXA scan (bone density measurement)  Completed   Hepatitis C Screening  Completed   HPV Vaccine  Aged Out  *Topic was postponed. The date shown is not the original due date.    Advanced directives: (ACP Link)Information on Advanced Care Planning can be found at [facility] Advance Health Care Directives Advance Health Care Directives ([email or web address]   Next Medicare Annual Wellness Visit scheduled for next year: Yes  Insert Preventive Care attachment Insert FALL PREVENTION attachment if needed

## 2023-04-16 ENCOUNTER — Other Ambulatory Visit: Payer: Self-pay | Admitting: Family Medicine

## 2023-04-16 DIAGNOSIS — F411 Generalized anxiety disorder: Secondary | ICD-10-CM

## 2023-04-16 NOTE — Telephone Encounter (Signed)
Is this okay to refill? 

## 2023-05-06 ENCOUNTER — Other Ambulatory Visit (INDEPENDENT_AMBULATORY_CARE_PROVIDER_SITE_OTHER): Payer: 59

## 2023-05-06 DIAGNOSIS — Z23 Encounter for immunization: Secondary | ICD-10-CM | POA: Diagnosis not present

## 2023-05-13 ENCOUNTER — Other Ambulatory Visit: Payer: Self-pay | Admitting: Family Medicine

## 2023-05-13 DIAGNOSIS — F411 Generalized anxiety disorder: Secondary | ICD-10-CM

## 2023-05-13 NOTE — Telephone Encounter (Signed)
Is this okay to refill? 

## 2023-05-14 ENCOUNTER — Other Ambulatory Visit: Payer: Self-pay | Admitting: Family Medicine

## 2023-05-14 DIAGNOSIS — I700 Atherosclerosis of aorta: Secondary | ICD-10-CM

## 2023-05-25 ENCOUNTER — Other Ambulatory Visit: Payer: Self-pay | Admitting: Gastroenterology

## 2023-06-09 ENCOUNTER — Telehealth (INDEPENDENT_AMBULATORY_CARE_PROVIDER_SITE_OTHER): Payer: 59 | Admitting: Medical

## 2023-06-09 DIAGNOSIS — R062 Wheezing: Secondary | ICD-10-CM | POA: Diagnosis not present

## 2023-06-09 DIAGNOSIS — R051 Acute cough: Secondary | ICD-10-CM | POA: Diagnosis not present

## 2023-06-09 DIAGNOSIS — J988 Other specified respiratory disorders: Secondary | ICD-10-CM | POA: Diagnosis not present

## 2023-06-09 MED ORDER — GUAIFENESIN ER 600 MG PO TB12: 600.0000 mg | ORAL_TABLET | Freq: Two times a day (BID) | ORAL | 0 refills | Status: DC

## 2023-06-09 MED ORDER — BENZONATATE 200 MG PO CAPS: 200.0000 mg | ORAL_CAPSULE | Freq: Three times a day (TID) | ORAL | 0 refills | Status: DC | PRN

## 2023-06-09 MED ORDER — AZITHROMYCIN 250 MG PO TABS: ORAL_TABLET | ORAL | 0 refills | Status: DC

## 2023-06-09 NOTE — Progress Notes (Signed)
Subjective:     Patient ID: Elizabeth Phelps, female   DOB: 1956/05/16, 67 y.o.   MRN: [id number]  This visit type was conducted due to national recommendations for restrictions regarding the COVID-19 Pandemic (e.g. social distancing) in an effort to limit this patient's exposure and mitigate transmission in our community.  Due to their co-morbid illnesses, this patient is at least at moderate risk for complications without adequate follow up.  This format is felt to be most appropriate for this patient at this time.    Documentation for virtual audio and video telecommunications through [place] encounter:  The patient was located at home. The provider was located in the office. The patient did consent to this visit and is aware of possible charges through their insurance for this visit.  The other persons participating in this telemedicine service were none. Time spent on call was 20 minutes and in review of previous records 20 minutes total.  This virtual service is not related to other E/M service within previous 7 days.   HPI Chief Complaint  Patient presents with   sick    Sick since last Wednesday, symptoms cough, running fever, severe congestion. Feels like bronchitis. Covid tested negative on Thursday.    Virtual consult for illness.  She has had about a week of symptoms including heavy chest congestion, cough, wheezing, cough throughout the day and night, quite a bit congestion in the chest, at times felt feverish, has had some aches.  Nausea at times.  No vomiting.  Has been wheezy.  No head congestion though.  No sore throat.  Has had some sweats.  He only sick contact with a grandson with an ear infection.  She did a COVID test at home last week which was negative.  Using some DayQuil, using her inhaler once daily.  No other aggravating or relieving factors. No other complaint.   Past Medical History:  Diagnosis Date   Anemia    Arnold-Chiari malformation, type I     Atherosclerosis of aorta 10/02/2017   Attention and concentration deficit 09/02/2019   traits of ADHD at very least   Autoimmune hepatitis 08/04/2018   Patient with PBC and autoimmune hepatitis overlap on previous biopsy.   Patient has a negative AMA, ANA was positive in a dual nuclear and speckled pattern with a titer of 1:1280, ASMA was weakly positive at 27, and a mildly elevated IgG. Biopsy reported mild to moderate interface hepatitis.  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   Complication of anesthesia    hard to wake up after brain surgery in 2002   Diverticulosis 07/21/2020   Dysthymia 04/03/2012   Generalized anxiety disorder    History of colonic polyps 08/12/2016   Hyperlipidemia 08/12/2016   LDL goal <100   Incomplete right bundle branch block 09/22/2017   Major depressive disorder    Migraine headache    Primary biliary cholangitis 08/04/2018   Primary biliary cirrhosis    Smoker    Current Outpatient Medications on File Prior to Visit  Medication Sig Dispense Refill   albuterol (VENTOLIN HFA) 108 (90 Base) MCG/ACT inhaler Inhale 2 puffs into the lungs every 6 (six) hours as needed for wheezing or shortness of breath. 18 g 0   aspirin EC 81 MG tablet Take 81 mg by mouth daily. Swallow whole.     atorvastatin (LIPITOR) 40 MG tablet Take 1  tablet by mouth once daily 90 tablet 0   azaTHIOprine (IMURAN) 50 MG tablet Take 100 mg by mouth at bedtime.     clopidogrel (PLAVIX) 75 MG tablet Take 1 tablet (75 mg total) by mouth daily. 90 tablet 3   ibuprofen (ADVIL) 200 MG tablet Take 400 mg by mouth every 6 (six) hours as needed for headache.     Multiple Vitamin (MULTIVITAMIN WITH MINERALS) TABS Take 1 tablet by mouth at bedtime.     OCALIVA 5 MG TABS Take 5 mg by mouth at bedtime.     omeprazole (PRILOSEC) 20 MG capsule TAKE 1 CAPSULE BY MOUTH EVERY OTHER DAY 45 capsule 0    PARoxetine (PAXIL) 20 MG tablet Take 1 tablet by mouth once daily 30 tablet 0   polyethylene glycol (MIRALAX / GLYCOLAX) 17 g packet Take 17 g by mouth daily.     No current facility-administered medications on file prior to visit.     Review of Systems As in subjective    Objective:   Physical Exam There were no vitals taken for this visit.  Due to coronavirus pandemic stay at home measures, patient visit was virtual and they were not examined in person.   Gen: wd, wn nad Occasional cough, no labored breathing or wheezing     Assessment:     Encounter Diagnoses  Name Primary?   Acute cough Yes   Respiratory tract infection    Wheezing        Plan:     Advised rest, good hydration, increase albuterol to 2-3 times daily short-term this week, begin medications below, if worse or not much improved within the next 72 hours then call or recheck  Jovonne was seen today for sick.  Diagnoses and all orders for this visit:  Acute cough  Respiratory tract infection  Wheezing  Other orders -     azithromycin (ZITHROMAX) 250 MG tablet; 2 tablets day 1, then 1 tablet days 2-4 -     benzonatate (TESSALON) 200 MG capsule; Take 1 capsule (200 mg total) by mouth 3 (three) times daily as needed for cough. -     guaiFENesin (MUCINEX) 600 MG 12 hr tablet; Take 1 tablet (600 mg total) by mouth 2 (two) times daily.  F/u prn

## 2023-06-14 ENCOUNTER — Other Ambulatory Visit: Payer: Self-pay | Admitting: Family Medicine

## 2023-06-14 DIAGNOSIS — F411 Generalized anxiety disorder: Secondary | ICD-10-CM

## 2023-07-11 ENCOUNTER — Encounter: Payer: Self-pay | Admitting: Neurology

## 2023-07-11 ENCOUNTER — Ambulatory Visit (INDEPENDENT_AMBULATORY_CARE_PROVIDER_SITE_OTHER): Payer: 59 | Admitting: Neurology

## 2023-07-11 VITALS — BP 111/71 | HR 88 | Ht 63.5 in | Wt 150.4 lb

## 2023-07-11 DIAGNOSIS — G43109 Migraine with aura, not intractable, without status migrainosus: Secondary | ICD-10-CM

## 2023-07-11 NOTE — Progress Notes (Unsigned)
NEUROLOGY FOLLOW UP OFFICE NOTE  Elizabeth Phelps [id number] 1955/12/26  HISTORY OF PRESENT ILLNESS: I had the pleasure of seeing Elizabeth Phelps in follow-up in the neurology clinic on 07/11/2023.  The patient was last seen 7 months ago. She is alone in the office today. Records and images were personally reviewed where available.  Since her last visit, she reports she has been feeling pretty good up until recently. She had been dealing with Covid, then bronchitis, and now having some congestion. She was previously reporting dizziness, this had resolved but with current congestion, she has had some dizziness at night when she first lays down. She closes eyes and symptoms resolve. She has not been having a lot of headaches, she notices the main trigger is stress. Her son and his family have been staying with her and when her anxiety goes up, the pounding headache starts. She will be calling Dr. Fatima Phelps office for aneurysm follow-up.    History on Initial Assessment 09/08/2018: This is a pleasant 68 year old right-handed woman with a history of hyperlipidemia, tobacco use, anxiety, migraine with aura, right MCA aneurysm s/p craniotomy, presenting for evaluation of worsening memory. She started having memory issues after her brain surgery in 2002, however she continued to function quite well and states she has always been on top of things until around October 2019. At that time, she started noticing word-finding difficulties, Elizabeth Phelps has noticed this as well, she would stop mid-sentence unable to remember a word. She would hear something but would not comprehend what people are saying, asking them to repeat it several times. She feels like her brain is "short-circuiting." She reports her long-term memory is "awesome" but she cannot remember what she ate yesterday. She has gotten lost driving and uses her GPS all the time even for familiar roads. 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 There were 3 days last week where she was driving to work then she suddenly realizes she is there but does not remember the trip. Elizabeth Phelps was talking to her one time while patient was driving then realized she was at work, and did not notice any confusion. 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 She states numbers have always been her thing but now she cannot do math anymore. She lives with 2 roommates who were the first ones to notice how forgetful she has been. She has been told she is "off somewhere" or "zoning out" and would need to be called a couple of times to get her attention. She has noticed she gets frustrated and agitated more easily. She was anxious after her brain surgery in 2002 and was started on Paxil which helped, she does not feel it is helping much now.  She has also started having involuntary left hand shaking lasting a few minutes. Leg is unaffected. There is no associated speech difficulty, confusion, or weakness when she has the shaking. She has numbness and tingling in her finger tips. She has been having more headaches since October, with good response to Excedrin. She has dizziness when she gets up from bed in the morning or in the middle of the night, this is when she usually falls. Last fall was a week ago. She has noticed that  if she looks at something sometimes, she sees something different, then looks again and sees what the object actually is. No olfactory/gustatory hallucinations. She has nausea when dizzy, but also has waves of nausea throughout the day. Sleep is fine most of the time. She has not woken up with tongue bite or incontinence. Her mother had memory issues after several strokes. Her father had a history of cerebral aneurysm as well. She had a head injury in the  1980s. No alcohol use. There is no family history of seizures.   MRI brain without contrast in January 2017 showed encephalomalacia of the right temporal lobe, right lateral lenticulostriate territory chronic infarct affecting the external capsule, periventricular white matter, patulous right temporal horn. Cerebellar tonsils are 13mm below foramen magnum. MRA head showed combination fusiform and saccular aneurysm of the right MCA throughout M1 segment, appearing similar in size to angiogram in 2002.   Repeat MRI/MRA brain in 05/2019 showed no acute changes. There were post-op changes from right pterional craniotomy with underlying chronic post-operative and/or ischemic encephalomalacia in the right temporal lobe and right basal ganglia. There is a Chiari 1 malfomation with tonsils extending up to 12mm through foramen magnum, stable from prior. There is diffuse atrophy and chronic microvascular disease, mildly progressed from 2017. MRA showed combination of fusiform and saccular right MCA aneurysm originating from the right M1 segment, similar to 2017 imaging.   Neurocognitive testing was done in March 2021. There was performance variability as well as concerns surrounding validity of lower test scores. Deficits in visuoperceptual functioning are consistent with history of right temporal lobe encephalomalacia. There was no pronounced right frontal cognitive dysfunction seen. There was variability across processing speed, executive functioning, and visuospatial abilities, as well as learning and memory. She scored moderate range for acute symptoms of anxiety and depression. She also scored in the "likely to have ADHD" range across a childhood assessment of inattentive symptoms. It was noted that "There is the potential that the primary etiology of her cognitive deficits surrounds underlying traits of ADHD, exacerbated by significant psychiatric distress. This could manifest in trouble with processing speed and  receptive language, worsening her ability to problem solve, adapt to changing cognitive demands, and learn novel information. However, I cannot rule out an underlying cognitive disorder at the present time."   11/2020: suggestive of isolated deficits across verbal fluency (both phonemic and semantic), as well as fine motor coordination/speed when using her dominant (right) hand. There was also a weakness learning visually presented information. However, other aspects of learning and memory were appropriate. Performance was also appropriate across processing speed, attention/concentration, executive functioning, receptive language, confrontation naming, and visuospatial abilities. Ms. Sholtis denied difficulties completing instrumental activities of daily living (ADLs) independently.    Relative to her prior evaluation in March 2021, a large degree of stability was exhibited. Performances as a whole were less variable as previously observed. Improvements were exhibited across several tasks, including those assessing verbal learning and memory, basic attention, and visuospatial abilities. No cognitive domains exhibited a significant decline across the two evaluations.  Given stability with some mild improvement over the course of the two evaluations, I do not see compelling evidence to raise concerns for a neurodegenerative illness at the present time.   EEG done 06/2019 showed occasional slowing over the right frontal and temporal regions.   Prior headache preventative medication: gabapentin Prior headache rescue medication: imitrex, zomig, Maxalt (needed 2 doses, drowsiness)   PAST MEDICAL HISTORY: Past Medical History:  Diagnosis Date  Anemia    Arnold-Chiari malformation, type I    Atherosclerosis of aorta 10/02/2017   Attention and concentration deficit 09/02/2019   traits of ADHD at very least   Autoimmune hepatitis 08/04/2018   Patient with PBC and autoimmune hepatitis overlap on previous  biopsy.   Patient has a negative AMA, ANA was positive in a dual nuclear and speckled pattern with a titer of 1:1280, ASMA was weakly positive at 27, and a mildly elevated IgG. Biopsy reported mild to moderate interface hepatitis.  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   Complication of anesthesia    hard to wake up after brain surgery in 2002   Diverticulosis 07/21/2020   Dysthymia 04/03/2012   Generalized anxiety disorder    History of colonic polyps 08/12/2016   Hyperlipidemia 08/12/2016   LDL goal <100   Incomplete right bundle branch block 09/22/2017   Major depressive disorder    Migraine headache    Primary biliary cholangitis 08/04/2018   Primary biliary cirrhosis    Smoker     MEDICATIONS: Current Outpatient Medications on File Prior to Visit  Medication Sig Dispense Refill   albuterol (VENTOLIN HFA) 108 (90 Base) MCG/ACT inhaler Inhale 2 puffs into the lungs every 6 (six) hours as needed for wheezing or shortness of breath. 18 g 0   aspirin EC 81 MG tablet Take 81 mg by mouth daily. Swallow whole.     atorvastatin (LIPITOR) 40 MG tablet Take 1 tablet by mouth once daily 90 tablet 0   azaTHIOprine (IMURAN) 50 MG tablet Take 100 mg by mouth at bedtime.     clopidogrel (PLAVIX) 75 MG tablet Take 1 tablet (75 mg total) by mouth daily. 90 tablet 3   guaiFENesin (MUCINEX) 600 MG 12 hr tablet Take 1 tablet (600 mg total) by mouth 2 (two) times daily. 10 tablet 0   ibuprofen (ADVIL) 200 MG tablet Take 400 mg by mouth every 6 (six) hours as needed for headache.     Multiple Vitamin (MULTIVITAMIN WITH MINERALS) TABS Take 1 tablet by mouth at bedtime.     OCALIVA 5 MG TABS Take 5 mg by mouth at bedtime.     omeprazole (PRILOSEC) 20 MG capsule TAKE 1 CAPSULE BY MOUTH EVERY OTHER DAY 45 capsule 0   PARoxetine (PAXIL) 20 MG tablet Take 1 tablet by mouth once daily 30 tablet 0    polyethylene glycol (MIRALAX / GLYCOLAX) 17 g packet Take 17 g by mouth daily.     No current facility-administered medications on file prior to visit.    ALLERGIES: Allergies  Allergen Reactions   Codeine Nausea And Vomiting    FAMILY HISTORY: Family History  Problem Relation Age of Onset   Stroke Mother    Hypertension Mother    Prostate cancer Father        mets   Aneurysm Father    Breast cancer Maternal Grandmother    Colon cancer Neg Hx    Stomach cancer Neg Hx    Esophageal cancer Neg Hx     SOCIAL HISTORY: Social History   Socioeconomic History   Marital status: Divorced    Spouse name: Not on file   Number of children: 2   Years of education: 12   Highest education level: High school graduate  Occupational History   Occupation: Unemployed    Comment: Prior Clinical biochemist  Tobacco Use  Smoking status: Some Days    Current packs/day: 0.50    Types: Cigarettes   Smokeless tobacco: Former   Tobacco comments:    Stopped as of end of summer 2024  Vaping Use   Vaping status: Never Used  Substance and Sexual Activity   Alcohol use: No   Drug use: Yes    Types: Marijuana    Comment: several times per week to help with anxiety   Sexual activity: Not Currently  Other Topics Concern   Not on file  Social History Narrative   Right handed   Highest level of edu- HS   Lives in one story home with two roommates   Social Drivers of Health   Financial Resource Strain: Low Risk  (04/08/2023)   Overall Financial Resource Strain (CARDIA)    Difficulty of Paying Living Expenses: Not very hard  Food Insecurity: Food Insecurity Present (04/08/2023)   Hunger Vital Sign    Worried About Running Out of Food in the Last Year: Sometimes true    Ran Out of Food in the Last Year: Sometimes true  Transportation Needs: No Transportation Needs (04/08/2023)   PRAPARE - Administrator, Civil Service (Medical): No    Lack of Transportation (Non-Medical): No   Physical Activity: Insufficiently Active (04/08/2023)   Exercise Vital Sign    Days of Exercise per Week: 2 days    Minutes of Exercise per Session: 20 min  Stress: Stress Concern Present (04/08/2023)   [employer] of Occupational Health - Occupational Stress Questionnaire    Feeling of Stress : Rather much  Social Connections: Unknown (04/08/2023)   Social Connection and Isolation Panel [NHANES]    Frequency of Communication with Friends and Family: More than three times a week    Frequency of Social Gatherings with Friends and Family: More than three times a week    Attends Religious Services: Not on Marketing executive or Organizations: No    Attends Banker Meetings: Never    Marital Status: Divorced  Catering manager Violence: Not At Risk (04/08/2023)   Humiliation, Afraid, Rape, and Kick questionnaire    Fear of Current or Ex-Partner: No    Emotionally Abused: No    Physically Abused: No    Sexually Abused: No     PHYSICAL EXAM: Vitals:   07/11/23 1445  BP: 111/71  Pulse: 88  SpO2: 96%   General: No acute distress Head:  Normocephalic/atraumatic Skin/Extremities: No rash, no edema Neurological Exam: alert and awake. No aphasia or dysarthria. Fund of knowledge is appropriate.   Attention and concentration are normal.   Cranial nerves: Pupils equal, round. Extraocular movements intact with no nystagmus. Visual fields full.  No facial asymmetry.  Motor: Bulk and tone normal, muscle strength 5/5 throughout with no pronator drift.   Finger to nose testing intact.  Gait narrow-based and steady, able to tandem walk adequately.  Romberg negative.   IMPRESSION: This is a 68 yo RH woman with a history of  hyperlipidemia, tobacco use, anxiety, migraine with aura, right MCA aneurysm s/p craniotomy and endovascular treatment in 01/2021, who presented with memory changes and headaches. Repeat Neuropsychological evaluation in 2022 did not show any evidence  of a neurodegenerative process, subjective memory complaints likely due to underlying traits for ADHD, exacerbated by significant psychiatric distress and disrupted sleep. She reports overall doing well from memory and headache standpoint, she is not on any medication. Continue follow-up with Dr. Corliss Skains for  aneurysm follow-up. Follow-up as needed in our office, she knows to call for any changes.   Thank you for allowing me to participate in her care.  Please do not hesitate to call for any questions or concerns.    Patrcia Dolly, M.D.   CC: Dr. Susann Givens

## 2023-07-11 NOTE — Patient Instructions (Signed)
Good to see you doing better! Call Dr. Fatima Sanger office for follow-up. Follow-up as needed with me, call for any changes. Wishing you all the best!

## 2023-07-14 ENCOUNTER — Other Ambulatory Visit: Payer: Self-pay | Admitting: Family Medicine

## 2023-07-14 DIAGNOSIS — F411 Generalized anxiety disorder: Secondary | ICD-10-CM

## 2023-07-15 DIAGNOSIS — H26493 Other secondary cataract, bilateral: Secondary | ICD-10-CM | POA: Diagnosis not present

## 2023-08-13 ENCOUNTER — Other Ambulatory Visit: Payer: Self-pay | Admitting: Family Medicine

## 2023-08-13 DIAGNOSIS — I671 Cerebral aneurysm, nonruptured: Secondary | ICD-10-CM

## 2023-08-13 DIAGNOSIS — I700 Atherosclerosis of aorta: Secondary | ICD-10-CM

## 2023-08-16 ENCOUNTER — Other Ambulatory Visit: Payer: Self-pay | Admitting: Family Medicine

## 2023-08-16 DIAGNOSIS — F411 Generalized anxiety disorder: Secondary | ICD-10-CM

## 2023-08-19 ENCOUNTER — Encounter: Payer: Self-pay | Admitting: Internal Medicine

## 2023-08-29 ENCOUNTER — Other Ambulatory Visit: Payer: Self-pay | Admitting: Gastroenterology

## 2023-09-09 ENCOUNTER — Encounter: Payer: Self-pay | Admitting: Family Medicine

## 2023-09-09 ENCOUNTER — Ambulatory Visit (INDEPENDENT_AMBULATORY_CARE_PROVIDER_SITE_OTHER): Payer: 59 | Admitting: Family Medicine

## 2023-09-09 VITALS — BP 110/72 | HR 70 | Ht 63.5 in | Wt 148.2 lb

## 2023-09-09 DIAGNOSIS — H3561 Retinal hemorrhage, right eye: Secondary | ICD-10-CM | POA: Diagnosis not present

## 2023-09-09 DIAGNOSIS — I700 Atherosclerosis of aorta: Secondary | ICD-10-CM | POA: Diagnosis not present

## 2023-09-09 DIAGNOSIS — Z87891 Personal history of nicotine dependence: Secondary | ICD-10-CM

## 2023-09-09 DIAGNOSIS — R41840 Attention and concentration deficit: Secondary | ICD-10-CM

## 2023-09-09 DIAGNOSIS — K754 Autoimmune hepatitis: Secondary | ICD-10-CM | POA: Diagnosis not present

## 2023-09-09 DIAGNOSIS — I671 Cerebral aneurysm, nonruptured: Secondary | ICD-10-CM | POA: Diagnosis not present

## 2023-09-09 DIAGNOSIS — K7469 Other cirrhosis of liver: Secondary | ICD-10-CM | POA: Diagnosis not present

## 2023-09-09 DIAGNOSIS — G43109 Migraine with aura, not intractable, without status migrainosus: Secondary | ICD-10-CM

## 2023-09-09 DIAGNOSIS — G935 Compression of brain: Secondary | ICD-10-CM | POA: Diagnosis not present

## 2023-09-09 DIAGNOSIS — Z860100 Personal history of colon polyps, unspecified: Secondary | ICD-10-CM | POA: Diagnosis not present

## 2023-09-09 DIAGNOSIS — F411 Generalized anxiety disorder: Secondary | ICD-10-CM

## 2023-09-09 DIAGNOSIS — E785 Hyperlipidemia, unspecified: Secondary | ICD-10-CM | POA: Diagnosis not present

## 2023-09-09 DIAGNOSIS — Z8679 Personal history of other diseases of the circulatory system: Secondary | ICD-10-CM | POA: Insufficient documentation

## 2023-09-09 DIAGNOSIS — K743 Primary biliary cirrhosis: Secondary | ICD-10-CM

## 2023-09-09 MED ORDER — CLOPIDOGREL BISULFATE 75 MG PO TABS: 75.0000 mg | ORAL_TABLET | Freq: Every day | ORAL | 3 refills | Status: DC

## 2023-09-09 MED ORDER — ATORVASTATIN CALCIUM 40 MG PO TABS: 40.0000 mg | ORAL_TABLET | Freq: Every day | ORAL | 3 refills | Status: AC

## 2023-09-09 MED ORDER — PAROXETINE HCL 20 MG PO TABS: 20.0000 mg | ORAL_TABLET | Freq: Every day | ORAL | 3 refills | Status: DC

## 2023-09-09 NOTE — Progress Notes (Signed)
   Subjective:    Patient ID: Elizabeth Phelps, female    DOB: 1955-08-30, 68 y.o.   MRN: [id number]  HPI She is here for medication management visit.  She recently was seen by ophthalmology and now has a retinal hemorrhage and is scheduled to be seen again by specialist to help this.  She continues to be followed for her biliary cirrhosis and continues on amiodarone and [place].  She also has an underlying history of cerebral aneurysm and is trying to get scheduled for another visit to have this reevaluated .  She continues on Plavix.  She has not had any more difficulty with her migraines.  She is under a lot of stress.  Apparently one of her children and grandchildren have been living with her in a 1 bedroom apartment which has become quite stressful but they are planning to leave sometime soon.  She seems to doing fairly well on Paxil  Continues on atorvastatin as well as Plavix.  At this point the anxiety mainly related to her living situation.  She is also taking Lipitor and doing well on this.  She does occasionally use open appraisal.  She did quit smoking.   Review of Systems  All other systems reviewed and are negative.      Objective:    Physical Exam Alert and in no distress. Tympanic membranes and canals are normal. Pharyngeal area is normal. Neck is supple without adenopathy or thyromegaly. Cardiac exam shows a regular sinus rhythm without murmurs or gallops. Lungs are clear to auscultation.        Assessment & Plan:  Retinal hemorrhage of right eye  Autoimmune hepatitis - Plan: CBC with Differential/Platelet, Comprehensive metabolic panel, Lipid panel  Other cirrhosis of liver (HCC) - Plan: CBC with Differential/Platelet, Comprehensive metabolic panel, Lipid panel  Hyperlipidemia LDL goal <100  History of colonic polyps - Plan: CBC with Differential/Platelet, Comprehensive metabolic panel  Arnold-Chiari malformation, type I  Atherosclerosis of aorta - Plan:  atorvastatin (LIPITOR) 40 MG tablet  Attention and concentration deficit  Former smoker  Generalized anxiety disorder - Plan: PARoxetine (PAXIL) 20 MG tablet  Primary biliary cholangitis (HCC)  History of cerebral aneurysm  Cerebral aneurysm without rupture - Plan: clopidogrel (PLAVIX) 75 MG tablet  Migraine with aura and without status migrainosus, not intractable She will continue on her present medicationRight regimen.  Encouraged her to call to get the procedure set up to examine the aneurysm.  She will continue to be followed by GI.  Discussed the stress that she is under and since the children be gone in 2 weeks, I think she will be okay.  She is not having any more difficulty with migraines she will call if she has trouble.

## 2023-09-10 ENCOUNTER — Encounter: Payer: Self-pay | Admitting: Family Medicine

## 2023-09-10 LAB — COMPREHENSIVE METABOLIC PANEL
ALT: 11 IU/L (ref 0–32)
AST: 27 IU/L (ref 0–40)
Albumin: 4.1 g/dL (ref 3.9–4.9)
Alkaline Phosphatase: 104 IU/L (ref 44–121)
BUN/Creatinine Ratio: 17 (ref 12–28)
BUN: 11 mg/dL (ref 8–27)
Bilirubin Total: 0.5 mg/dL (ref 0.0–1.2)
CO2: 24 mmol/L (ref 20–29)
Calcium: 9.1 mg/dL (ref 8.7–10.3)
Chloride: 103 mmol/L (ref 96–106)
Creatinine, Ser: 0.63 mg/dL (ref 0.57–1.00)
Globulin, Total: 3.1 g/dL (ref 1.5–4.5)
Glucose: 82 mg/dL (ref 70–99)
Potassium: 4.3 mmol/L (ref 3.5–5.2)
Sodium: 139 mmol/L (ref 134–144)
Total Protein: 7.2 g/dL (ref 6.0–8.5)
eGFR: 97 mL/min/{1.73_m2} (ref >59)

## 2023-09-10 LAB — LIPID PANEL
Chol/HDL Ratio: 3.5 ratio (ref 0.0–4.4)
Cholesterol, Total: 186 mg/dL (ref 100–199)
HDL: 53 mg/dL (ref >39)
LDL Chol Calc (NIH): 108 mg/dL — ABNORMAL HIGH (ref 0–99)
Triglycerides: 145 mg/dL (ref 0–149)
VLDL Cholesterol Cal: 25 mg/dL (ref 5–40)

## 2023-09-10 LAB — CBC WITH DIFFERENTIAL/PLATELET
Basophils Absolute: 0 10*3/uL (ref 0.0–0.2)
Basos: 1 %
EOS (ABSOLUTE): 0.1 10*3/uL (ref 0.0–0.4)
Eos: 2 %
Hematocrit: 42.4 % (ref 34.0–46.6)
Hemoglobin: 14 g/dL (ref 11.1–15.9)
Immature Grans (Abs): 0 10*3/uL (ref 0.0–0.1)
Immature Granulocytes: 0 %
Lymphocytes Absolute: 0.7 10*3/uL (ref 0.7–3.1)
Lymphs: 14 %
MCH: 29.7 pg (ref 26.6–33.0)
MCHC: 33 g/dL (ref 31.5–35.7)
MCV: 90 fL (ref 79–97)
Monocytes Absolute: 0.6 10*3/uL (ref 0.1–0.9)
Monocytes: 10 %
Neutrophils Absolute: 3.9 10*3/uL (ref 1.4–7.0)
Neutrophils: 73 %
Platelets: 166 10*3/uL (ref 150–450)
RBC: 4.72 x10E6/uL (ref 3.77–5.28)
RDW: 13.3 % (ref 11.7–15.4)
WBC: 5.3 10*3/uL (ref 3.4–10.8)

## 2023-09-16 ENCOUNTER — Encounter (INDEPENDENT_AMBULATORY_CARE_PROVIDER_SITE_OTHER): Admitting: Ophthalmology

## 2023-09-16 DIAGNOSIS — H43813 Vitreous degeneration, bilateral: Secondary | ICD-10-CM | POA: Diagnosis not present

## 2023-09-16 DIAGNOSIS — H35371 Puckering of macula, right eye: Secondary | ICD-10-CM | POA: Diagnosis not present

## 2023-10-28 ENCOUNTER — Other Ambulatory Visit: Payer: Self-pay | Admitting: Gastroenterology

## 2023-10-29 DIAGNOSIS — K3189 Other diseases of stomach and duodenum: Secondary | ICD-10-CM | POA: Diagnosis not present

## 2023-10-29 DIAGNOSIS — K7469 Other cirrhosis of liver: Secondary | ICD-10-CM | POA: Diagnosis not present

## 2023-10-29 DIAGNOSIS — K766 Portal hypertension: Secondary | ICD-10-CM | POA: Diagnosis not present

## 2023-10-29 DIAGNOSIS — K754 Autoimmune hepatitis: Secondary | ICD-10-CM | POA: Diagnosis not present

## 2023-10-29 DIAGNOSIS — K743 Primary biliary cirrhosis: Secondary | ICD-10-CM | POA: Diagnosis not present

## 2023-10-30 ENCOUNTER — Other Ambulatory Visit: Payer: Self-pay | Admitting: Nurse Practitioner

## 2023-10-30 DIAGNOSIS — K743 Primary biliary cirrhosis: Secondary | ICD-10-CM

## 2023-10-30 DIAGNOSIS — K7469 Other cirrhosis of liver: Secondary | ICD-10-CM

## 2023-10-30 DIAGNOSIS — K754 Autoimmune hepatitis: Secondary | ICD-10-CM

## 2023-11-04 ENCOUNTER — Other Ambulatory Visit

## 2023-11-07 ENCOUNTER — Ambulatory Visit
Admission: RE | Admit: 2023-11-07 | Discharge: 2023-11-07 | Disposition: A | Discharge status: Home / Self Care | Source: Ambulatory Visit | Attending: Nurse Practitioner | Admitting: Nurse Practitioner

## 2023-11-07 DIAGNOSIS — K743 Primary biliary cirrhosis: Secondary | ICD-10-CM

## 2023-11-07 DIAGNOSIS — K769 Liver disease, unspecified: Secondary | ICD-10-CM | POA: Diagnosis not present

## 2023-11-07 DIAGNOSIS — K754 Autoimmune hepatitis: Secondary | ICD-10-CM

## 2023-11-07 DIAGNOSIS — K7469 Other cirrhosis of liver: Secondary | ICD-10-CM

## 2023-12-20 ENCOUNTER — Encounter (HOSPITAL_COMMUNITY): Payer: Self-pay | Admitting: Interventional Radiology

## 2024-02-09 DIAGNOSIS — K3189 Other diseases of stomach and duodenum: Secondary | ICD-10-CM | POA: Diagnosis not present

## 2024-02-09 DIAGNOSIS — K754 Autoimmune hepatitis: Secondary | ICD-10-CM | POA: Diagnosis not present

## 2024-02-09 DIAGNOSIS — K743 Primary biliary cirrhosis: Secondary | ICD-10-CM | POA: Diagnosis not present

## 2024-02-09 DIAGNOSIS — K7469 Other cirrhosis of liver: Secondary | ICD-10-CM | POA: Diagnosis not present

## 2024-02-09 DIAGNOSIS — K766 Portal hypertension: Secondary | ICD-10-CM | POA: Diagnosis not present

## 2024-02-17 ENCOUNTER — Other Ambulatory Visit: Payer: Self-pay | Admitting: Family Medicine

## 2024-02-17 DIAGNOSIS — Z1231 Encounter for screening mammogram for malignant neoplasm of breast: Secondary | ICD-10-CM

## 2024-03-24 ENCOUNTER — Ambulatory Visit: Admitting: Family Medicine

## 2024-03-24 ENCOUNTER — Ambulatory Visit

## 2024-03-30 ENCOUNTER — Encounter: Payer: Self-pay | Admitting: Family Medicine

## 2024-03-30 ENCOUNTER — Telehealth: Payer: Self-pay

## 2024-03-30 ENCOUNTER — Ambulatory Visit: Admitting: Family Medicine

## 2024-03-30 VITALS — BP 120/72 | HR 66 | Ht 64.0 in | Wt 141.4 lb

## 2024-03-30 DIAGNOSIS — F17200 Nicotine dependence, unspecified, uncomplicated: Secondary | ICD-10-CM

## 2024-03-30 DIAGNOSIS — Z23 Encounter for immunization: Secondary | ICD-10-CM | POA: Diagnosis not present

## 2024-03-30 DIAGNOSIS — Z8679 Personal history of other diseases of the circulatory system: Secondary | ICD-10-CM | POA: Diagnosis not present

## 2024-03-30 DIAGNOSIS — Z87891 Personal history of nicotine dependence: Secondary | ICD-10-CM

## 2024-03-30 DIAGNOSIS — K754 Autoimmune hepatitis: Secondary | ICD-10-CM

## 2024-03-30 DIAGNOSIS — F411 Generalized anxiety disorder: Secondary | ICD-10-CM

## 2024-03-30 DIAGNOSIS — T07XXXA Unspecified multiple injuries, initial encounter: Secondary | ICD-10-CM

## 2024-03-30 NOTE — Telephone Encounter (Signed)
 Copied from CRM #[id number]. Topic: General - Call Back - No Documentation >> Mar 30, 2024  3:51 PM Berwyn MATSU wrote: Reason for CRM: Patient called back stating she missed a call and would like to know if it is regarding her medications about stopping either the plavix  or asprin. Per patient no voicemail was left and would like an update.   May you please advise.     Called Patient back to advise her to quit taking Plavix  and continue baby aspirin .

## 2024-03-30 NOTE — Progress Notes (Signed)
   Subjective:    Patient ID: Elizabeth Phelps, female    DOB: [birth year], 68 y.o.   MRN: [id number]  Discussed the use of AI scribe software for clinical note transcription with the patient, who gave verbal consent to proceed.  History of Present Illness   Elizabeth Phelps is a 68 year old female who presents with questions regarding her blood thinner medication.  She is seeking clarification about her current medication regimen, specifically regarding the blood thinner she is on. She is unsure about who initially prescribed the blood thinner and mentions that it was prescribed by the doctor who performed her angiogram.  Additionally, she is taking a baby aspirin  alongside the blood thinner.     The stents were placed in August 2022.  Also yesterday and today apparently she sustained an injury to her left lower lip as well as left elbow and knee area.      Review of Systems     Objective:    Physical Exam Alert and in no don left knee exam again has an abrasion and some slight erythema but is not hot red or tender.  Istress.  Exam of the left leg does show some swelling and slight very superficial laceration but it did not go through the vermilion border.  Left elbow does show a healing abras              Assessment & Plan:  Assessment and Plan    Anticoagulation management She is on anticoagulation therapy post-angiogram. Management requires balancing cardiovascular benefits against bleeding risks. - Review specific anticoagulant and indication. - Evaluate cardiovascular history and bleeding risk. - Discuss risks and benefits of current anticoagulation therapy.   Case was discussed with interventional radiology and they recommend stopping the Plavix .  Continue on aspirin .  Again discussed the fact that the Plavix  and aspirin  have made her more likely to have contusions and abrasions. Flu shot given.

## 2024-04-01 ENCOUNTER — Ambulatory Visit

## 2024-05-10 ENCOUNTER — Other Ambulatory Visit: Payer: Self-pay | Admitting: Nurse Practitioner

## 2024-05-10 DIAGNOSIS — K754 Autoimmune hepatitis: Secondary | ICD-10-CM

## 2024-05-10 DIAGNOSIS — K743 Primary biliary cirrhosis: Secondary | ICD-10-CM

## 2024-05-10 DIAGNOSIS — K7469 Other cirrhosis of liver: Secondary | ICD-10-CM

## 2024-05-18 ENCOUNTER — Other Ambulatory Visit

## 2024-05-18 LAB — HM MAMMOGRAPHY

## 2024-05-19 ENCOUNTER — Other Ambulatory Visit

## 2024-05-25 ENCOUNTER — Encounter: Admitting: Family Medicine

## 2024-05-31 ENCOUNTER — Encounter: Payer: Self-pay | Admitting: Family Medicine

## 2024-06-01 ENCOUNTER — Other Ambulatory Visit

## 2024-06-02 ENCOUNTER — Other Ambulatory Visit: Payer: Self-pay

## 2024-06-02 ENCOUNTER — Encounter: Payer: Self-pay | Admitting: Family Medicine

## 2024-06-02 ENCOUNTER — Telehealth: Payer: Self-pay | Admitting: Family Medicine

## 2024-06-03 ENCOUNTER — Ambulatory Visit: Admitting: Family Medicine

## 2024-06-03 ENCOUNTER — Encounter: Payer: Self-pay | Admitting: Family Medicine

## 2024-06-03 VITALS — BP 138/80 | HR 76 | Ht 63.5 in | Wt 140.2 lb

## 2024-06-03 DIAGNOSIS — Z9889 Other specified postprocedural states: Secondary | ICD-10-CM | POA: Insufficient documentation

## 2024-06-03 DIAGNOSIS — Z23 Encounter for immunization: Secondary | ICD-10-CM

## 2024-06-03 DIAGNOSIS — R296 Repeated falls: Secondary | ICD-10-CM

## 2024-06-03 DIAGNOSIS — Z8669 Personal history of other diseases of the nervous system and sense organs: Secondary | ICD-10-CM | POA: Insufficient documentation

## 2024-06-03 DIAGNOSIS — H539 Unspecified visual disturbance: Secondary | ICD-10-CM

## 2024-06-03 NOTE — Progress Notes (Signed)
° °  Subjective:    Patient ID: Elizabeth Phelps, female    DOB: 1955/10/10, 68 y.o.   MRN: [id number]  Discussed the use of AI scribe software for clinical note transcription with the patient, who gave verbal consent to proceed.  History of Present Illness  History of Present Illness Elizabeth Phelps is a 68 year old female with cerebral aneurysms and stents who presents with dizziness and falls.  She has been experiencing dizziness for approximately six months, which has worsened over the last couple of months. The dizziness primarily occurs when standing or walking and is exacerbated by turning her head side to side. Focusing on a fixed point can temporarily alleviate the dizziness. The dizziness has led to multiple falls, one of which resulted in stitches in her chin.  She reports experiencing 'brain fog' and difficulty finding words, describing it as a disconnect between her thoughts and speech. This is accompanied by memory issues, although she is uncertain about the onset of these symptoms.  Additionally, she mentions persistent nausea associated with the dizziness, though the duration is unspecified.  Recently, she has developed blurred vision in her left eye, described as seeing lines and fog, which started about three weeks ago. She has not yet consulted an eye doctor for this issue. She has a previous history of migraine headaches.  She also has a history of cerebral aneurysm embolization. I found it very difficult for her to stay on task to give me a coherent history.   Review of Systems     Objective:    Physical Exam Alert and in no distress.  Visual fields seem to be intact bilaterally.  EOMI.  Other cranial nerves grossly intact DTRs normal.  She did complain of dizziness with rotation of the neck to the left and to the right.  No nystagmus was noted.       Assessment & Plan:   Assessment & Plan   Assessment and Plan Dizziness, falls, memory loss, and  nausea Chronic dizziness with recent exacerbation, associated with falls, memory loss, and nausea. Symptoms worsened since January. Differential includes migraine history and cerebral aneurysms with stents. - Continue follow-up with neurologist, Dr. Georjean, on March 19th.  Visual disturbance, left eye New onset blurred vision in the left eye with lines, started approximately three weeks ago. - Referred to ophthalmology for evaluation of left eye visual disturbance.  Cerebral aneurysms with brain stents Cerebral aneurysms with stents placed by Dr. Dolphus. Not on Plavix  due to fall risk. - Continue follow-up with Dr. Dolphus for aneurysm management.

## 2024-06-09 ENCOUNTER — Inpatient Hospital Stay: Admission: RE | Admit: 2024-06-09 | Discharge: 2024-06-09 | Attending: Nurse Practitioner

## 2024-06-09 DIAGNOSIS — K743 Primary biliary cirrhosis: Secondary | ICD-10-CM

## 2024-06-09 DIAGNOSIS — K7469 Other cirrhosis of liver: Secondary | ICD-10-CM

## 2024-06-09 DIAGNOSIS — K754 Autoimmune hepatitis: Secondary | ICD-10-CM

## 2024-06-09 NOTE — Telephone Encounter (Signed)
dt ?

## 2024-06-14 ENCOUNTER — Ambulatory Visit (INDEPENDENT_AMBULATORY_CARE_PROVIDER_SITE_OTHER): Admitting: Family Medicine

## 2024-06-14 ENCOUNTER — Encounter: Payer: Self-pay | Admitting: Family Medicine

## 2024-06-14 VITALS — BP 124/82 | HR 71 | Ht 63.0 in | Wt 140.0 lb

## 2024-06-14 DIAGNOSIS — F411 Generalized anxiety disorder: Secondary | ICD-10-CM

## 2024-06-14 DIAGNOSIS — Z0000 Encounter for general adult medical examination without abnormal findings: Secondary | ICD-10-CM

## 2024-06-14 DIAGNOSIS — K743 Primary biliary cirrhosis: Secondary | ICD-10-CM

## 2024-06-14 DIAGNOSIS — Z136 Encounter for screening for cardiovascular disorders: Secondary | ICD-10-CM | POA: Diagnosis not present

## 2024-06-14 DIAGNOSIS — Z8659 Personal history of other mental and behavioral disorders: Secondary | ICD-10-CM

## 2024-06-14 DIAGNOSIS — H539 Unspecified visual disturbance: Secondary | ICD-10-CM

## 2024-06-14 LAB — HEMOGLOBIN A1C
Est. average glucose Bld gHb Est-mCnc: 111 mg/dL
Hgb A1c MFr Bld: 5.5 % (ref 4.8–5.6)

## 2024-06-14 LAB — LIPID PANEL
Chol/HDL Ratio: 3 ratio (ref 0.0–4.4)
Cholesterol, Total: 178 mg/dL (ref 100–199)
HDL: 59 mg/dL
LDL Chol Calc (NIH): 94 mg/dL (ref 0–99)
Triglycerides: 142 mg/dL (ref 0–149)
VLDL Cholesterol Cal: 25 mg/dL (ref 5–40)

## 2024-06-14 LAB — THYROID CASCADE PROFILE: TSH: 3.57 u[IU]/mL (ref 0.450–4.500)

## 2024-06-14 MED ORDER — PAROXETINE HCL 20 MG PO TABS: 20.0000 mg | ORAL_TABLET | Freq: Every day | ORAL | 3 refills | Status: AC

## 2024-06-14 NOTE — Progress Notes (Addendum)
 "  Name: Elizabeth Phelps   Date of Visit: 06/14/2024   Date of last visit with me: Visit date not found   CHIEF COMPLAINT:  Chief Complaint  Patient presents with   Annual Exam    Cpe. Fasting.        HPI:  Discussed the use of AI scribe software for clinical note transcription with the patient, who gave verbal consent to proceed.  History of Present Illness   Elizabeth Phelps is a 68 year old female with primary biliary cholangitis who presents with vision changes in her left eye.  She experiences significant vision changes in her left eye, describing it as 'real blurry and foggy.' A persistent line goes up and down through her vision, accompanied by a black dot, likened to a [employer], that is constantly present. These symptoms have affected her equilibrium, making her a fall risk and limiting her mobility due to fear of falling. She has a history of cataract surgery in both eyes but cannot recall which eye was operated on first.  She has a history of primary biliary cholangitis, initially discovered due to elevated enzyme levels during a physical exam. She was referred to [facility] and subsequently to a specialist in [place], who then referred her to Dr. Cassandra at [facility]. She is currently on azathioprine  for autoimmune hepatitis and Livzeldi, following the FDA's withdrawal of approval for ursodiol .  She has a history of eight aneurysms.  She lives with her three grandchildren, aged 5, 98, and 88. She has a past of being heavily involved in her children's hockey activities, including travel and roller hockey.         OBJECTIVE:       06/14/2024    9:13 AM  Depression screen PHQ 2/9  Decreased Interest 1  Down, Depressed, Hopeless 1  PHQ - 2 Score 2  Altered sleeping 2  Tired, decreased energy 2  Change in appetite 1  Feeling bad or failure about yourself  1  Trouble concentrating 2  Moving slowly or fidgety/restless 1  Suicidal thoughts 0  PHQ-9 Score 11      BP Readings from Last 3 Encounters:  06/14/24 124/82  06/03/24 138/80  03/30/24 120/72    BP 124/82   Pulse 71   Ht 5' 3 (1.6 m)   Wt 140 lb (63.5 kg)   SpO2 97%   BMI 24.80 kg/m    Physical Exam          Physical Exam Constitutional:      Appearance: Normal appearance.  Neurological:     General: No focal deficit present.     Mental Status: She is alert and oriented to person, place, and time. Mental status is at baseline.     ASSESSMENT/PLAN:   Assessment & Plan Generalized anxiety disorder  Primary biliary cholangitis  Encounter for Medicare annual wellness exam  Annual physical exam  Vision changes  Encounter for screening for cardiovascular disorders  History of acute stress disorder    Assessment and Plan    Vision changes, left eye Blurred vision with black dot affecting balance. Differential includes ophthalmologic versus transient brain-related causes. Awaiting ophthalmology evaluation. - Ordered labs to rule out systemic causes. - Referred to ophthalmology. - Consider topical steroids if recommended by ophthalmology.  Primary biliary cholangitis Managed with Livzeldi. Labs show well-controlled markers. - Continue Livzeldi.  General Health Maintenance Blood pressure and weight well-managed. - Refilled Paxil  prescription.  -Comprehensive annual physical exam completed today. Reviewed  interval history, current medical issues, medications, allergies, and preventive care needs. Addressed all patient questions and concerns. Discussed lifestyle factors including diet, exercise, sleep, and stress management. Reviewed recommended age-appropriate screenings, labs, and vaccinations. Counseling provided on healthy habits and routine health maintenance. Follow-up as indicated based on findings and results. - PCV next year         Zekiah Coen A. Vita MD [facility] "

## 2024-06-14 NOTE — Progress Notes (Signed)
 "  Chief Complaint  Patient presents with   Annual Exam    Cpe. Fasting.      Subjective:   Elizabeth Phelps is a 68 y.o. female who presents for a Medicare Annual Wellness Visit.  Visit info / Clinical Intake: Medicare Wellness Visit Type:: Subsequent Annual Wellness Visit Persons participating in visit and providing information:: patient Medicare Wellness Visit Mode:: In-person (required for WTM) Interpreter Needed?: No Pre-visit prep was completed: yes AWV questionnaire completed by patient prior to visit?: no Living arrangements:: with family/others Patient's Overall Health Status Rating: good Typical amount of pain: some Does pain affect daily life?: no Are you currently prescribed opioids?: no  Dietary Habits and Nutritional Risks How many meals a day?: 3 Eats fruit and vegetables daily?: yes Most meals are obtained by: having others provide food In the last 2 weeks, have you had any of the following?: none Diabetic:: no  Functional Status Activities of Daily Living (to include ambulation/medication): Independent Ambulation: Independent Medication Administration: Independent Home Management (perform basic housework or laundry): Independent Manage your own finances?: yes Primary transportation is: driving Concerns about vision?: (!) yes (Dr. Octavia 1/5 having blurriness in left eye) Concerns about hearing?: (!) yes (can hear just cant make out what someone is saying, will make appt after 1st of the year.) Uses hearing aids?: no Hear whispered voice?: yes  Fall Screening Falls in the past year?: 1 Number of falls in past year: 1 Was there an injury with Fall?: 1 (Bruised chin, elbow, and knee.) Fall Risk Category Calculator: 3 Patient Fall Risk Level: High Fall Risk  Fall Risk Patient at Risk for Falls Due to: History of fall(s) Fall risk Follow up: Falls evaluation completed  Home and Transportation Safety: All rugs have non-skid backing?: yes All stairs or  steps have railings?: yes Grab bars in the bathtub or shower?: (!) no Have non-skid surface in bathtub or shower?: yes Good home lighting?: yes Regular seat belt use?: yes Hospital stays in the last year:: no  Cognitive Assessment Difficulty concentrating, remembering, or making decisions? : yes Will 6CIT or Mini Cog be Completed: yes What year is it?: 0 points (2025) What month is it?: 0 points (December) Give patient an address phrase to remember (5 components): [address] [place] About what time is it?: 0 points (after 9 am, it was 9:37) Count backwards from 20 to 1: 0 points Say the months of the year in reverse: 0 points Repeat the address phrase from earlier: 0 points 6 CIT Score: 0 points  Advance Directives (For Healthcare) Does Patient Have a Medical Advance Directive?: No Would patient like information on creating a medical advance directive?: No - Patient declined  Reviewed/Updated  Reviewed/Updated: Reviewed All (Medical, Surgical, Family, Medications, Allergies, Care Teams, Patient Goals); Surgical History; Medical History; Family History; Medications; Allergies; Care Teams; Patient Goals    Allergies (verified) Codeine   Current Medications (verified) Outpatient Encounter Medications as of 06/14/2024  Medication Sig   aspirin  EC 81 MG tablet Take 81 mg by mouth daily. Swallow whole.   atorvastatin  (LIPITOR) 40 MG tablet Take 1 tablet (40 mg total) by mouth daily.   azaTHIOprine  (IMURAN ) 50 MG tablet Take 100 mg by mouth at bedtime.   ibuprofen  (ADVIL ) 200 MG tablet Take 400 mg by mouth every 6 (six) hours as needed for headache.   LIVDELZI 10 MG CAPS Take 1 capsule by mouth daily.   Magnesium 125 MG CAPS Take 125 mg by mouth daily.  Multiple Vitamin (MULTIVITAMIN WITH MINERALS) TABS Take 1 tablet by mouth at bedtime.   PARoxetine  (PAXIL ) 20 MG tablet Take 1 tablet (20 mg total) by mouth daily.   polyethylene glycol (MIRALAX / GLYCOLAX) 17 g packet Take 17  g by mouth daily.   omeprazole  (PRILOSEC) 20 MG capsule TAKE 1 CAPSULE BY MOUTH EVERY OTHER DAY (Patient not taking: Reported on 06/14/2024)   No facility-administered encounter medications on file as of 06/14/2024.    History: Past Medical History:  Diagnosis Date   Anemia    Arnold-Chiari malformation, type I    Atherosclerosis of aorta 10/02/2017   Attention and concentration deficit 09/02/2019   traits of ADHD at very least   Autoimmune hepatitis 08/04/2018   Patient with PBC and autoimmune hepatitis overlap on previous biopsy.   Patient has a negative AMA, ANA was positive in a dual nuclear and speckled pattern with a titer of 1:1280, ASMA was weakly positive at 27, and a mildly elevated IgG. Biopsy reported mild to moderate interface hepatitis.  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   Complication of anesthesia    hard to wake up after brain surgery in 2002   Diverticulosis 07/21/2020   Dysthymia 04/03/2012   Generalized anxiety disorder    History of colonic polyps 08/12/2016   Hyperlipidemia 08/12/2016   LDL goal <100   Incomplete right bundle branch block 09/22/2017   Major depressive disorder    Migraine headache    Primary biliary cholangitis 08/04/2018   Primary biliary cirrhosis    Smoker    Past Surgical History:  Procedure Laterality Date   APPENDECTOMY     BRAIN SURGERY     BREAST EXCISIONAL BIOPSY Left    CATARACT EXTRACTION Right 2018   COLONOSCOPY  2007   Dr. Kristie   IR ANGIO INTRA EXTRACRAN SEL COM CAROTID INNOMINATE BILAT MOD SED  01/04/2021   IR ANGIO INTRA EXTRACRAN SEL COM CAROTID INNOMINATE BILAT MOD SED  11/22/2022   IR ANGIO INTRA EXTRACRAN SEL INTERNAL CAROTID UNI R MOD SED  01/24/2021   IR ANGIO VERTEBRAL SEL SUBCLAVIAN INNOMINATE UNI L MOD SED  01/04/2021   IR ANGIO VERTEBRAL SEL VERTEBRAL BILAT MOD SED  11/22/2022   IR ANGIO VERTEBRAL SEL  VERTEBRAL UNI R MOD SED  01/04/2021   IR CT HEAD LTD  01/24/2021   IR RADIOLOGIST EVAL & MGMT  12/22/2020   IR RADIOLOGIST EVAL & MGMT  01/10/2021   IR RADIOLOGIST EVAL & MGMT  02/08/2021   IR TRANSCATH/EMBOLIZ  01/24/2021   IR US  GUIDE VASC ACCESS RIGHT  01/04/2021   IR US  GUIDE VASC ACCESS RIGHT  01/24/2021   IR US  GUIDE VASC ACCESS RIGHT  11/22/2022   KNEE ARTHROSCOPY Right    RADIOLOGY WITH ANESTHESIA N/A 01/24/2021   Procedure: RADIOLOGY WITH ANESTHESIA EMBOLIZATION;  Surgeon: Dolphus Carrion, MD;  Location: [facility] OR;  Service: Radiology;  Laterality: N/A;   SKIN BIOPSY Right 03/29/2022   squamous cell carcinoma   TONSILLECTOMY AND ADENOIDECTOMY     age 3   Family History  Problem Relation Age of Onset   Stroke Mother    Hypertension Mother    Prostate cancer Father        mets   Aneurysm Father    Breast cancer Maternal Grandmother    Colon cancer Neg Hx    Stomach cancer Neg Hx  Esophageal cancer Neg Hx    Social History   Occupational History   Occupation: Unemployed    Comment: Prior customer service  Tobacco Use   Smoking status: Former    Current packs/day: 0.00    Types: Cigarettes    Quit date: 2024    Years since quitting: 1.9   Smokeless tobacco: Former   Tobacco comments:    Stopped as of end of summer 2024  Vaping Use   Vaping status: Never Used  Substance and Sexual Activity   Alcohol use: No   Drug use: Yes    Types: Marijuana    Comment: several times per week to help with anxiety   Sexual activity: Not Currently   Tobacco Counseling Counseling given: Not Answered Tobacco comments: Stopped as of end of summer 2024  SDOH Screenings   Food Insecurity: Food Insecurity Present (06/10/2024)  Housing: Low Risk (06/10/2024)  Transportation Needs: Unmet Transportation Needs (06/10/2024)  Utilities: Not At Risk (04/08/2023)  Alcohol Screen: Low Risk (04/08/2023)  Depression (PHQ2-9): High Risk (06/14/2024)  Financial Resource Strain: Medium  Risk (06/10/2024)  Physical Activity: Insufficiently Active (06/10/2024)  Social Connections: Socially Isolated (06/10/2024)  Stress: Stress Concern Present (06/10/2024)  Tobacco Use: Medium Risk (06/14/2024)  Health Literacy: Adequate Health Literacy (04/08/2023)   See flowsheets for full screening details  Depression Screen PHQ 2 & 9 Depression Scale- Over the past 2 weeks, how often have you been bothered by any of the following problems? Little interest or pleasure in doing things: 1 Feeling down, depressed, or hopeless (PHQ Adolescent also includes...irritable): 1 PHQ-2 Total Score: 2 Trouble falling or staying asleep, or sleeping too much: 2 Feeling tired or having little energy: 2 Poor appetite or overeating (PHQ Adolescent also includes...weight loss): 1 Feeling bad about yourself - or that you are a failure or have let yourself or your family down: 1 Trouble concentrating on things, such as reading the newspaper or watching television (PHQ Adolescent also includes...like school work): 2 Moving or speaking so slowly that other people could have noticed. Or the opposite - being so fidgety or restless that you have been moving around a lot more than usual: 1 Thoughts that you would be better off dead, or of hurting yourself in some way: 0 PHQ-9 Total Score: 11 If you checked off any problems, how difficult have these problems made it for you to do your work, take care of things at home, or get along with other people?: Not difficult at all     Goals Addressed   None          Objective:    Today's Vitals   06/14/24 0919  BP: 124/82  Pulse: 71  SpO2: 97%  Weight: 140 lb (63.5 kg)  Height: 5' 3 (1.6 m)   Body mass index is 24.8 kg/m.  Hearing/Vision screen No results found. Immunizations and Health Maintenance Health Maintenance  Topic Date Due   Pneumococcal Vaccine: 50+ Years (1 of 2 - PCV) Never done   Zoster Vaccines- Shingrix (1 of 2) 09/01/2024 (Originally  11/29/1974)   Medicare Annual Wellness (AWV)  06/14/2025   DTaP/Tdap/Td (2 - Td or Tdap) 07/26/2025   Mammogram  05/18/2026   Colonoscopy  11/06/2027   Influenza Vaccine  Completed   Bone Density Scan  Completed   Hepatitis C Screening  Completed   Meningococcal B Vaccine  Aged Out   Hepatitis B Vaccines 19-59 Average Risk  Discontinued   COVID-19 Vaccine  Discontinued  Assessment/Plan:  This is a routine wellness examination for Elizabeth Phelps.  Patient Care Team: Joyce Norleen BROCKS, MD as PCP - General (Family Medicine) Georjean Darice HERO, MD as Consulting Physician (Neurology) Nyle Rankin POUR, [facility] (Inactive) as Pharmacist (Pharmacist)  I have personally reviewed and noted the following in the patients chart:   Medical and social history Use of alcohol, tobacco or illicit drugs  Current medications and supplements including opioid prescriptions. Functional ability and status Nutritional status Physical activity Advanced directives List of other physicians Hospitalizations, surgeries, and ER visits in previous 12 months Vitals Screenings to include cognitive, depression, and falls Referrals and appointments  No orders of the defined types were placed in this encounter.  In addition, I have reviewed and discussed with patient certain preventive protocols, quality metrics, and best practice recommendations. A written personalized care plan for preventive services as well as general preventive health recommendations were provided to patient.   Elizabeth DELENA Bustle, MD   06/14/2024   No follow-ups on file.  After Visit Summary: (In Person-Printed) AVS printed and given to the patient  Nurse Notes: None "

## 2024-06-15 ENCOUNTER — Ambulatory Visit: Payer: Self-pay | Admitting: Family Medicine

## 2024-06-22 NOTE — Progress Notes (Signed)
 Elizabeth Phelps                                          MRN: [id number]   06/22/2024   The [facility] Quality Team Specialist reviewed this patient medical record for the purposes of chart review for care gap closure. The following were reviewed: abstraction for care gap closure-breast cancer screening.    [facility] Quality Team

## 2024-06-23 ENCOUNTER — Telehealth: Payer: Self-pay | Admitting: Family Medicine

## 2024-06-23 DIAGNOSIS — H539 Unspecified visual disturbance: Secondary | ICD-10-CM

## 2024-06-23 NOTE — Telephone Encounter (Signed)
 Copied from CRM #[id number]. Topic: Referral - Question >> Jun 23, 2024  1:25 PM [name] wrote: Reason for CRM: Patient calling stating her eye dr [facility] is advising a referral needs to be submitted to [facility] as a new requirement for 2026. Patient is scheduled with them Monday at 9am and needs this completed prior to her appointment.  Patient can be reached at [phone]

## 2024-06-28 ENCOUNTER — Telehealth: Payer: Self-pay

## 2024-06-28 DIAGNOSIS — H539 Unspecified visual disturbance: Secondary | ICD-10-CM

## 2024-06-28 NOTE — Telephone Encounter (Signed)
 Copied from CRM [id number]. Topic: Referral - Request for Referral >> Jun 28, 2024 11:39 AM Delon HERO wrote: Patient is calling to report that she missed her appointment today with [facility]. Next available appointment is 02/10/25. Patient is calling to request a referral to another office. Please advise.

## 2024-07-13 ENCOUNTER — Encounter: Payer: Self-pay | Admitting: Family Medicine

## 2024-07-13 ENCOUNTER — Ambulatory Visit: Payer: Self-pay

## 2024-07-13 ENCOUNTER — Ambulatory Visit (INDEPENDENT_AMBULATORY_CARE_PROVIDER_SITE_OTHER): Admitting: Family Medicine

## 2024-07-13 VITALS — BP 114/78 | HR 74 | Wt 138.0 lb

## 2024-07-13 DIAGNOSIS — H259 Unspecified age-related cataract: Secondary | ICD-10-CM

## 2024-07-13 DIAGNOSIS — Z77011 Contact with and (suspected) exposure to lead: Secondary | ICD-10-CM

## 2024-07-13 DIAGNOSIS — H539 Unspecified visual disturbance: Secondary | ICD-10-CM | POA: Diagnosis not present

## 2024-07-13 NOTE — Progress Notes (Signed)
 "  Name: Elizabeth Phelps   Date of Visit: 07/13/24   Date of last visit with me: 06/23/2024   CHIEF COMPLAINT:  Chief Complaint  Patient presents with   Acute Visit    Lead found in water, moved into a new house and the house is old, has well water, tested water positive for lead. Would like blood test for lead.        HPI:  Discussed the use of AI scribe software for clinical note transcription with the patient, who gave verbal consent to proceed.  History of Present Illness   Elizabeth Phelps is a 69 year old female who presents with fatigue, body aches, and headaches potentially related to lead exposure.  She has been experiencing extreme fatigue, requiring excessive sleep from 6:30 PM until 11:00 AM, with additional naps throughout the day. This excessive sleepiness has persisted since after August of last year.  She reports body aches, stating that 'even my eyebrows hurt.' These symptoms began after moving into a house in August of last year, which is approximately 12 to 69 years old. Initially, she attributed these symptoms to the flu.  She has been experiencing headaches, uncertain if they are related to her eye issues or other causes. She has a history of cataract surgery in both eyes but reports difficulty seeing out of her left eye, affecting her balance and causing nausea. She describes a 'line going up and down' and a 'constant dot' in her vision.  The household has been using bottled water due to the well water smelling like sewage, but she has been using the well water for cooking and making coffee. Her daughter-in-law conducted a lead test on the water, which showed high levels of lead. She mentions that everyone in the house has been sick since December, including the dog.  She recalls an episode where she was bedridden for two to three days with symptoms that began suddenly at 2:00 AM, leading her to cancel a medical appointment. She reports difficulty obtaining an  ophthalmology appointment due to insurance requirements and scheduling delays.         OBJECTIVE:       06/14/2024    9:13 AM  Depression screen PHQ 2/9  Decreased Interest 1  Down, Depressed, Hopeless 1  PHQ - 2 Score 2  Altered sleeping 2  Tired, decreased energy 2  Change in appetite 1  Feeling bad or failure about yourself  1  Trouble concentrating 2  Moving slowly or fidgety/restless 1  Suicidal thoughts 0  PHQ-9 Score 11     BP Readings from Last 3 Encounters:  07/13/24 114/78  06/14/24 124/82  06/03/24 138/80    BP 114/78   Pulse 74   Wt 138 lb (62.6 kg)   SpO2 97%   BMI 24.45 kg/m    Physical Exam          Physical Exam Constitutional:      Appearance: Normal appearance.  Eyes:     General: No scleral icterus.       Right eye: No discharge.        Left eye: No discharge.     Extraocular Movements: Extraocular movements intact.     Conjunctiva/sclera: Conjunctivae normal.     Pupils: Pupils are equal, round, and reactive to light.  Neurological:     General: No focal deficit present.     Mental Status: She is alert and oriented to person, place, and time. Mental status is  at baseline.     Cranial Nerves: No cranial nerve deficit.     Sensory: No sensory deficit.     Motor: No weakness.     Coordination: Coordination normal.     Gait: Gait normal.     Deep Tendon Reflexes: Reflexes normal.     ASSESSMENT/PLAN:   Assessment & Plan Lead exposure risk assessment, high risk  Visual changes  Age-related cataract of both eyes, unspecified age-related cataract type    Assessment and Plan    Suspected lead exposure with associated symptoms Symptoms suggest lead poisoning; further testing required for confirmation. - Ordered stat lead level test. - Scheduled follow-up appointment this week to review results. - Plan further blood work if lead levels are normal.  Visual disturbance following cataract surgery Persistent visual disturbance  post-surgery requires ophthalmologic evaluation. - Referred to a different ophthalmologist for evaluation. - Ensured referral processed through insurance call center.         Garron Eline A. Vita MD [facility] "

## 2024-07-13 NOTE — Telephone Encounter (Signed)
 FYI Only or Action Required?: FYI only for provider: appointment scheduled on today.  Patient was last seen in primary care on 06/14/2024 by Vita Morrow, MD.  Called Nurse Triage reporting Poisoning.  Symptoms began about a month ago.  Interventions attempted: Rest, hydration, or home remedies.  Symptoms are: unchanged.  Triage Disposition: See Physician Within 24 Hours  Patient/caregiver understands and will follow disposition?:   Reason for Triage: Patient states she has been exposed to lead in her water at home. And is having really painful headaches and extreme fatigue symptoms.  And also is wanting to ask opinions on taking charcoal for it .    Answer Assessment - Initial Assessment Questions 1. SUBSTANCE: What was swallowed? If necessary, have the caller look at the product or drug label on the container to determine active ingredients.     Lead poisoning 2. AMOUNT: How much was swallowed? (e.g., what was the possible maximum amount)      Has been living in this home for about 5 months and has now tested very high levels of lead.  3. ONSET: When was it probably swallowed? (e.g., minutes, hours ago)      Drinking and cooking with this water 4. SYMPTOMS: Do you have any symptoms? If Yes, ask: What are they? (e.g., abdomen pain, vomiting, weakness)      Lethargy, tired,  5. TREATMENT: Have you done anything to treat this? If Yes, ask: What did you do?     Is calling to ask if she should take activated charcoal 6. SUICIDAL: Did you take this to hurt or kill yourself?     Denies, accidental, in water source  Pt states that no matter how much she sleeps she is always tired. Pt states that I just don't feel good.  Protocols used: Poisoning-A-AH

## 2024-07-14 ENCOUNTER — Ambulatory Visit: Payer: Self-pay

## 2024-07-14 LAB — LEAD, BLOOD (ADULT >= 16 YRS): Lead-Whole Blood: <1 ug/dL (ref 0.0–3.4)

## 2024-09-09 ENCOUNTER — Ambulatory Visit: Admitting: Neurology

## 2024-09-15 ENCOUNTER — Encounter (INDEPENDENT_AMBULATORY_CARE_PROVIDER_SITE_OTHER): Admitting: Ophthalmology
